# Patient Record
Sex: Female | Born: 1970 | Race: White | Hispanic: No | Marital: Single | Smoking: Former smoker
Health system: Southern US, Community
[De-identification: ages and names within clinical notes are randomized; demographics above are authoritative.]

## PROBLEM LIST (undated history)

## (undated) DIAGNOSIS — Z8049 Family history of malignant neoplasm of other genital organs: Secondary | ICD-10-CM

## (undated) DIAGNOSIS — Z8052 Family history of malignant neoplasm of bladder: Secondary | ICD-10-CM

## (undated) DIAGNOSIS — Z9889 Other specified postprocedural states: Secondary | ICD-10-CM

## (undated) DIAGNOSIS — E7800 Pure hypercholesterolemia, unspecified: Secondary | ICD-10-CM

## (undated) DIAGNOSIS — D0510 Intraductal carcinoma in situ of unspecified breast: Secondary | ICD-10-CM

## (undated) DIAGNOSIS — R112 Nausea with vomiting, unspecified: Secondary | ICD-10-CM

## (undated) DIAGNOSIS — R87629 Unspecified abnormal cytological findings in specimens from vagina: Secondary | ICD-10-CM

## (undated) DIAGNOSIS — E119 Type 2 diabetes mellitus without complications: Secondary | ICD-10-CM

## (undated) DIAGNOSIS — G709 Myoneural disorder, unspecified: Secondary | ICD-10-CM

## (undated) HISTORY — DX: Unspecified abnormal cytological findings in specimens from vagina: R87.629

## (undated) HISTORY — PX: MASTECTOMY: SHX3

## (undated) HISTORY — PX: WRIST SURGERY: SHX841

## (undated) HISTORY — PX: CRYOTHERAPY: SHX1416

## (undated) HISTORY — DX: Family history of malignant neoplasm of other genital organs: Z80.49

## (undated) HISTORY — DX: Intraductal carcinoma in situ of unspecified breast: D05.10

## (undated) HISTORY — DX: Family history of malignant neoplasm of bladder: Z80.52

---

## 2004-04-13 ENCOUNTER — Emergency Department: Payer: Self-pay | Admitting: Emergency Medicine

## 2005-02-22 ENCOUNTER — Emergency Department: Payer: Self-pay | Admitting: Unknown Physician Specialty

## 2005-03-24 ENCOUNTER — Emergency Department: Payer: Self-pay | Admitting: Emergency Medicine

## 2008-10-23 ENCOUNTER — Emergency Department: Payer: Self-pay | Admitting: Unknown Physician Specialty

## 2015-05-12 ENCOUNTER — Emergency Department: Payer: Self-pay

## 2015-05-12 ENCOUNTER — Encounter: Payer: Self-pay | Admitting: Emergency Medicine

## 2015-05-12 ENCOUNTER — Emergency Department
Admission: EM | Admit: 2015-05-12 | Discharge: 2015-05-12 | Disposition: A | Discharge status: Home / Self Care | Payer: Self-pay | Attending: Emergency Medicine | Admitting: Emergency Medicine

## 2015-05-12 DIAGNOSIS — F17200 Nicotine dependence, unspecified, uncomplicated: Secondary | ICD-10-CM | POA: Insufficient documentation

## 2015-05-12 DIAGNOSIS — R600 Localized edema: Secondary | ICD-10-CM | POA: Insufficient documentation

## 2015-05-12 DIAGNOSIS — R0609 Other forms of dyspnea: Secondary | ICD-10-CM | POA: Insufficient documentation

## 2015-05-12 DIAGNOSIS — R609 Edema, unspecified: Secondary | ICD-10-CM

## 2015-05-12 LAB — CBC
HCT: 36.9 % (ref 35.0–47.0)
Hemoglobin: 12.8 g/dL (ref 12.0–16.0)
MCH: 31.5 pg (ref 26.0–34.0)
MCHC: 34.7 g/dL (ref 32.0–36.0)
MCV: 90.6 fL (ref 80.0–100.0)
PLATELETS: 205 10*3/uL (ref 150–440)
RBC: 4.08 MIL/uL (ref 3.80–5.20)
RDW: 14 % (ref 11.5–14.5)
WBC: 8.3 10*3/uL (ref 3.6–11.0)

## 2015-05-12 LAB — BASIC METABOLIC PANEL
ANION GAP: 7 (ref 5–15)
BUN: 11 mg/dL (ref 6–20)
CALCIUM: 8.5 mg/dL — AB (ref 8.9–10.3)
CO2: 26 mmol/L (ref 22–32)
Chloride: 107 mmol/L (ref 101–111)
Creatinine, Ser: 0.86 mg/dL (ref 0.44–1.00)
GFR calc Af Amer: >60 mL/min (ref >60)
GLUCOSE: 104 mg/dL — AB (ref 65–99)
POTASSIUM: 3.5 mmol/L (ref 3.5–5.1)
SODIUM: 140 mmol/L (ref 135–145)

## 2015-05-12 LAB — BRAIN NATRIURETIC PEPTIDE: B NATRIURETIC PEPTIDE 5: 51 pg/mL (ref 0.0–100.0)

## 2015-05-12 MED ORDER — FUROSEMIDE 20 MG PO TABS: 20.0000 mg | ORAL_TABLET | Freq: Every day | ORAL | Status: DC

## 2015-05-12 NOTE — ED Notes (Signed)
Pt in with co bilat ankle swelling for few days worse to left leg also co of some shob.

## 2015-05-12 NOTE — Discharge Instructions (Signed)
Peripheral Edema You have swelling in your legs (peripheral edema). This swelling is due to excess accumulation of salt and water in your body. Edema may be a sign of heart, kidney or liver disease, or a side effect of a medication. It may also be due to problems in the leg veins. Elevating your legs and using special support stockings may be very helpful, if the cause of the swelling is due to poor venous circulation. Avoid long periods of standing, whatever the cause. Treatment of edema depends on identifying the cause. Chips, pretzels, pickles and other salty foods should be avoided. Restricting salt in your diet is almost always needed. Water pills (diuretics) are often used to remove the excess salt and water from your body via urine. These medicines prevent the kidney from reabsorbing sodium. This increases urine flow. Diuretic treatment may also result in lowering of potassium levels in your body. Potassium supplements may be needed if you have to use diuretics daily. Daily weights can help you keep track of your progress in clearing your edema. You should call your caregiver for follow up care as recommended. SEEK IMMEDIATE MEDICAL CARE IF:   You have increased swelling, pain, redness, or heat in your legs.  You develop shortness of breath, especially when lying down.  You develop chest or abdominal pain, weakness, or fainting.  You have a fever.   This information is not intended to replace advice given to you by your health care provider. Make sure you discuss any questions you have with your health care provider.   Document Released: 02/03/2004 Document Revised: 03/20/2011 Document Reviewed: 07/08/2014 Elsevier Interactive Patient Education 2016 [place] Shortness of breath means you have trouble breathing. It could also mean that you have a medical problem. You should get immediate medical care for shortness of breath. CAUSES   Not enough oxygen in the  air such as with high altitudes or a smoke-filled room.  Certain lung diseases, infections, or problems.  Heart disease or conditions, such as angina or heart failure.  Low red blood cells (anemia).  Poor physical fitness, which can cause shortness of breath when you exercise.  Chest or back injuries or stiffness.  Being overweight.  Smoking.  Anxiety, which can make you feel like you are not getting enough air. DIAGNOSIS  Serious medical problems can often be found during your physical exam. Tests may also be done to determine why you are having shortness of breath. Tests may include:  Chest X-rays.  Lung function tests.  Blood tests.  An electrocardiogram (ECG).  An ambulatory electrocardiogram. An ambulatory ECG records your heartbeat patterns over a 24-hour period.  Exercise testing.  A transthoracic echocardiogram (TTE). During echocardiography, sound waves are used to evaluate how blood flows through your heart.  A transesophageal echocardiogram (TEE).  Imaging scans. Your health care provider may not be able to find a cause for your shortness of breath after your exam. In this case, it is important to have a follow-up exam with your health care provider as directed.  TREATMENT  Treatment for shortness of breath depends on the cause of your symptoms and can vary greatly. HOME CARE INSTRUCTIONS   Do not smoke. Smoking is a common cause of shortness of breath. If you smoke, ask for help to quit.  Avoid being around chemicals or things that may bother your breathing, such as paint fumes and dust.  Rest as needed. Slowly resume your usual activities.  If medicines were  prescribed, take them as directed for the full length of time directed. This includes oxygen and any inhaled medicines.  Keep all follow-up appointments as directed by your health care provider. SEEK MEDICAL CARE IF:   Your condition does not improve in the time expected.  You have a hard time  doing your normal activities even with rest.  You have any new symptoms. SEEK IMMEDIATE MEDICAL CARE IF:   Your shortness of breath gets worse.  You feel light-headed, faint, or develop a cough not controlled with medicines.  You start coughing up blood.  You have pain with breathing.  You have chest pain or pain in your arms, shoulders, or abdomen.  You have a fever.  You are unable to walk up stairs or exercise the way you normally do. MAKE SURE YOU:  Understand these instructions.  Will watch your condition.  Will get help right away if you are not doing well or get worse.   This information is not intended to replace advice given to you by your health care provider. Make sure you discuss any questions you have with your health care provider.   Document Released: 09/20/2000 Document Revised: 12/31/2012 Document Reviewed: 03/13/2011 Elsevier Interactive Patient Education [employer].

## 2015-05-12 NOTE — ED Provider Notes (Signed)
[facility] Emergency Department Provider Note   ____________________________________________  Time seen: Approximately [id number] AM  I have reviewed the triage vital signs and the nursing notes.   HISTORY  Chief Complaint Leg Swelling    HPI Heather Cain is a 45 y.o. female who comes into the hospital today with swelling in her ankles the patient reports that the swelling is worse left than right. She also has some shooting pain in her legs. The patient did have some dizziness and shortness of breath today which made her concerned. The patient reports that she went on a camping trip this weekend. She reports that she cannot walk a lot and she denies injuring herself. She was okay on Friday but felt very tired on Saturday. She reports that she's had some swelling before but this is worse than it ever been. She reports that her pain is a 2 out of 10 and is not that bad. She does have some dyspnea on exertion but no shortness of breath when she lays down. She did drink this weekend which she reports she does not drink very often. The patient wants to find out what's going on today.   History reviewed. No pertinent past medical history.  There are no active problems to display for this patient.   Past Surgical History  Procedure Laterality Date  . Wrist surgery      Current Outpatient Rx  Name  Route  Sig  Dispense  Refill  . furosemide (LASIX) 20 MG tablet   Oral   Take 1 tablet (20 mg total) by mouth daily.   20 tablet   0     Allergies Codeine  No family history on file.  Social History Social History  Substance Use Topics  . Smoking status: Current Every Day Smoker  . Smokeless tobacco: None  . Alcohol Use: Yes    Review of Systems Constitutional: No fever/chills Eyes: No visual changes. ENT: No sore throat. Cardiovascular: Denies chest pain. Respiratory: shortness of breath. Gastrointestinal: No abdominal pain.  No nausea, no  vomiting.  No diarrhea.  No constipation. Genitourinary: Negative for dysuria. Musculoskeletal: Negative for back pain. Skin: Negative for rash. Neurological: dizziness Hematological/Lymphatic:Leg swelling  10-point ROS otherwise negative.  ____________________________________________   PHYSICAL EXAM:  VITAL SIGNS: ED Triage Vitals  Enc Vitals Group     BP 05/12/15 0419 140/88 mmHg     Pulse Rate 05/12/15 0419 102     Resp 05/12/15 0419 20     Temp 05/12/15 0419 97.7 F (36.5 C)     Temp Source 05/12/15 0419 Oral     SpO2 05/12/15 0419 98 %     Weight 05/12/15 0419 260 lb (117.935 kg)     Height 05/12/15 0419 5\' 3"  (1.6 m)     Head Cir --      Peak Flow --      Pain Score --      Pain Loc --      Pain Edu? --      Excl. in [place]? --     Constitutional: Alert and oriented. Well appearing and in no acute distress. Eyes: Conjunctivae are normal. PERRL. EOMI. Head: Atraumatic. Nose: No congestion/rhinnorhea. Mouth/Throat: Mucous membranes are moist.  Oropharynx non-erythematous. Cardiovascular: Normal rate, regular rhythm. Grossly normal heart sounds.  Good peripheral circulation. Respiratory: Normal respiratory effort.  No retractions. Lungs CTAB. Gastrointestinal: Soft and nontender. No distention. Positive bowel sounds Musculoskeletal: Bilateral lower extremity edema left greater than right  Neurologic:  Normal speech and language.  Skin:  Skin is warm, dry and intact.  Psychiatric: Mood and affect are normal.   ____________________________________________   LABS (all labs ordered are listed, but only abnormal results are displayed)  Labs Reviewed  BASIC METABOLIC PANEL - Abnormal; Notable for the following:    Glucose, Bld 104 (*)    Calcium 8.5 (*)    All other components within normal limits  CBC  BRAIN NATRIURETIC PEPTIDE   ____________________________________________  EKG  None ____________________________________________  RADIOLOGY  Left lower  extremity ultrasound: No evidence of deep venous thrombosis  Chest x-ray: No active cardiopulmonary disease. ____________________________________________   PROCEDURES  Procedure(s) performed: None  Critical Care performed: No  ____________________________________________   INITIAL IMPRESSION / ASSESSMENT AND PLAN / ED COURSE  Pertinent labs & imaging results that were available during my care of the patient were reviewed by me and considered in my medical decision making (see chart for details).  This is a 45 year old female who comes into the hospital today with some shortness of breath. She reports that she's had some leg swelling as well. I did check some heart failure labs given the patient's complaints. The patient's BNP is unremarkable, her chest x-ray does not show any pulmonary edema and the remainder of her blood work is unremarkable. I feel the patient has some peripheral edema and I will give her some Lasix to take at home. I will encourage the patient to follow-up at the acute care clinic for further evaluation of her symptoms especially if they're persistent. The patient understands and agrees with the plan as stated. She'll be discharged home. ____________________________________________   FINAL CLINICAL IMPRESSION(S) / ED DIAGNOSES  Final diagnoses:  Peripheral edema  Dyspnea on exertion      NEW MEDICATIONS STARTED DURING THIS VISIT:  Discharge Medication List as of 05/12/2015  7:25 AM    START taking these medications   Details  furosemide (LASIX) 20 MG tablet Take 1 tablet (20 mg total) by mouth daily., Starting 05/12/2015, Until Thu 05/11/16, Print         Note:  This document was prepared using Dragon voice recognition software and may include unintentional dictation errors.    Loney Hering, MD 05/12/15 [phone]

## 2015-05-12 NOTE — ED Notes (Signed)
Patient given info on medication management clinic, [facility] and [facility].  Patient is in no obvious distress at this time.

## 2020-05-05 ENCOUNTER — Other Ambulatory Visit (HOSPITAL_COMMUNITY)
Admission: RE | Admit: 2020-05-05 | Discharge: 2020-05-05 | Disposition: A | Discharge status: Home / Self Care | Payer: 59 | Source: Ambulatory Visit | Attending: Student | Admitting: Student

## 2020-05-05 ENCOUNTER — Other Ambulatory Visit: Payer: Self-pay

## 2020-05-05 ENCOUNTER — Encounter (INDEPENDENT_AMBULATORY_CARE_PROVIDER_SITE_OTHER): Payer: Self-pay

## 2020-05-05 ENCOUNTER — Encounter: Payer: Self-pay | Admitting: Student

## 2020-05-05 ENCOUNTER — Ambulatory Visit (INDEPENDENT_AMBULATORY_CARE_PROVIDER_SITE_OTHER): Payer: 59 | Admitting: Student

## 2020-05-05 VITALS — BP 114/79 | HR 96 | Ht 63.0 in | Wt 325.0 lb

## 2020-05-05 DIAGNOSIS — Z01419 Encounter for gynecological examination (general) (routine) without abnormal findings: Secondary | ICD-10-CM | POA: Insufficient documentation

## 2020-05-05 MED ORDER — HYDROCORTISONE (PERIANAL) 2.5 % EX CREA: 1.0000 "application " | TOPICAL_CREAM | Freq: Two times a day (BID) | CUTANEOUS | 0 refills | Status: DC

## 2020-05-05 NOTE — Patient Instructions (Addendum)
[facility] (part of [facility]); talk about colonoscopy and hemorroid  [phone]    Managing the Challenge of Quitting Smoking Quitting smoking is a physical and mental challenge. You will face cravings, withdrawal symptoms, and temptation. Before quitting, work with your health care provider to make a plan that can help you manage quitting. Preparation can help you quit and keep you from giving in. How to manage lifestyle changes Managing stress Stress can make you want to smoke, and wanting to smoke may cause stress. It is important to find ways to manage your stress. You might try some of the following:  Practice relaxation techniques. ? Breathe slowly and deeply, in through your nose and out through your mouth. ? Listen to music. ? Soak in a bath or take a shower. ? Imagine a peaceful place or vacation.  Get some support. ? Talk with family or friends about your stress. ? Join a support group. ? Talk with a counselor or therapist.  Get some physical activity. ? Go for a walk, run, or bike ride. ? Play a favorite sport. ? Practice yoga.   Medicines Talk with your health care provider about medicines that might help you deal with cravings and make quitting easier for you. Relationships Social situations can be difficult when you are quitting smoking. To manage this, you can:  Avoid parties and other social situations where people might be smoking.  Avoid alcohol.  Leave right away if you have the urge to smoke.  Explain to your family and friends that you are quitting smoking. Ask for support and let them know you might be a bit grumpy.  Plan activities where smoking is not an option. General instructions Be aware that many people gain weight after they quit smoking. However, not everyone does. To keep from gaining weight, have a plan in place before you quit and stick to the plan after you quit. Your plan should include:  Having healthy snacks. When you have a  craving, it may help to: ? Eat popcorn, carrots, celery, or other cut vegetables. ? Chew sugar-free gum.  Changing how you eat. ? Eat small portion sizes at meals. ? Eat 4-6 small meals throughout the day instead of 1-2 large meals a day. ? Be mindful when you eat. Do not watch television or do other things that might distract you as you eat.  Exercising regularly. ? Make time to exercise each day. If you do not have time for a long workout, do short bouts of exercise for 5-10 minutes several times a day. ? Do some form of strengthening exercise, such as weight lifting. ? Do some exercise that gets your heart beating and causes you to breathe deeply, such as walking fast, running, swimming, or biking. This is very important.  Drinking plenty of water or other low-calorie or no-calorie drinks. Drink 6-8 glasses of water daily.   How to recognize withdrawal symptoms Your body and mind may experience discomfort as you try to get used to not having nicotine in your system. These effects are called withdrawal symptoms. They may include:  Feeling hungrier than normal.  Having trouble concentrating.  Feeling irritable or restless.  Having trouble sleeping.  Feeling depressed.  Craving a cigarette. To manage withdrawal symptoms:  Avoid places, people, and activities that trigger your cravings.  Remember why you want to quit.  Get plenty of sleep.  Avoid coffee and other caffeinated drinks. These may worsen some of your symptoms. These symptoms may surprise you. But  be assured that they are normal to have when quitting smoking. How to manage cravings Come up with a plan for how to deal with your cravings. The plan should include the following:  A definition of the specific situation you want to deal with.  An alternative action you will take.  A clear idea for how this action will help.  The name of someone who might help you with this. Cravings usually last for 5-10 minutes.  Consider taking the following actions to help you with your plan to deal with cravings:  Keep your mouth busy. ? Chew sugar-free gum. ? Suck on hard candies or a straw. ? Brush your teeth.  Keep your hands and body busy. ? Change to a different activity right away. ? Squeeze or play with a ball. ? Do an activity or a hobby, such as making bead jewelry, practicing needlepoint, or working with wood. ? Mix up your normal routine. ? Take a short exercise break. Go for a quick walk or run up and down stairs.  Focus on doing something kind or helpful for someone else.  Call a friend or family member to talk during a craving.  Join a support group.  Contact a quitline. Where to find support To get help or find a support group:  Call the [employer]'s Smoking Quitline: 1-800-QUIT NOW (520)332-9829)  Visit the website of the Substance Abuse and [place]: ktimeonline.com  Text QUIT to SmokefreeTXT: 250539 Where to find more information Visit these websites to find more information on quitting smoking:  [place]: www.smokefree.gov  American Lung Association: www.lung.org  American Cancer Society: www.cancer.org  Centers for Disease Control and Prevention: http://www.wolf.info/  American Heart Association: www.heart.org Contact a health care provider if:  You want to change your plan for quitting.  The medicines you are taking are not helping.  Your eating feels out of control or you cannot sleep. Get help right away if:  You feel depressed or become very anxious. Summary  Quitting smoking is a physical and mental challenge. You will face cravings, withdrawal symptoms, and temptation to smoke again. Preparation can help you as you go through these challenges.  Try different techniques to manage stress, handle social situations, and prevent weight gain.  You can deal with cravings by keeping your mouth busy (such as by chewing  gum), keeping your hands and body busy, calling family or friends, or contacting a quitline for people who want to quit smoking.  You can deal with withdrawal symptoms by avoiding places where people smoke, getting plenty of rest, and avoiding drinks with caffeine. This information is not intended to replace advice given to you by your health care provider. Make sure you discuss any questions you have with your health care provider. Document Revised: 10/15/2018 Document Reviewed: 10/15/2018 Elsevier Patient Education  [place].

## 2020-05-05 NOTE — Progress Notes (Signed)
  History:  [name] is a 50 y.o. G1P0010 who presents to clinic today for gyn. She hasn't seen a gynecologist since 2010. She had cryotherapy when she was  Teenager. She denies any diagnosis of HTN, diabetes.   The following portions of the patient's history were reviewed and updated as appropriate: allergies, current medications, family history, past medical history, social history, past surgical history and problem list.  Review of Systems:  Review of Systems  Constitutional: Negative.   HENT: Negative.   Respiratory: Negative.   Cardiovascular: Negative.   Genitourinary: Negative.   Skin: Negative.   Neurological: Negative.   Psychiatric/Behavioral: Negative.       Objective:  Physical Exam BP 114/79   Pulse 96   Ht 5\' 3"  (1.6 m)   Wt (!) 325 lb (147.4 kg)   LMP 03/27/2020 (Exact Date)   BMI 57.57 kg/m  Physical Exam HENT:     Head: Normocephalic.  Cardiovascular:     Rate and Rhythm: Normal rate.  Abdominal:     General: Abdomen is flat.  Genitourinary:    General: Normal vulva.     Vagina: No vaginal discharge.     Rectum: Normal.  Musculoskeletal:        General: Normal range of motion.     Cervical back: Normal range of motion.  Skin:    General: Skin is warm and dry.  Neurological:     Mental Status: She is alert.    Breasts are soft, non-tender, no masses, no lumps palpated One small hemorroid noted; pink, non-tender, does not appear thrombosed   Labs and Imaging No results found for this or any previous visit (from the past 24 hour(s)).  No results found.   Assessment & Plan:   1. Well woman exam with routine gynecological exam    -information on smoking cessation given -mammo ordered -she declines STI screening -pap today -patient given number for [facility] PCP for follow up for hemorroid, colonoscopy and further care -Patient given anusol for hemorroids but advised that she may need to be seen by general surgery Approximately 30  minutes of total time was spent with this patient on exam and counseling/coordination of care.   [place], [place] 05/05/2020 3:01 PM

## 2020-05-06 LAB — COMPREHENSIVE METABOLIC PANEL
ALT: 14 IU/L (ref 0–32)
AST: 10 IU/L (ref 0–40)
Albumin/Globulin Ratio: 1.4 (ref 1.2–2.2)
Albumin: 3.9 g/dL (ref 3.8–4.8)
Alkaline Phosphatase: 109 IU/L (ref 44–121)
BUN/Creatinine Ratio: 12 (ref 9–23)
BUN: 11 mg/dL (ref 6–24)
Bilirubin Total: 0.2 mg/dL (ref 0.0–1.2)
CO2: 21 mmol/L (ref 20–29)
Calcium: 8.8 mg/dL (ref 8.7–10.2)
Chloride: 102 mmol/L (ref 96–106)
Creatinine, Ser: 0.95 mg/dL (ref 0.57–1.00)
Globulin, Total: 2.8 g/dL (ref 1.5–4.5)
Glucose: 138 mg/dL — ABNORMAL HIGH (ref 65–99)
Potassium: 4.3 mmol/L (ref 3.5–5.2)
Sodium: 141 mmol/L (ref 134–144)
Total Protein: 6.7 g/dL (ref 6.0–8.5)
eGFR: 73 mL/min/{1.73_m2} (ref >59)

## 2020-05-06 LAB — LIPID PANEL
Chol/HDL Ratio: 6.1 ratio — ABNORMAL HIGH (ref 0.0–4.4)
Cholesterol, Total: 183 mg/dL (ref 100–199)
HDL: 30 mg/dL — ABNORMAL LOW (ref >39)
LDL Chol Calc (NIH): 90 mg/dL (ref 0–99)
Triglycerides: 378 mg/dL — ABNORMAL HIGH (ref 0–149)
VLDL Cholesterol Cal: 63 mg/dL — ABNORMAL HIGH (ref 5–40)

## 2020-05-06 LAB — CBC
Hematocrit: 42.4 % (ref 34.0–46.6)
Hemoglobin: 14.6 g/dL (ref 11.1–15.9)
MCH: 31.1 pg (ref 26.6–33.0)
MCHC: 34.4 g/dL (ref 31.5–35.7)
MCV: 90 fL (ref 79–97)
Platelets: 239 10*3/uL (ref 150–450)
RBC: 4.69 x10E6/uL (ref 3.77–5.28)
RDW: 13.8 % (ref 11.7–15.4)
WBC: 7.8 10*3/uL (ref 3.4–10.8)

## 2020-05-06 LAB — TSH: TSH: 1.85 u[IU]/mL (ref 0.450–4.500)

## 2020-05-06 LAB — HEMOGLOBIN A1C
Est. average glucose Bld gHb Est-mCnc: 154 mg/dL
Hgb A1c MFr Bld: 7 % — ABNORMAL HIGH (ref 4.8–5.6)

## 2020-05-10 LAB — CYTOLOGY - PAP
Comment: NEGATIVE
Diagnosis: UNDETERMINED — AB
High risk HPV: NEGATIVE

## 2020-05-11 ENCOUNTER — Ambulatory Visit
Admission: RE | Admit: 2020-05-11 | Discharge: 2020-05-11 | Disposition: A | Discharge status: Home / Self Care | Payer: 59 | Source: Ambulatory Visit | Attending: Student | Admitting: Student

## 2020-05-11 ENCOUNTER — Other Ambulatory Visit: Payer: Self-pay

## 2020-05-11 DIAGNOSIS — R921 Mammographic calcification found on diagnostic imaging of breast: Secondary | ICD-10-CM | POA: Diagnosis not present

## 2020-05-11 DIAGNOSIS — Z1231 Encounter for screening mammogram for malignant neoplasm of breast: Secondary | ICD-10-CM | POA: Diagnosis present

## 2020-05-11 DIAGNOSIS — Z01419 Encounter for gynecological examination (general) (routine) without abnormal findings: Secondary | ICD-10-CM

## 2020-05-12 ENCOUNTER — Other Ambulatory Visit: Payer: Self-pay | Admitting: Student

## 2020-05-12 ENCOUNTER — Encounter: Payer: Self-pay | Admitting: Student

## 2020-05-12 DIAGNOSIS — N6489 Other specified disorders of breast: Secondary | ICD-10-CM

## 2020-05-12 DIAGNOSIS — R921 Mammographic calcification found on diagnostic imaging of breast: Secondary | ICD-10-CM

## 2020-05-12 DIAGNOSIS — E785 Hyperlipidemia, unspecified: Secondary | ICD-10-CM | POA: Insufficient documentation

## 2020-05-12 DIAGNOSIS — R928 Other abnormal and inconclusive findings on diagnostic imaging of breast: Secondary | ICD-10-CM

## 2020-05-12 DIAGNOSIS — E119 Type 2 diabetes mellitus without complications: Secondary | ICD-10-CM | POA: Insufficient documentation

## 2020-05-12 DIAGNOSIS — E782 Mixed hyperlipidemia: Secondary | ICD-10-CM | POA: Insufficient documentation

## 2020-05-18 ENCOUNTER — Encounter: Payer: Self-pay | Admitting: Internal Medicine

## 2020-05-18 ENCOUNTER — Ambulatory Visit (INDEPENDENT_AMBULATORY_CARE_PROVIDER_SITE_OTHER): Payer: 59 | Admitting: Internal Medicine

## 2020-05-18 ENCOUNTER — Other Ambulatory Visit: Payer: Self-pay

## 2020-05-18 VITALS — BP 132/90 | HR 87 | Ht 60.0 in | Wt 324.9 lb

## 2020-05-18 DIAGNOSIS — R928 Other abnormal and inconclusive findings on diagnostic imaging of breast: Secondary | ICD-10-CM

## 2020-05-18 DIAGNOSIS — E66813 Obesity, class 3: Secondary | ICD-10-CM | POA: Insufficient documentation

## 2020-05-18 DIAGNOSIS — Z6841 Body Mass Index (BMI) 40.0 and over, adult: Secondary | ICD-10-CM | POA: Diagnosis not present

## 2020-05-18 DIAGNOSIS — E782 Mixed hyperlipidemia: Secondary | ICD-10-CM

## 2020-05-18 DIAGNOSIS — Z1211 Encounter for screening for malignant neoplasm of colon: Secondary | ICD-10-CM

## 2020-05-18 DIAGNOSIS — J329 Chronic sinusitis, unspecified: Secondary | ICD-10-CM | POA: Diagnosis not present

## 2020-05-18 DIAGNOSIS — E119 Type 2 diabetes mellitus without complications: Secondary | ICD-10-CM

## 2020-05-18 LAB — GLUCOSE, POCT (MANUAL RESULT ENTRY): POC Glucose: 183 mg/dl — AB (ref 70–99)

## 2020-05-18 LAB — POC COVID19 BINAXNOW: SARS Coronavirus 2 Ag: NEGATIVE

## 2020-05-18 NOTE — Assessment & Plan Note (Signed)
-   The patient's blood sugar is elevated on no medication - The patient will continue the current treatment regimen.  Started on metformin and antidiabetic diet patient was advised to lose weight. - I encouraged the patient to regularly check blood sugar.  Given prescription for glucometer - I encouraged the patient to monitor diet. I encouraged the patient to eat low-carb and low-sugar to help prevent blood sugar spikes.  - I encouraged the patient to continue following their prescribed treatment plan for diabetes - I informed the patient to get help if blood sugar drops below 54mg /dL, or if suddenly have trouble thinking clearly or breathing.

## 2020-05-18 NOTE — Progress Notes (Signed)
New Patient Office Visit  Subjective:  Patient ID: Heather Cain, female    DOB: Jan 07, 1971  Age: 50 y.o. MRN: [id number]  CC:  Chief Complaint  Patient presents with  . New Patient (Initial Visit)    HPI Patient presents for  As new pt, patient has a problem with exogenous obesity swelling of the left leg chronic dermatitis of the left leg.  She denies any chest pain or shortness of breath.  She is due for colonoscopy mammography has a history of hemorrhagic.  She  does not drink.  Denies any history of chest pain with exertion.  Patient has been advised to quit smoking.  She was also found to have new onset diabetes started on metformin.  Ordered a glucometer so she can check her blood sugar at home.  Past Medical History:  Diagnosis Date  . Vaginal Pap smear, abnormal      Current Outpatient Medications:  .  fluticasone (FLONASE) 50 MCG/ACT nasal spray, Place into both nostrils daily., Disp: , Rfl:  .  loratadine (CLARITIN) 10 MG tablet, Take 10 mg by mouth daily., Disp: , Rfl:    Past Surgical History:  Procedure Laterality Date  . CRYOTHERAPY    . WRIST SURGERY      Family History  Problem Relation Age of Onset  . Ovarian cancer Mother   . Breast cancer Maternal Grandmother   . Breast cancer Paternal Grandmother     Social History   Socioeconomic History  . Marital status: Single    Spouse name: Not on file  . Number of children: Not on file  . Years of education: Not on file  . Highest education level: Not on file  Occupational History  . Not on file  Tobacco Use  . Smoking status: Current Every Day Smoker  . Smokeless tobacco: Never Used  Vaping Use  . Vaping Use: Some days  Substance and Sexual Activity  . Alcohol use: Yes  . Drug use: Not Currently  . Sexual activity: Not Currently    Birth control/protection: None  Other Topics Concern  . Not on file  Social History Narrative  . Not on file   Social Determinants of Health   Financial  Resource Strain: Not on file  Food Insecurity: Not on file  Transportation Needs: Not on file  Physical Activity: Not on file  Stress: Not on file  Social Connections: Not on file  Intimate Partner Violence: Not on file    ROS Review of Systems  Constitutional: Negative.  Negative for chills and fatigue.  HENT: Negative.  Negative for ear pain.   Eyes: Negative.  Negative for visual disturbance.  Respiratory: Negative.  Negative for choking and chest tightness.   Cardiovascular: Negative.  Negative for chest pain.  Gastrointestinal: Negative.  Negative for blood in stool.  Endocrine: Negative.   Genitourinary: Negative.  Negative for pelvic pain and urgency.  Musculoskeletal: Negative.   Skin: Positive for color change.  Allergic/Immunologic: Negative.   Neurological: Negative.   Hematological: Negative.  Negative for adenopathy.  Psychiatric/Behavioral: Negative.   All other systems reviewed and are negative.   Objective:   Today's Vitals: BP 132/90   Pulse 87   Ht 5' (1.524 m)   Wt (!) 324 lb 14.4 oz (147.4 kg)   BMI 63.45 kg/m   Physical Exam HENT:     Head: Normocephalic.     Right Ear: There is no impacted cerumen.     Left Ear: Tympanic membrane  normal. There is no impacted cerumen.     Ears:     Comments: There is fluid behind the right ear tympanic membrane Cardiovascular:     Rate and Rhythm: Normal rate and regular rhythm.     Heart sounds: No murmur heard.   Pulmonary:     Effort: Pulmonary effort is normal.  Abdominal:     General: There is distension.     Tenderness: There is no abdominal tenderness. There is no guarding.  Neurological:     General: No focal deficit present.     Mental Status: She is oriented to person, place, and time.  Psychiatric:        Mood and Affect: Mood normal.     Assessment & Plan:   Problem List Items Addressed This Visit      Respiratory   Sinusitis    Patient was given Z-Pak and Claritin 10 mg p.o. daily       Relevant Orders   POC COVID-19 (Completed)     Endocrine   Diabetes ([employer]) - Primary    - The patient's blood sugar is elevated on no medication - The patient will continue the current treatment regimen.  Started on metformin and antidiabetic diet patient was advised to lose weight. - I encouraged the patient to regularly check blood sugar.  Given prescription for glucometer - I encouraged the patient to monitor diet. I encouraged the patient to eat low-carb and low-sugar to help prevent blood sugar spikes.  - I encouraged the patient to continue following their prescribed treatment plan for diabetes - I informed the patient to get help if blood sugar drops below 54mg /dL, or if suddenly have trouble thinking clearly or breathing.       Relevant Orders   POCT glucose (manual entry) (Completed)     Other   Elevated triglycerides with high cholesterol    Patient was advised to read the book on diabetes on low-carb antidiabetic diet on the Internet she definitely need to lose weight.  She was also started on Crestor 20 mg p.o. daily      Abnormality of breast on screening mammography    Advise annual mammogram      Class 3 severe obesity due to excess calories with serious comorbidity and body mass index (BMI) of 60.0 to 69.9 in adult [facility])    - I encouraged the patient to lose weight.  - I educated them on making healthy dietary choices including eating more fruits and vegetables and less fried foods. - I encouraged the patient to exercise more, and educated on the benefits of exercise including weight loss, diabetes prevention, and hypertension prevention.   Dietary counseling with a registered dietician  Referral to a weight management support group (e.g. Weight Watchers, Overeaters Anonymous)  If your BMI is greater than 29 or you have gained more than 15 pounds you should work on weight loss.  Attend a healthy cooking class          Outpatient Encounter Medications as of  05/18/2020  Medication Sig  . fluticasone (FLONASE) 50 MCG/ACT nasal spray Place into both nostrils daily.  Marland Kitchen loratadine (CLARITIN) 10 MG tablet Take 10 mg by mouth daily.  . [DISCONTINUED] furosemide (LASIX) 20 MG tablet Take 1 tablet (20 mg total) by mouth daily.  . [DISCONTINUED] hydrocortisone (ANUSOL-HC) 2.5 % rectal cream Place 1 application rectally 2 (two) times daily.   No facility-administered encounter medications on file as of 05/18/2020.    Follow-up: No  follow-ups on file.   Cletis Athens, MD

## 2020-05-18 NOTE — Assessment & Plan Note (Signed)
Patient was advised to read the book on diabetes on low-carb antidiabetic diet on the Internet she definitely need to lose weight.  She was also started on Crestor 20 mg p.o. daily

## 2020-05-18 NOTE — Assessment & Plan Note (Signed)
Patient was given Z-Pak and Claritin 10 mg p.o. daily

## 2020-05-18 NOTE — Assessment & Plan Note (Signed)
Advise annual mammogram

## 2020-05-18 NOTE — Assessment & Plan Note (Signed)

## 2020-05-20 NOTE — Addendum Note (Signed)
Addended by: Lacretia Nicks L on: 05/20/2020 09:30 AM   Modules accepted: Orders

## 2020-05-21 ENCOUNTER — Ambulatory Visit
Admission: RE | Admit: 2020-05-21 | Discharge: 2020-05-21 | Disposition: A | Discharge status: Home / Self Care | Payer: 59 | Source: Ambulatory Visit | Attending: Student | Admitting: Student

## 2020-05-21 ENCOUNTER — Other Ambulatory Visit: Payer: Self-pay

## 2020-05-21 DIAGNOSIS — N6489 Other specified disorders of breast: Secondary | ICD-10-CM | POA: Insufficient documentation

## 2020-05-21 DIAGNOSIS — R928 Other abnormal and inconclusive findings on diagnostic imaging of breast: Secondary | ICD-10-CM

## 2020-05-21 DIAGNOSIS — R921 Mammographic calcification found on diagnostic imaging of breast: Secondary | ICD-10-CM

## 2020-05-21 MED ORDER — GLIMEPIRIDE 2 MG PO TABS: 2.0000 mg | ORAL_TABLET | Freq: Every day | ORAL | 1 refills | Status: DC

## 2020-05-25 ENCOUNTER — Other Ambulatory Visit: Payer: Self-pay | Admitting: Internal Medicine

## 2020-05-28 ENCOUNTER — Ambulatory Visit: Payer: 59 | Admitting: Family Medicine

## 2020-06-03 ENCOUNTER — Encounter: Payer: Self-pay | Admitting: Family Medicine

## 2020-06-03 ENCOUNTER — Ambulatory Visit (INDEPENDENT_AMBULATORY_CARE_PROVIDER_SITE_OTHER): Payer: 59 | Admitting: Family Medicine

## 2020-06-03 ENCOUNTER — Other Ambulatory Visit: Payer: Self-pay

## 2020-06-03 VITALS — BP 118/83 | HR 84 | Ht 60.0 in | Wt 325.2 lb

## 2020-06-03 DIAGNOSIS — Z6841 Body Mass Index (BMI) 40.0 and over, adult: Secondary | ICD-10-CM

## 2020-06-03 DIAGNOSIS — E1169 Type 2 diabetes mellitus with other specified complication: Secondary | ICD-10-CM | POA: Diagnosis not present

## 2020-06-03 DIAGNOSIS — E119 Type 2 diabetes mellitus without complications: Secondary | ICD-10-CM

## 2020-06-03 NOTE — Progress Notes (Signed)
Established Patient Office Visit  SUBJECTIVE:  Subjective  Patient ID: Heather Cain, female    DOB: 08/17/1970  Age: 50 y.o. MRN: [id number]  CC:  Chief Complaint  Patient presents with  . Diabetes    10 day follow up    HPI  Heather Cain is a 50 y.o. female presenting today for DM fu after starting Glimepiride.   Past Medical History:  Diagnosis Date  . Vaginal Pap smear, abnormal     Past Surgical History:  Procedure Laterality Date  . CRYOTHERAPY    . WRIST SURGERY      Family History  Problem Relation Age of Onset  . Ovarian cancer Mother   . Breast cancer Maternal Grandmother   . Breast cancer Paternal Grandmother     Social History   Socioeconomic History  . Marital status: Single    Spouse name: Not on file  . Number of children: Not on file  . Years of education: Not on file  . Highest education level: Not on file  Occupational History  . Not on file  Tobacco Use  . Smoking status: Current Every Day Smoker  . Smokeless tobacco: Never Used  Vaping Use  . Vaping Use: Some days  Substance and Sexual Activity  . Alcohol use: Yes  . Drug use: Not Currently  . Sexual activity: Not Currently    Birth control/protection: None  Other Topics Concern  . Not on file  Social History Narrative  . Not on file   Social Determinants of Health   Financial Resource Strain: Not on file  Food Insecurity: Not on file  Transportation Needs: Not on file  Physical Activity: Not on file  Stress: Not on file  Social Connections: Not on file  Intimate Partner Violence: Not on file     Current Outpatient Medications:  .  fluticasone (FLONASE) 50 MCG/ACT nasal spray, Place into both nostrils daily., Disp: , Rfl:  .  glimepiride (AMARYL) 2 MG tablet, Take 1 tablet (2 mg total) by mouth daily before breakfast., Disp: 30 tablet, Rfl: 1 .  loratadine (CLARITIN) 10 MG tablet, Take 10 mg by mouth daily., Disp: , Rfl:    Allergies  Allergen Reactions  .  Codeine Itching    ROS Review of Systems  Constitutional: Negative.   HENT: Negative.   Respiratory: Negative.   Cardiovascular: Negative.   Gastrointestinal: Negative.   Musculoskeletal: Negative.   Skin: Negative.   Psychiatric/Behavioral: Negative.      OBJECTIVE:    Physical Exam Vitals and nursing note reviewed.  Constitutional:      Appearance: She is obese.  HENT:     Head: Normocephalic.     Nose: Nose normal.     Mouth/Throat:     Mouth: Mucous membranes are moist.  Cardiovascular:     Rate and Rhythm: Normal rate and regular rhythm.  Musculoskeletal:     Cervical back: Normal range of motion.  Skin:    General: Skin is warm.     BP 118/83   Pulse 84   Ht 5' (1.524 m)   Wt (!) 325 lb 3.2 oz (147.5 kg)   BMI 63.51 kg/m  Wt Readings from Last 3 Encounters:  06/03/20 (!) 325 lb 3.2 oz (147.5 kg)  05/18/20 (!) 324 lb 14.4 oz (147.4 kg)  05/05/20 (!) 325 lb (147.4 kg)    Health Maintenance Due  Topic Date Due  . PNEUMOCOCCAL POLYSACCHARIDE VACCINE AGE 68-64 HIGH RISK  Never done  .  FOOT EXAM  Never done  . OPHTHALMOLOGY EXAM  Never done  . URINE MICROALBUMIN  Never done  . HIV Screening  Never done  . Hepatitis C Screening  Never done  . TETANUS/TDAP  Never done  . COLONOSCOPY (Pts 45-71yr Insurance coverage will need to be confirmed)  Never done  . COVID-19 Vaccine (3 - Booster for Moderna series) 09/15/2019    There are no preventive care reminders to display for this patient.  CBC Latest Ref Rng & Units 05/05/2020 05/12/2015  WBC 3.4 - 10.8 x10E3/uL 7.8 8.3  Hemoglobin 11.1 - 15.9 g/dL 14.6 12.8  Hematocrit 34.0 - 46.6 % 42.4 36.9  Platelets 150 - 450 x10E3/uL 239 205   CMP Latest Ref Rng & Units 05/05/2020 05/12/2015  Glucose 65 - 99 mg/dL 138(H) 104(H)  BUN 6 - 24 mg/dL 11 11  Creatinine 0.57 - 1.00 mg/dL 0.95 0.86  Sodium 134 - 144 mmol/L 141 140  Potassium 3.5 - 5.2 mmol/L 4.3 3.5  Chloride 96 - 106 mmol/L 102 107  CO2 20 - 29 mmol/L 21  26  Calcium 8.7 - 10.2 mg/dL 8.8 8.5(L)  Total Protein 6.0 - 8.5 g/dL 6.7 -  Total Bilirubin 0.0 - 1.2 mg/dL 0.2 -  Alkaline Phos 44 - 121 IU/L 109 -  AST 0 - 40 IU/L 10 -  ALT 0 - 32 IU/L 14 -    Lab Results  Component Value Date   TSH 1.850 05/05/2020   Lab Results  Component Value Date   ALBUMIN 3.9 05/05/2020   ANIONGAP 7 05/12/2015   EGFR 73 05/05/2020   Lab Results  Component Value Date   CHOL 183 05/05/2020   HDL 30 (L) 05/05/2020   LDLCALC 90 05/05/2020   CHOLHDL 6.1 (H) 05/05/2020   Lab Results  Component Value Date   TRIG 378 (H) 05/05/2020   Lab Results  Component Value Date   HGBA1C 7.0 (H) 05/05/2020      ASSESSMENT & PLAN:   Problem List Items Addressed This Visit      Endocrine   Diabetes ([facility] - Primary    10 days after starting Glimepiride her glucose reading have been between 100-130. No side effects. No low glucose readings. Will have her f/u in 2.5 months for repeat A1C.         Other   Class 3 severe obesity due to excess calories with serious comorbidity and body mass index (BMI) of 60.0 to 69.9 in adult ([facility]    Patient is currently counting carbs and watching all glucose intake. We discussed Mediterranean Diet and Gastric Sleeve.           No orders of the defined types were placed in this encounter.    Follow-up: No follow-ups on file.    [place]ie Salts [id number] [place] [id number]

## 2020-06-03 NOTE — Assessment & Plan Note (Signed)
Patient is currently counting carbs and watching all glucose intake. We discussed Mediterranean Diet and Gastric Sleeve.

## 2020-06-03 NOTE — Assessment & Plan Note (Signed)
10 days after starting Glimepiride her glucose reading have been between 100-130. No side effects. No low glucose readings. Will have her f/u in 2.5 months for repeat A1C.

## 2020-06-15 ENCOUNTER — Encounter: Payer: Self-pay | Admitting: *Deleted

## 2020-07-07 ENCOUNTER — Telehealth: Payer: 59 | Admitting: Physician Assistant

## 2020-07-07 DIAGNOSIS — U071 COVID-19: Secondary | ICD-10-CM

## 2020-07-07 MED ORDER — BENZONATATE 100 MG PO CAPS: 100.0000 mg | ORAL_CAPSULE | Freq: Three times a day (TID) | ORAL | 0 refills | Status: DC | PRN

## 2020-07-07 MED ORDER — ALBUTEROL SULFATE HFA 108 (90 BASE) MCG/ACT IN AERS: 2.0000 | INHALATION_SPRAY | Freq: Four times a day (QID) | RESPIRATORY_TRACT | 0 refills | Status: DC | PRN

## 2020-07-07 NOTE — Progress Notes (Signed)
E-Visit  for Positive Covid Test Result  We are sorry you are not feeling well. We are here to help!  You have tested positive for COVID-19, meaning that you were infected with the novel coronavirus and could give the virus to others.  It is vitally important that you stay home so you do not spread it to others.      Please continue isolation at home, for at least 10 days since the start of your symptoms and until you have had 24 hours with no fever (without taking a fever reducer) and with improving of symptoms.  If you have no symptoms but tested positive (or all symptoms resolve after 5 days and you have no fever) you can leave your house but continue to wear a mask around others for an additional 5 days. If you have a fever,continue to stay home until you have had 24 hours of no fever. Most cases improve 5-10 days from onset but we have seen a small number of patients who have gotten worse after the 10 days.  Please be sure to watch for worsening symptoms and remain taking the proper precautions.   Go to the nearest hospital ED for assessment if fever/cough/breathlessness are severe or illness seems like a threat to life.    The following symptoms may appear 2-14 days after exposure: Fever Cough Shortness of breath or difficulty breathing Chills Repeated shaking with chills Muscle pain Headache Sore throat New loss of taste or smell Fatigue Congestion or runny nose Nausea or vomiting Diarrhea  You have been enrolled in [facility] for COVID-19. Daily you will receive a questionnaire within the [facility] website. Our COVID-19 response team will be monitoring your responses daily.  You can use medication such as prescription cough medication called Tessalon Perles 100 mg. You may take 1-2 capsules every 8 hours as needed for cough and  prescription inhaler called Albuterol MDI 90 mcg /actuation 2 puffs every 4 hours as needed for shortness of breath, wheezing,  cough  You may also take acetaminophen (Tylenol) as needed for fever.  Giving your status as a diabetic you would be considered a potential candidate for antiviral medication. In order to be considered for this, you will need to submit a video visit so things can be reviewed in detail with you by a provider including antiviral options, pros/cons and potential side effects so you can make an educated decision about whether or not to proceed with these.   HOME CARE: Only take medications as instructed by your medical team. Drink plenty of fluids and get plenty of rest. A steam or ultrasonic humidifier can help if you have congestion.   GET HELP RIGHT AWAY IF YOU HAVE EMERGENCY WARNING SIGNS.  Call 911 or proceed to your closest emergency facility if: You develop worsening high fever. Trouble breathing Bluish lips or face Persistent pain or pressure in the chest New confusion Inability to wake or stay awake You cough up blood. Your symptoms become more severe Inability to hold down food or fluids  This list is not all possible symptoms. Contact your medical provider for any symptoms that are severe or concerning to you.    Your e-visit answers were reviewed by a board certified advanced clinical practitioner to complete your personal care plan.  Depending on the condition, your plan could have included both over the counter or prescription medications.  If there is a problem please reply once you have received a response from your  provider.  Your safety is important to [place].  If you have drug allergies check your prescription carefully.    You can use MyChart to ask questions about today's visit, request a non-urgent call back, or ask for a work or school excuse for 24 hours related to this e-Visit. If it has been greater than 24 hours you will need to follow up with your provider, or enter a new e-Visit to address those concerns. You will get an e-mail in the next two days asking about  your experience.  I hope that your e-visit has been valuable and will speed your recovery. Thank you for using e-visits.

## 2020-07-07 NOTE — Progress Notes (Signed)
I have spent 5 minutes in review of e-visit questionnaire, review and updating patient chart, medical decision making and response to patient.   Thadd Apuzzo Cody Ozan Maclay, PA-C    

## 2020-07-08 ENCOUNTER — Encounter: Payer: Self-pay | Admitting: Physician Assistant

## 2020-07-08 ENCOUNTER — Telehealth: Payer: 59 | Admitting: Physician Assistant

## 2020-07-08 ENCOUNTER — Telehealth: Payer: Self-pay | Admitting: Internal Medicine

## 2020-07-08 DIAGNOSIS — U071 COVID-19: Secondary | ICD-10-CM

## 2020-07-08 MED ORDER — IPRATROPIUM BROMIDE 0.03 % NA SOLN: 2.0000 | Freq: Two times a day (BID) | NASAL | 12 refills | Status: DC

## 2020-07-08 MED ORDER — MOLNUPIRAVIR EUA 200MG CAPSULE: 4.0000 | ORAL_CAPSULE | Freq: Two times a day (BID) | ORAL | 0 refills | Status: AC

## 2020-07-08 NOTE — Patient Instructions (Signed)
Heather Cain, thank you for joining Mar Daring, PA-C for today's virtual visit.  While this provider is not your primary care provider (PCP), if your PCP is located in our provider database this encounter information will be shared with them immediately following your visit.  Consent: (Patient) Heather Cain provided verbal consent for this virtual visit at the beginning of the encounter.  Current Medications:  Current Outpatient Medications:    ipratropium (ATROVENT) 0.03 % nasal spray, Place 2 sprays into both nostrils every 12 (twelve) hours., Disp: 30 mL, Rfl: 12   molnupiravir EUA 200 mg CAPS, Take 4 capsules (800 mg total) by mouth 2 (two) times daily for 5 days., Disp: 40 capsule, Rfl: 0   albuterol (VENTOLIN HFA) 108 (90 Base) MCG/ACT inhaler, Inhale 2 puffs into the lungs every 6 (six) hours as needed for wheezing or shortness of breath., Disp: 8 g, Rfl: 0   benzonatate (TESSALON) 100 MG capsule, Take 1 capsule (100 mg total) by mouth 3 (three) times daily as needed for cough., Disp: 30 capsule, Rfl: 0   fluticasone (FLONASE) 50 MCG/ACT nasal spray, Place into both nostrils daily., Disp: , Rfl:    glimepiride (AMARYL) 2 MG tablet, Take 1 tablet (2 mg total) by mouth daily before breakfast., Disp: 30 tablet, Rfl: 1   loratadine (CLARITIN) 10 MG tablet, Take 10 mg by mouth daily., Disp: , Rfl:    Medications ordered in this encounter:  Meds ordered this encounter  Medications   ipratropium (ATROVENT) 0.03 % nasal spray    Sig: Place 2 sprays into both nostrils every 12 (twelve) hours.    Dispense:  30 mL    Refill:  12    Order Specific Question:   Supervising Provider    Answer:   Sabra Heck, BRIAN [[id number]]   molnupiravir EUA 200 mg CAPS    Sig: Take 4 capsules (800 mg total) by mouth 2 (two) times daily for 5 days.    Dispense:  40 capsule    Refill:  0    Order Specific Question:   Supervising Provider    Answer:   Sabra Heck, [place]     *If you need refills on  other medications prior to your next appointment, please contact your pharmacy*  Follow-Up: Call back or seek an in-person evaluation if the symptoms worsen or if the condition fails to improve as anticipated.  If you have been instructed to have an in-person evaluation today at a local Urgent Care facility, please use the link below. It will take you to a list of all of our available [facility], including address, phone number and hours of operation. Please do not delay care.  [facility]  If you or a family member do not have a primary care provider, use the link below to schedule a visit and establish care. When you choose a [facility] primary care physician or advanced practice provider, you gain a long-term partner in health. Find a Primary Care Provider  Learn more about [facility] in-office and virtual care options: [place] are being prescribed MOLNUPIRAVIR for COVID-19 infection.   Please pick up your prescription at: [place] called into [address]   Please call the pharmacy or go through the drive through vs going inside if you are picking up the mediation yourself to prevent further spread. If prescribed to a [facility] affiliated pharmacy, a pharmacist will bring  the medication out to your car.   ADMINISTRATION INSTRUCTIONS: Take with or without food. Swallow the tablets whole. Don't chew, crush, or break the medications because it might not work as well  For each dose of the medication, you should be taking FOUR tablets at one time, TWICE a day   Finish your full five-day course of Molnupiravir even if you feel better before you're done. Stopping this medication too early can make it less effective to prevent severe illness related to [place].    Molnupiravir is prescribed for YOU ONLY. Don't share it with others, even if they have similar symptoms as you. This medication might not be right  for everyone.   Make sure to take steps to protect yourself and others while you're taking this medication in order to get well soon and to prevent others from getting sick with COVID-19.   **If you are of childbearing potential (any gender) - it is advised to not get pregnant while taking this medication and recommended that condoms are used for female partners the next 3 months after taking the medication out of extreme caution    COMMON SIDE EFFECTS: Diarrhea Nausea  Dizziness    If your COVID-19 symptoms get worse, get medical help right away. Call 911 if you experience symptoms such as worsening cough, trouble breathing, chest pain that doesn't go away, confusion, a hard time staying awake, and pale or blue-colored skin. This medication won't prevent all COVID-19 cases from getting worse.   Can take to lessen severity: Vit C 500mg  twice daily Quercertin 250-500mg  twice daily Zinc 75-100mg  daily Melatonin 3-6 mg at bedtime Vit D3 1000-2000 IU daily Aspirin 81 mg daily with food Optional: Famotidine 20mg  daily Also can add tylenol/ibuprofen as needed for fevers and body aches May add Mucinex or Mucinex DM as needed for cough/congestion  10 Things You Can Do to Manage Your COVID-19 Symptoms at Home If you have possible or confirmed COVID-19 Stay home except to get medical care. Monitor your symptoms carefully. If your symptoms get worse, call your healthcare provider immediately. Get rest and stay hydrated. If you have a medical appointment, call the healthcare provider ahead of time and tell them that you have or may have COVID-19. For medical emergencies, call 911 and notify the dispatch personnel that you have or may have COVID-19. Cover your cough and sneezes with a tissue or use the inside of your elbow. Wash your hands often with soap and water for at least 20 seconds or clean your hands with an alcohol-based hand sanitizer that contains at least 60% alcohol. As much as  possible, stay in a specific room and away from other people in your home. Also, you should use a separate bathroom, if available. If you need to be around other people in or outside of the home, wear a mask. Avoid sharing personal items with other people in your household, like dishes, towels, and bedding. Clean all surfaces that are touched often, like counters, tabletops, and doorknobs. Use household cleaning sprays or wipes according to the label instructions. June 07/25/2019 This information is not intended to replace advice given to you by your health care provider. Make sure you discuss any questions you have with your healthcare provider. Document Revised: 02/13/2020 Document Reviewed: 02/13/2020 Elsevier Patient Education  2022 [place]: What to Do if You Are Sick CDC has updated isolation and quarantine recommendations for the public, and is revising the CDC website to reflect these changes. These  recommendations do not apply to healthcare personnel and do not supersede state, local, tribal, or territorial laws, rules, andregulations. If you have a fever, cough or other symptoms, you might have COVID-19. Most people have mild illness and are able to recover at home. If you are sick: Keep track of your symptoms. If you have an emergency warning sign (including trouble breathing), call 911. Steps to help prevent the spread of COVID-19 if you are sick If you are sick with COVID-19 or think you might have COVID-19, follow the steps below to care for yourself and to help protect other peoplein your home and community. Stay home except to get medical care Stay home. Most people with COVID-19 have mild illness and can recover at home without medical care. Do not leave your home, except to get medical care. Do not visit public areas. Take care of yourself. Get rest and stay hydrated. Take over-the-counter medicines, such as acetaminophen, to help you feel  better. Stay in touch with your doctor. Call before you get medical care. Be sure to get care if you have trouble breathing, or have any other emergency warning signs, or if you think it is an emergency. Avoid public transportation, ride-sharing, or taxis. Separate yourself from other people As much as possible, stay in a specific room and away from other people and pets in your home. If possible, you should use a separate bathroom. If you need to be around other people or animals in oroutside of the home, wear a mask. Tell your close contactsthat they may have been exposed to COVID-19. An infected person can spread COVID-19 starting 48 hours (or 2 days) before the person has any symptoms or tests positive. By letting your close contacts know they may have been exposed to COVID-19, you are helping to protect everyone. Additional guidance is available for those living in close quarters and shared housing. See COVID-19 and Animals if you have questions about pets. If you are diagnosed with COVID-19, someone from the health department may call you. Answer the call to slow the spread. Monitor your symptoms Symptoms of COVID-19 include fever, cough, or other symptoms. Follow care instructions from your healthcare provider and local health department. Your local health authorities may give instructions on checking your symptoms and reporting information. When to seek emergency medical attention Look for emergency warning signs* for COVID-19. If someone is showing any of these signs, seek emergency medical care immediately: Trouble breathing Persistent pain or pressure in the chest New confusion Inability to wake or stay awake Pale, gray, or blue-colored skin, lips, or nail beds, depending on skin tone *This list is not all possible symptoms. Please call your medical provider forany other symptoms that are severe or concerning to you. Call 911 or call ahead to your local emergency facility: Notify the  operator that you are seeking care for someone who has or may haveCOVID-19. Call ahead before visiting your doctor Call ahead. Many medical visits for routine care are being postponed or done by phone or telemedicine. If you have a medical appointment that cannot be postponed, call your doctor's office, and tell them you have or may have COVID-19. This will help the office protect themselves and other patients. Get tested If you have symptoms of COVID-19, get tested. While waiting for test results, you stay away from others, including staying apart from those living in your household. Self-tests are one of several options for testing for the virus that causes COVID-19 and may be more convenient  than laboratory-based tests and point-of-care tests. Ask your healthcare provider or your local health department if you need help interpreting your test results. You can visit your state, tribal, local, and territorial health department's website to look for the latest local information on testing sites. If you are sick, wear a mask over your nose and mouth You should wear a mask over your nose and mouth if you must be around other people or animals, including pets (even at home). You don't need to wear the mask if you are alone. If you can't put on a mask (because of trouble breathing, for example), cover your coughs and sneezes in some other way. Try to stay at least 6 feet away from other people. This will help protect the people around you. Masks should not be placed on young children under age 88 years, anyone who has trouble breathing, or anyone who is not able to remove the mask without help. Note: During the COVID-19 pandemic, medical grade facemasks are reserved forhealthcare workers and some first responders. Cover your coughs and sneezes Cover your mouth and nose with a tissue when you cough or sneeze. Throw away used tissues in a lined trash can. Immediately wash your hands with soap and water for  at least 20 seconds. If soap and water are not available, clean your hands with an alcohol-based hand sanitizer that contains at least 60% alcohol. Clean your hands often Wash your hands often with soap and water for at least 20 seconds. This is especially important after blowing your nose, coughing, or sneezing; going to the bathroom; and before eating or preparing food. Use hand sanitizer if soap and water are not available. Use an alcohol-based hand sanitizer with at least 60% alcohol, covering all surfaces of your hands and rubbing them together until they feel dry. Soap and water are the best option, especially if hands are visibly dirty. Avoid touching your eyes, nose, and mouth with unwashed hands. Handwashing Tips Avoid sharing personal household items Do not share dishes, drinking glasses, cups, eating utensils, towels, or bedding with other people in your home. Wash these items thoroughly after using them with soap and water or put in the dishwasher. Clean all "high-touch" surfaces every day Clean and disinfect high-touch surfaces in your "sick room" and bathroom; wear disposable gloves. Let someone else clean and disinfect surfaces in common areas, but you should clean your bedroom and bathroom, if possible. If a caregiver or other person needs to clean and disinfect a sick person's bedroom or bathroom, they should do so on an as-needed basis. The caregiver/other person should wear a mask and disposable gloves prior to cleaning. They should wait as long as possible after the person who is sick has used the bathroom before coming in to clean and use the bathroom. High-touch surfaces include phones, remote controls, counters, tabletops, doorknobs, bathroom fixtures, toilets, keyboards, tablets, and bedside tables. Clean and disinfect areas that may have blood, stool, or body fluids on them. Use household cleaners and disinfectants. Clean the area or item with soap and water or another  detergent if it is dirty. Then, use a household disinfectant. Be sure to follow the instructions on the label to ensure safe and effective use of the product. Many products recommend keeping the surface wet for several minutes to ensure germs are killed. Many also recommend precautions such as wearing gloves and making sure you have good ventilation during use of the product. Use a product from [employer] List N: Disinfectants for  Coronavirus (HTDSK-87). Complete Disinfection Guidance When you can be around others after being sick with COVID-19 Deciding when you can be around others is different for different situations. Find out when you can safely end home isolation. For any additional questions about your care,contact your healthcare provider or state or local health department. 12/17/2019 Content source: [facility] for Immunization and Respiratory Diseases ([facility]), Division of Viral Diseases This information is not intended to replace advice given to you by your health care provider. Make sure you discuss any questions you have with your healthcare provider. Document Revised: 02/13/2020 Document Reviewed: 02/13/2020 Elsevier Patient Education  [place].

## 2020-07-08 NOTE — Telephone Encounter (Signed)
Decreased appetite set off BPA. Left message re staying hydrated. Telephone number given to call bsck if has questions

## 2020-07-08 NOTE — Progress Notes (Signed)
[name], Heather Cain are scheduled for a virtual visit with your provider today.    Just as we do with appointments in the office, we must obtain your consent to participate.  Your consent will be active for this visit and any virtual visit you may have with one of our providers in the next 365 days.    If you have a MyChart account, I can also send a copy of this consent to you electronically.  All virtual visits are billed to your insurance company just like a traditional visit in the office.  As this is a virtual visit, video technology does not allow for your provider to perform a traditional examination.  This may limit your provider's ability to fully assess your condition.  If your provider identifies any concerns that need to be evaluated in person or the need to arrange testing such as labs, EKG, etc, we will make arrangements to do so.    Although advances in technology are sophisticated, we cannot ensure that it will always work on either your end or our end.  If the connection with a video visit is poor, we may have to switch to a telephone visit.  With either a video or telephone visit, we are not always able to ensure that we have a secure connection.   I need to obtain your verbal consent now.   Are you willing to proceed with your visit today?   Heather Cain has provided verbal consent on 07/08/2020 for a virtual visit (video or telephone).   Mar Daring, PA-C 07/08/2020  2:15 PM  Virtual Visit Consent   Heather Cain, you are scheduled for a virtual visit with a [facility] provider today.     Just as with appointments in the office, your consent must be obtained to participate.  Your consent will be active for this visit and any virtual visit you may have with one of our providers in the next 365 days.     If you have a MyChart account, a copy of this consent can be sent to you electronically.  All virtual visits are billed to your insurance company just like a traditional  visit in the office.    As this is a virtual visit, video technology does not allow for your provider to perform a traditional examination.  This may limit your provider's ability to fully assess your condition.  If your provider identifies any concerns that need to be evaluated in person or the need to arrange testing (such as labs, EKG, etc.), we will make arrangements to do so.     Although advances in technology are sophisticated, we cannot ensure that it will always work on either your end or our end.  If the connection with a video visit is poor, the visit may have to be switched to a telephone visit.  With either a video or telephone visit, we are not always able to ensure that we have a secure connection.     I need to obtain your verbal consent now.   Are you willing to proceed with your visit today?    Heather Cain has provided verbal consent on 07/08/2020 for a virtual visit (video or telephone).   Mar Daring, PA-C   Date: 07/08/2020 2:15 PM   Virtual Visit via Video Note   I, Mar Daring, connected with  Heather Cain  ([id number][id number] on 07/08/20 at  2:00 PM EDT by a video-enabled telemedicine application  and verified that I am speaking with the correct person using two identifiers.  Location: Patient: Virtual Visit Location Patient: Home Provider: Virtual Visit Location Provider: Home Office   I discussed the limitations of evaluation and management by telemedicine and the availability of in person appointments. The patient expressed understanding and agreed to proceed.    History of Present Illness: Heather Cain is a 50 y.o. who identifies as a female who was assigned female at birth, and is being seen today for Covid 19, possible antiviral medications. Completed an e-visit on 07/07/20 and was prescribed albuterol and tessalon perles. PMH: T2DM, obese, smoker  HPI: URI  This is a new problem. The current episode started in the past 7 days  (07/06/20; tested positive for covid 19 on 07/07/20). The problem has been unchanged. There has been no fever. Associated symptoms include congestion, coughing (productive), headaches, nausea, rhinorrhea, sinus pain and a sore throat. Pertinent negatives include no abdominal pain, ear pain or vomiting. Associated symptoms comments: Chills, body aches, eyes burning, dizziness, post nasal drainage . She has tried acetaminophen, increased fluids and sleep for the symptoms.           Problems:  Patient Active Problem List   Diagnosis Date Noted   Sinusitis 05/18/2020   Class 3 severe obesity due to excess calories with serious comorbidity and body mass index (BMI) of 60.0 to 69.9 in adult (Heather Cain) 05/18/2020   Diabetes ([place]) 05/12/2020   Elevated triglycerides with high cholesterol 05/12/2020   Abnormality of breast on screening mammography 05/12/2020    Allergies:  Allergies  Allergen Reactions   Codeine Itching   Medications:  Current Outpatient Medications:    ipratropium (ATROVENT) 0.03 % nasal spray, Place 2 sprays into both nostrils every 12 (twelve) hours., Disp: 30 mL, Rfl: 12   molnupiravir EUA 200 mg CAPS, Take 4 capsules (800 mg total) by mouth 2 (two) times daily for 5 days., Disp: 40 capsule, Rfl: 0   albuterol (VENTOLIN HFA) 108 (90 Base) MCG/ACT inhaler, Inhale 2 puffs into the lungs every 6 (six) hours as needed for wheezing or shortness of breath., Disp: 8 g, Rfl: 0   benzonatate (TESSALON) 100 MG capsule, Take 1 capsule (100 mg total) by mouth 3 (three) times daily as needed for cough., Disp: 30 capsule, Rfl: 0   fluticasone (FLONASE) 50 MCG/ACT nasal spray, Place into both nostrils daily., Disp: , Rfl:    glimepiride (AMARYL) 2 MG tablet, Take 1 tablet (2 mg total) by mouth daily before breakfast., Disp: 30 tablet, Rfl: 1   loratadine (CLARITIN) 10 MG tablet, Take 10 mg by mouth daily., Disp: , Rfl:   Observations/Objective: Patient is well-developed, well-nourished, obese  female in no acute distress.  Resting comfortably at home.  Head is normocephalic, atraumatic.  No labored breathing. No adventitious lung sounds audibly heard during video call Speech is clear and coherent with logical content.  Patient is alert and oriented at baseline.   Assessment and Plan: 1. COVID-19 - ipratropium (ATROVENT) 0.03 % nasal spray; Place 2 sprays into both nostrils every 12 (twelve) hours.  Dispense: 30 mL; Refill: 12 - molnupiravir EUA 200 mg CAPS; Take 4 capsules (800 mg total) by mouth 2 (two) times daily for 5 days.  Dispense: 40 capsule; Refill: 0 - Continue OTC symptomatic management of choice - Will send OTC vitamins and supplement information through AVS - Molnupiravir prescribed - Patient enrolled in MyChart symptom monitoring - Push fluids - Rest as needed - Discussed  return precautions and when to seek in-person evaluation, sent via AVS as well  Follow Up Instructions: I discussed the assessment and treatment plan with the patient. The patient was provided an opportunity to ask questions and all were answered. The patient agreed with the plan and demonstrated an understanding of the instructions.  A copy of instructions were sent to the patient via MyChart.  The patient was advised to call back or seek an in-person evaluation if the symptoms worsen or if the condition fails to improve as anticipated.  Time:  I spent 15 minutes with the patient via telehealth technology discussing the above problems/concerns.    Mar Daring, PA-C

## 2020-07-20 ENCOUNTER — Other Ambulatory Visit: Payer: Self-pay | Admitting: Internal Medicine

## 2020-07-22 ENCOUNTER — Ambulatory Visit: Payer: 59 | Admitting: Family Medicine

## 2020-07-30 ENCOUNTER — Other Ambulatory Visit: Payer: Self-pay

## 2020-07-30 ENCOUNTER — Emergency Department
Admission: EM | Admit: 2020-07-30 | Discharge: 2020-07-31 | Disposition: A | Discharge status: Home / Self Care | Payer: 59 | Attending: Emergency Medicine | Admitting: Emergency Medicine

## 2020-07-30 ENCOUNTER — Encounter: Payer: Self-pay | Admitting: Family Medicine

## 2020-07-30 ENCOUNTER — Ambulatory Visit (INDEPENDENT_AMBULATORY_CARE_PROVIDER_SITE_OTHER): Payer: 59 | Admitting: Family Medicine

## 2020-07-30 VITALS — BP 124/69 | HR 90 | Ht 60.0 in | Wt 322.6 lb

## 2020-07-30 DIAGNOSIS — E1142 Type 2 diabetes mellitus with diabetic polyneuropathy: Secondary | ICD-10-CM | POA: Insufficient documentation

## 2020-07-30 DIAGNOSIS — R202 Paresthesia of skin: Secondary | ICD-10-CM | POA: Diagnosis not present

## 2020-07-30 DIAGNOSIS — R531 Weakness: Secondary | ICD-10-CM | POA: Diagnosis not present

## 2020-07-30 DIAGNOSIS — F17200 Nicotine dependence, unspecified, uncomplicated: Secondary | ICD-10-CM | POA: Diagnosis not present

## 2020-07-30 DIAGNOSIS — R0789 Other chest pain: Secondary | ICD-10-CM | POA: Insufficient documentation

## 2020-07-30 DIAGNOSIS — G6289 Other specified polyneuropathies: Secondary | ICD-10-CM | POA: Diagnosis not present

## 2020-07-30 DIAGNOSIS — R200 Anesthesia of skin: Secondary | ICD-10-CM | POA: Diagnosis not present

## 2020-07-30 DIAGNOSIS — Z7984 Long term (current) use of oral hypoglycemic drugs: Secondary | ICD-10-CM | POA: Diagnosis not present

## 2020-07-30 DIAGNOSIS — R0600 Dyspnea, unspecified: Secondary | ICD-10-CM

## 2020-07-30 DIAGNOSIS — R0609 Other forms of dyspnea: Secondary | ICD-10-CM | POA: Diagnosis not present

## 2020-07-30 DIAGNOSIS — E119 Type 2 diabetes mellitus without complications: Secondary | ICD-10-CM | POA: Diagnosis not present

## 2020-07-30 HISTORY — DX: Type 2 diabetes mellitus without complications: E11.9

## 2020-07-30 HISTORY — DX: Pure hypercholesterolemia, unspecified: E78.00

## 2020-07-30 LAB — CBC WITH DIFFERENTIAL/PLATELET
Abs Immature Granulocytes: 0.03 10*3/uL (ref 0.00–0.07)
Basophils Absolute: 0 10*3/uL (ref 0.0–0.1)
Basophils Relative: 0 %
Eosinophils Absolute: 0.2 10*3/uL (ref 0.0–0.5)
Eosinophils Relative: 2 %
HCT: 43.2 % (ref 36.0–46.0)
Hemoglobin: 14.3 g/dL (ref 12.0–15.0)
Immature Granulocytes: 0 %
Lymphocytes Relative: 34 %
Lymphs Abs: 3.2 10*3/uL (ref 0.7–4.0)
MCH: 31.5 pg (ref 26.0–34.0)
MCHC: 33.1 g/dL (ref 30.0–36.0)
MCV: 95.2 fL (ref 80.0–100.0)
Monocytes Absolute: 0.5 10*3/uL (ref 0.1–1.0)
Monocytes Relative: 6 %
Neutro Abs: 5.2 10*3/uL (ref 1.7–7.7)
Neutrophils Relative %: 58 %
Platelets: 232 10*3/uL (ref 150–400)
RBC: 4.54 MIL/uL (ref 3.87–5.11)
RDW: 14.3 % (ref 11.5–15.5)
WBC: 9.2 10*3/uL (ref 4.0–10.5)
nRBC: 0 % (ref 0.0–0.2)

## 2020-07-30 LAB — POCT GLYCOSYLATED HEMOGLOBIN (HGB A1C): Hemoglobin A1C: 6.3 % — AB (ref 4.0–5.6)

## 2020-07-30 LAB — BASIC METABOLIC PANEL
Anion gap: 7 (ref 5–15)
BUN: 17 mg/dL (ref 6–20)
CO2: 26 mmol/L (ref 22–32)
Calcium: 9.1 mg/dL (ref 8.9–10.3)
Chloride: 106 mmol/L (ref 98–111)
Creatinine, Ser: 0.85 mg/dL (ref 0.44–1.00)
GFR, Estimated: >60 mL/min (ref >60)
Glucose, Bld: 119 mg/dL — ABNORMAL HIGH (ref 70–99)
Potassium: 3.7 mmol/L (ref 3.5–5.1)
Sodium: 139 mmol/L (ref 135–145)

## 2020-07-30 LAB — CBG MONITORING, ED: Glucose-Capillary: 111 mg/dL — ABNORMAL HIGH (ref 70–99)

## 2020-07-30 LAB — BRAIN NATRIURETIC PEPTIDE: B Natriuretic Peptide: 42.5 pg/mL (ref 0.0–100.0)

## 2020-07-30 LAB — GLUCOSE, POCT (MANUAL RESULT ENTRY): POC Glucose: 126 mg/dl — AB (ref 70–99)

## 2020-07-30 NOTE — Assessment & Plan Note (Signed)
Patient with left arm and leg tingling that is worse with exertion, ECG is borderline. Denies active CP or pressure.  Plan- Patient wants to drive herself to the ER, says that she will go this evening. Advised her to go directly to ED.

## 2020-07-30 NOTE — Progress Notes (Signed)
Established Patient Office Visit  SUBJECTIVE:  Subjective  Patient ID: Heather Cain, female    DOB: 12-23-1970  Age: 50 y.o. MRN: [id number]  CC:  Chief Complaint  Patient presents with   Diabetes    HPI Heather Cain is a 50 y.o. female presenting today for     Past Medical History:  Diagnosis Date   Diabetes mellitus without complication ([place])    High cholesterol    Vaginal Pap smear, abnormal     Past Surgical History:  Procedure Laterality Date   CRYOTHERAPY     WRIST SURGERY      Family History  Problem Relation Age of Onset   Ovarian cancer Mother    Breast cancer Maternal Grandmother    Breast cancer Paternal Grandmother     Social History   Socioeconomic History   Marital status: Single    Spouse name: Not on file   Number of children: Not on file   Years of education: Not on file   Highest education level: Not on file  Occupational History   Not on file  Tobacco Use   Smoking status: Every Day   Smokeless tobacco: Never  Vaping Use   Vaping Use: Some days  Substance and Sexual Activity   Alcohol use: Yes   Drug use: Not Currently   Sexual activity: Not Currently    Birth control/protection: None  Other Topics Concern   Not on file  Social History Narrative   Not on file   Social Determinants of Health   Financial Resource Strain: Not on file  Food Insecurity: Not on file  Transportation Needs: Not on file  Physical Activity: Not on file  Stress: Not on file  Social Connections: Not on file  Intimate Partner Violence: Not on file     Current Outpatient Medications:    albuterol (VENTOLIN HFA) 108 (90 Base) MCG/ACT inhaler, Inhale 2 puffs into the lungs every 6 (six) hours as needed for wheezing or shortness of breath., Disp: 8 g, Rfl: 0   benzonatate (TESSALON) 100 MG capsule, Take 1 capsule (100 mg total) by mouth 3 (three) times daily as needed for cough., Disp: 30 capsule, Rfl: 0   fluticasone (FLONASE) 50 MCG/ACT nasal  spray, Place into both nostrils daily., Disp: , Rfl:    glimepiride (AMARYL) 2 MG tablet, TAKE 1 TABLET BY MOUTH ONCE DAILY BEFORE BREAKFAST, Disp: 30 tablet, Rfl: 0   ipratropium (ATROVENT) 0.03 % nasal spray, Place 2 sprays into both nostrils every 12 (twelve) hours., Disp: 30 mL, Rfl: 12   loratadine (CLARITIN) 10 MG tablet, Take 10 mg by mouth daily., Disp: , Rfl:    Allergies  Allergen Reactions   Codeine Itching    ROS Review of Systems  Constitutional: Negative.   HENT: Negative.    Respiratory: Negative.    Cardiovascular:  Positive for leg swelling. Negative for chest pain and palpitations.  Genitourinary: Negative.   Neurological:  Positive for numbness.  Hematological: Negative.   Psychiatric/Behavioral: Negative.      OBJECTIVE:    Physical Exam HENT:     Head: Normocephalic.  Cardiovascular:     Rate and Rhythm: Normal rate and regular rhythm.  Pulmonary:     Effort: Pulmonary effort is normal.  Musculoskeletal:        General: Normal range of motion.    BP 124/69   Pulse 90   Ht 5' (1.524 m)   Wt (!) 322 lb 9.6 oz (146.3 kg)  BMI 63.00 kg/m  Wt Readings from Last 3 Encounters:  07/30/20 (!) 322 lb 9.6 oz (146.3 kg)  06/03/20 (!) 325 lb 3.2 oz (147.5 kg)  05/18/20 (!) 324 lb 14.4 oz (147.4 kg)    Health Maintenance Due  Topic Date Due   PNEUMOCOCCAL POLYSACCHARIDE VACCINE AGE 60-64 HIGH RISK  Never done   Pneumococcal Vaccine 71-25 Years old (1 - PCV) Never done   FOOT EXAM  Never done   OPHTHALMOLOGY EXAM  Never done   URINE MICROALBUMIN  Never done   HIV Screening  Never done   Hepatitis C Screening  Never done   COLONOSCOPY (Pts 45-80yr Insurance coverage will need to be confirmed)  Never done   TETANUS/TDAP  04/22/2016   COVID-19 Vaccine (3 - Booster for Moderna series) 09/15/2019   Zoster Vaccines- Shingrix (1 of 2) Never done   INFLUENZA VACCINE  08/09/2020    There are no preventive care reminders to display for this patient.  CBC  Latest Ref Rng & Units 07/30/2020 05/05/2020 05/12/2015  WBC 4.0 - 10.5 K/uL 9.2 7.8 8.3  Hemoglobin 12.0 - 15.0 g/dL 14.3 14.6 12.8  Hematocrit 36.0 - 46.0 % 43.2 42.4 36.9  Platelets 150 - 400 K/uL 232 239 205   CMP Latest Ref Rng & Units 07/30/2020 05/05/2020 05/12/2015  Glucose 70 - 99 mg/dL 119(H) 138(H) 104(H)  BUN 6 - 20 mg/dL 17 11 11   Creatinine 0.44 - 1.00 mg/dL 0.85 0.95 0.86  Sodium 135 - 145 mmol/L 139 141 140  Potassium 3.5 - 5.1 mmol/L 3.7 4.3 3.5  Chloride 98 - 111 mmol/L 106 102 107  CO2 22 - 32 mmol/L 26 21 26   Calcium 8.9 - 10.3 mg/dL 9.1 8.8 8.5(L)  Total Protein 6.0 - 8.5 g/dL - 6.7 -  Total Bilirubin 0.0 - 1.2 mg/dL - 0.2 -  Alkaline Phos 44 - 121 IU/L - 109 -  AST 0 - 40 IU/L - 10 -  ALT 0 - 32 IU/L - 14 -    Lab Results  Component Value Date   TSH 1.850 05/05/2020   Lab Results  Component Value Date   ALBUMIN 3.9 05/05/2020   ANIONGAP 7 07/30/2020   EGFR 73 05/05/2020   Lab Results  Component Value Date   CHOL 183 05/05/2020   HDL 30 (L) 05/05/2020   LDLCALC 90 05/05/2020   CHOLHDL 6.1 (H) 05/05/2020   Lab Results  Component Value Date   TRIG 378 (H) 05/05/2020   Lab Results  Component Value Date   HGBA1C 6.3 (A) 07/30/2020   HGBA1C 7.0 (H) 05/05/2020      ASSESSMENT & PLAN:   Problem List Items Addressed This Visit       Endocrine   Diabetes ([place] - Primary   Relevant Orders   POCT Glucose (CBG) (Completed)   POCT HgB A1C (Completed)     Other   Atypical chest pain    Patient with left arm and leg tingling that is worse with exertion, ECG is borderline. Denies active CP or pressure.  Plan- Patient wants to drive herself to the ER, says that she will go this evening. Advised her to go directly to ED.        Other Visit Diagnoses     Numbness and tingling       Relevant Orders   EKG 12-Lead (Completed)       No orders of the defined types were placed in this encounter.  Follow-up: No follow-ups on file.     Beckie Salts, [place] [address] [place], [place] [address]

## 2020-07-30 NOTE — ED Triage Notes (Addendum)
Pt states she was at her PCP today for a follow up for new onset of diabetes. Pt states for the past month she has had numbness in her left arm and leg, pt states the numbness was worse last night. Pts PCP told her to come to the ED. Pt also states that she has sob with exertion. Pt states that she began taking lasix 3 months ago for swelling in her left ankle.

## 2020-07-31 ENCOUNTER — Emergency Department: Payer: 59

## 2020-07-31 LAB — TROPONIN I (HIGH SENSITIVITY): Troponin I (High Sensitivity): <2 ng/L (ref <18)

## 2020-07-31 NOTE — ED Provider Notes (Signed)
[facility] Emergency Department Provider Note   ____________________________________________   Event Date/Time   First MD Initiated Contact with Patient 07/31/20 [phone]     (approximate)  I have reviewed the triage vital signs and the nursing notes.   HISTORY  Chief Complaint Numbness    HPI Heather Cain is a 50 y.o. female with past medical history of hyperlipidemia and diabetes who presents to the ED complaining of numbness and shortness of breath.  Patient reports that she has been dealing with intermittent numbness, tingling, and weakness and her left side for about the past month.  She states that we will affect either her left arm or left leg at separate times, does not involve both her arm and leg simultaneously.  It can come on at any time, either with exertion or at rest.  She states that sometimes will feel like a numbness or tingling and sometimes will feel like a weakness.  She denies any vision changes, speech changes, or facial droop.  This is been associated with dyspnea on exertion, but she denies any fevers, cough, or chest pain.  She was seen by her PCP earlier today and referred to the ED for further evaluation.  She was recently diagnosed with diabetes and started on medication for this as well as for her cholesterol.        Past Medical History:  Diagnosis Date   Diabetes mellitus without complication (HCC)    High cholesterol    Vaginal Pap smear, abnormal     Patient Active Problem List   Diagnosis Date Noted   Atypical chest pain 07/30/2020   Sinusitis 05/18/2020   Class 3 severe obesity due to excess calories with serious comorbidity and body mass index (BMI) of 60.0 to 69.9 in adult ([place]) 05/18/2020   Diabetes ([place]) 05/12/2020   Elevated triglycerides with high cholesterol 05/12/2020   Abnormality of breast on screening mammography 05/12/2020    Past Surgical History:  Procedure Laterality Date   CRYOTHERAPY     WRIST  SURGERY      Prior to Admission medications   Medication Sig Start Date End Date Taking? Authorizing Provider  albuterol (VENTOLIN HFA) 108 (90 Base) MCG/ACT inhaler Inhale 2 puffs into the lungs every 6 (six) hours as needed for wheezing or shortness of breath. 07/07/20   Brunetta Jeans, PA-C  benzonatate (TESSALON) 100 MG capsule Take 1 capsule (100 mg total) by mouth 3 (three) times daily as needed for cough. 07/07/20   Brunetta Jeans, PA-C  fluticasone (FLONASE) 50 MCG/ACT nasal spray Place into both nostrils daily.    [provider]  glimepiride (AMARYL) 2 MG tablet TAKE 1 TABLET BY MOUTH ONCE DAILY BEFORE BREAKFAST 07/20/20   Cletis Athens, MD  ipratropium (ATROVENT) 0.03 % nasal spray Place 2 sprays into both nostrils every 12 (twelve) hours. 07/08/20   Mar Daring, PA-C  loratadine (CLARITIN) 10 MG tablet Take 10 mg by mouth daily.    [provider]    Allergies Codeine  Family History  Problem Relation Age of Onset   Ovarian cancer Mother    Breast cancer Maternal Grandmother    Breast cancer Paternal Grandmother     Social History Social History   Tobacco Use   Smoking status: Every Day   Smokeless tobacco: Never  Vaping Use   Vaping Use: Some days  Substance Use Topics   Alcohol use: Yes   Drug use: Not Currently    Review  of Systems  Constitutional: No fever/chills Eyes: No visual changes. ENT: No sore throat. Cardiovascular: Denies chest pain. Respiratory: Positive for shortness of breath. Gastrointestinal: No abdominal pain.  No nausea, no vomiting.  No diarrhea.  No constipation. Genitourinary: Negative for dysuria. Musculoskeletal: Negative for back pain. Skin: Negative for rash. Neurological: Negative for headaches, f positive for left-sided numbness and weakness.  ____________________________________________   PHYSICAL EXAM:  VITAL SIGNS: ED Triage Vitals  Enc Vitals Group     BP 07/30/20 2012 122/89     Pulse  Rate 07/30/20 2012 94     Resp 07/30/20 2012 20     Temp 07/30/20 2012 98.7 F (37.1 C)     Temp Source 07/30/20 2012 Oral     SpO2 07/30/20 2012 97 %     Weight 07/30/20 2009 (!) 322 lb 9.6 oz (146.3 kg)     Height 07/30/20 2009 5' (1.524 m)     Head Circumference --      Peak Flow --      Pain Score 07/30/20 2009 0     Pain Loc --      Pain Edu? --      Excl. in [place]? --     Constitutional: Alert and oriented. Eyes: Conjunctivae are normal. Head: Atraumatic. Nose: No congestion/rhinnorhea. Mouth/Throat: Mucous membranes are moist. Neck: Normal ROM Cardiovascular: Normal rate, regular rhythm. Grossly normal heart sounds.  2+ radial pulses bilaterally. Respiratory: Normal respiratory effort.  No retractions. Lungs CTAB. Gastrointestinal: Soft and nontender. No distention. Genitourinary: deferred Musculoskeletal: No lower extremity tenderness nor edema. Neurologic:  Normal speech and language. No gross focal neurologic deficits are appreciated. Skin:  Skin is warm, dry and intact. No rash noted. Psychiatric: Mood and affect are normal. Speech and behavior are normal.  ____________________________________________   LABS (all labs ordered are listed, but only abnormal results are displayed)  Labs Reviewed  BASIC METABOLIC PANEL - Abnormal; Notable for the following components:      Result Value   Glucose, Bld 119 (*)    All other components within normal limits  CBG MONITORING, ED - Abnormal; Notable for the following components:   Glucose-Capillary 111 (*)    All other components within normal limits  CBC WITH DIFFERENTIAL/PLATELET  BRAIN NATRIURETIC PEPTIDE  URINALYSIS, COMPLETE (UACMP) WITH MICROSCOPIC  TROPONIN I (HIGH SENSITIVITY)   ____________________________________________  EKG  ED ECG REPORT I, Blake Divine, the attending physician, personally viewed and interpreted this ECG.   Date: 07/31/2020  EKG Time: 20:13  Rate: 101  Rhythm: normal sinus  rhythm  Axis: LAD  Intervals:none  ST&T Change: None   PROCEDURES  Procedure(s) performed (including Critical Care):  Procedures   ____________________________________________   INITIAL IMPRESSION / ASSESSMENT AND PLAN / ED COURSE      50 year old female with past medical history of hyperlipidemia and diabetes who presents to the ED complaining of intermittent numbness and weakness affecting her left arm and left leg separately along with some dyspnea on exertion.  She is not in any respiratory distress and is maintaining O2 sats on room air, states she was able to walk from the parking lot to triage with minimal difficulty.  EKG shows no evidence of arrhythmia or ischemia, chest x-ray reviewed by me and shows no infiltrate, edema, or effusion.  Patient with no focal neurologic deficits on exam and symptoms do not seem consistent with stroke given variable distribution.  Labs are unremarkable, CT head is negative for acute process.  Troponin is negative  and I doubt cardiac etiology for her symptoms.  Patient is appropriate for discharge home with neurology follow-up, suspect her symptoms are due to a peripheral neuropathy.  She was counseled to return to the ED for new worsening symptoms, patient agrees with plan.      ____________________________________________   FINAL CLINICAL IMPRESSION(S) / ED DIAGNOSES  Final diagnoses:  Paresthesias  Other polyneuropathy  Dyspnea on exertion     ED Discharge Orders     None        Note:  This document was prepared using Dragon voice recognition software and may include unintentional dictation errors.    Blake Divine, MD 07/31/20 [phone]

## 2020-07-31 NOTE — ED Notes (Signed)
Dr. Charna Archer discharging pt in triage 2 at this time.

## 2020-08-05 ENCOUNTER — Other Ambulatory Visit: Payer: Self-pay | Admitting: Physician Assistant

## 2020-08-05 DIAGNOSIS — R42 Dizziness and giddiness: Secondary | ICD-10-CM

## 2020-08-05 DIAGNOSIS — R200 Anesthesia of skin: Secondary | ICD-10-CM

## 2020-08-05 DIAGNOSIS — M545 Low back pain, unspecified: Secondary | ICD-10-CM

## 2020-08-05 DIAGNOSIS — R202 Paresthesia of skin: Secondary | ICD-10-CM

## 2020-08-15 ENCOUNTER — Ambulatory Visit
Admission: RE | Admit: 2020-08-15 | Discharge: 2020-08-15 | Disposition: A | Discharge status: Home / Self Care | Payer: 59 | Source: Ambulatory Visit | Attending: Physician Assistant | Admitting: Physician Assistant

## 2020-08-15 ENCOUNTER — Other Ambulatory Visit: Payer: Self-pay

## 2020-08-15 DIAGNOSIS — R42 Dizziness and giddiness: Secondary | ICD-10-CM | POA: Insufficient documentation

## 2020-08-15 DIAGNOSIS — M545 Low back pain, unspecified: Secondary | ICD-10-CM | POA: Diagnosis present

## 2020-08-15 DIAGNOSIS — R202 Paresthesia of skin: Secondary | ICD-10-CM | POA: Insufficient documentation

## 2020-08-15 DIAGNOSIS — R200 Anesthesia of skin: Secondary | ICD-10-CM | POA: Insufficient documentation

## 2020-08-15 MED ORDER — GADOBUTROL 1 MMOL/ML IV SOLN
10.0000 mL | Freq: Once | INTRAVENOUS | Status: AC | PRN
  Administered 2020-08-15: 10 mL via INTRAVENOUS

## 2020-08-18 ENCOUNTER — Other Ambulatory Visit: Payer: Self-pay | Admitting: Internal Medicine

## 2020-08-20 ENCOUNTER — Other Ambulatory Visit: Payer: Self-pay

## 2020-08-20 MED ORDER — ACCU-CHEK SOFTCLIX LANCETS MISC: 3 refills | Status: DC

## 2020-08-20 MED ORDER — ACCU-CHEK GUIDE VI STRP
ORAL_STRIP | 3 refills | Status: DC
Start: 2020-08-20 — End: 2021-07-05

## 2020-09-20 ENCOUNTER — Other Ambulatory Visit: Payer: Self-pay | Admitting: Internal Medicine

## 2020-10-10 ENCOUNTER — Other Ambulatory Visit: Payer: Self-pay | Admitting: Internal Medicine

## 2020-10-15 ENCOUNTER — Other Ambulatory Visit: Payer: Self-pay | Admitting: Internal Medicine

## 2020-10-25 DIAGNOSIS — G629 Polyneuropathy, unspecified: Secondary | ICD-10-CM | POA: Insufficient documentation

## 2020-10-25 DIAGNOSIS — R200 Anesthesia of skin: Secondary | ICD-10-CM | POA: Insufficient documentation

## 2020-10-25 DIAGNOSIS — M65321 Trigger finger, right index finger: Secondary | ICD-10-CM | POA: Insufficient documentation

## 2020-11-20 ENCOUNTER — Other Ambulatory Visit: Payer: Self-pay | Admitting: Internal Medicine

## 2020-12-21 ENCOUNTER — Other Ambulatory Visit: Payer: Self-pay | Admitting: Internal Medicine

## 2021-01-19 ENCOUNTER — Other Ambulatory Visit: Payer: Self-pay | Admitting: Internal Medicine

## 2021-01-31 ENCOUNTER — Other Ambulatory Visit: Payer: Self-pay | Admitting: Student

## 2021-01-31 DIAGNOSIS — R921 Mammographic calcification found on diagnostic imaging of breast: Secondary | ICD-10-CM

## 2021-02-02 ENCOUNTER — Other Ambulatory Visit: Payer: Self-pay | Admitting: Student

## 2021-02-17 ENCOUNTER — Ambulatory Visit
Admission: RE | Admit: 2021-02-17 | Discharge: 2021-02-17 | Disposition: A | Discharge status: Home / Self Care | Payer: 59 | Source: Ambulatory Visit | Attending: Student | Admitting: Student

## 2021-02-17 ENCOUNTER — Other Ambulatory Visit: Payer: Self-pay

## 2021-02-17 DIAGNOSIS — R922 Inconclusive mammogram: Secondary | ICD-10-CM | POA: Diagnosis not present

## 2021-02-17 DIAGNOSIS — R921 Mammographic calcification found on diagnostic imaging of breast: Secondary | ICD-10-CM | POA: Diagnosis not present

## 2021-02-18 ENCOUNTER — Other Ambulatory Visit: Payer: Self-pay | Admitting: Student

## 2021-02-18 DIAGNOSIS — R928 Other abnormal and inconclusive findings on diagnostic imaging of breast: Secondary | ICD-10-CM

## 2021-02-18 DIAGNOSIS — R921 Mammographic calcification found on diagnostic imaging of breast: Secondary | ICD-10-CM

## 2021-02-28 ENCOUNTER — Other Ambulatory Visit: Payer: Self-pay | Admitting: Internal Medicine

## 2021-03-05 ENCOUNTER — Encounter: Payer: Self-pay | Admitting: Radiology

## 2021-03-07 DIAGNOSIS — M65321 Trigger finger, right index finger: Secondary | ICD-10-CM | POA: Diagnosis not present

## 2021-03-07 DIAGNOSIS — G5602 Carpal tunnel syndrome, left upper limb: Secondary | ICD-10-CM | POA: Diagnosis not present

## 2021-03-08 ENCOUNTER — Ambulatory Visit
Admission: RE | Admit: 2021-03-08 | Discharge: 2021-03-08 | Disposition: A | Discharge status: Home / Self Care | Payer: 59 | Source: Ambulatory Visit | Attending: Student | Admitting: Student

## 2021-03-08 ENCOUNTER — Other Ambulatory Visit: Payer: Self-pay

## 2021-03-08 DIAGNOSIS — R921 Mammographic calcification found on diagnostic imaging of breast: Secondary | ICD-10-CM

## 2021-03-08 DIAGNOSIS — D0511 Intraductal carcinoma in situ of right breast: Secondary | ICD-10-CM | POA: Diagnosis not present

## 2021-03-08 DIAGNOSIS — R928 Other abnormal and inconclusive findings on diagnostic imaging of breast: Secondary | ICD-10-CM | POA: Insufficient documentation

## 2021-03-08 HISTORY — PX: BREAST BIOPSY: SHX20

## 2021-03-09 LAB — SURGICAL PATHOLOGY

## 2021-03-10 ENCOUNTER — Other Ambulatory Visit: Payer: Self-pay | Admitting: Student

## 2021-03-10 DIAGNOSIS — R921 Mammographic calcification found on diagnostic imaging of breast: Secondary | ICD-10-CM

## 2021-03-10 DIAGNOSIS — R928 Other abnormal and inconclusive findings on diagnostic imaging of breast: Secondary | ICD-10-CM

## 2021-03-10 DIAGNOSIS — D0511 Intraductal carcinoma in situ of right breast: Secondary | ICD-10-CM

## 2021-03-10 NOTE — Progress Notes (Signed)
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 Radiologist recommended additional biopsy right breast calcifications, and MRI.  Worked with Barnie Del at [facility] to get these scheduled, and she will contact patient..  Med/Onc and Surgical consults scheduled 03/21/21. ?

## 2021-03-11 ENCOUNTER — Encounter: Payer: Self-pay | Admitting: Student

## 2021-03-14 ENCOUNTER — Telehealth: Payer: Self-pay | Admitting: *Deleted

## 2021-03-14 ENCOUNTER — Telehealth: Payer: Self-pay

## 2021-03-14 ENCOUNTER — Ambulatory Visit: Payer: Self-pay | Admitting: Surgery

## 2021-03-14 NOTE — Telephone Encounter (Signed)
Lmovm for the pt to call the office back  ?

## 2021-03-14 NOTE — Telephone Encounter (Signed)
Received a voicemail message from [facility] from today 3:47pm stating she was initially seen by [place] at [place] office. States she had a mammogram ordered and they found something and she had to go back. States they did biopsy and she has cancer. States they sent orders that need to be signed right away. States she has called many times and can't reach anyone. States she is getting the runaround and would appreciate a call.  ?Per chart review was seen at [place] office . Will forward to them and [facility]. ?Staci Acosta ?

## 2021-03-15 ENCOUNTER — Other Ambulatory Visit: Payer: Self-pay | Admitting: Student

## 2021-03-15 ENCOUNTER — Other Ambulatory Visit: Payer: Self-pay | Admitting: Internal Medicine

## 2021-03-15 DIAGNOSIS — D0511 Intraductal carcinoma in situ of right breast: Secondary | ICD-10-CM

## 2021-03-15 DIAGNOSIS — R928 Other abnormal and inconclusive findings on diagnostic imaging of breast: Secondary | ICD-10-CM

## 2021-03-15 DIAGNOSIS — Z1231 Encounter for screening mammogram for malignant neoplasm of breast: Secondary | ICD-10-CM

## 2021-03-15 NOTE — Progress Notes (Signed)
Due to appointment timing concerns, patient requests changing her Surgical consult to Dr. Christian Mate, 03/31/21 at 2:00. ?

## 2021-03-16 ENCOUNTER — Encounter: Payer: Self-pay | Admitting: *Deleted

## 2021-03-17 ENCOUNTER — Telehealth: Payer: Self-pay | Admitting: Lactation Services

## 2021-03-17 ENCOUNTER — Telehealth: Payer: Self-pay

## 2021-03-17 NOTE — Telephone Encounter (Signed)
I spoke with patient in regards to her new patient appointment for 3/13 '@2pm'$ . Patient stated she knew where was because she coming because she came her on accident one time before. Patient didn't have any questions or concerns at the moment.  ?

## 2021-03-17 NOTE — Telephone Encounter (Signed)
Called to speak with patient in regards to [facility] orders. She did not answer. LM for patient to call the office at [phone] or check her My Chart message. ?

## 2021-03-21 ENCOUNTER — Other Ambulatory Visit: Payer: Self-pay

## 2021-03-21 ENCOUNTER — Ambulatory Visit: Payer: 59 | Admitting: Surgery

## 2021-03-21 ENCOUNTER — Inpatient Hospital Stay: Payer: 59 | Attending: Internal Medicine | Admitting: Internal Medicine

## 2021-03-21 ENCOUNTER — Encounter: Payer: Self-pay | Admitting: Internal Medicine

## 2021-03-21 ENCOUNTER — Inpatient Hospital Stay: Payer: 59

## 2021-03-21 DIAGNOSIS — Z7984 Long term (current) use of oral hypoglycemic drugs: Secondary | ICD-10-CM | POA: Diagnosis not present

## 2021-03-21 DIAGNOSIS — E119 Type 2 diabetes mellitus without complications: Secondary | ICD-10-CM | POA: Diagnosis not present

## 2021-03-21 DIAGNOSIS — Z8616 Personal history of COVID-19: Secondary | ICD-10-CM | POA: Insufficient documentation

## 2021-03-21 DIAGNOSIS — Z79899 Other long term (current) drug therapy: Secondary | ICD-10-CM | POA: Diagnosis not present

## 2021-03-21 DIAGNOSIS — D0511 Intraductal carcinoma in situ of right breast: Secondary | ICD-10-CM | POA: Diagnosis not present

## 2021-03-21 NOTE — Assessment & Plan Note (Addendum)
#   Right breast-lower outer- DCIS high-grade; with comedonecrosis; [id number]. ? ?# Currently awaiting biopsy right upper quadrant-x2 on 3/16; awaiting breast MRI on 3/20.  Evaluation with Dr. Christian Mate on 3/23.   ?  ?# I had a long discussion with patient regarding pathology/natural history of DCIS.  Also had a long discussion regarding the treatment options which include lumpectomy.  Also discussed regarding radiation therapy post lumpectomy.  However if patient cannot have lumpectomy [awaiting above biopsies; also evaluation with Dr. Verita Lamb would be an option.  Patient would not receive radiation postmastectomy. ?  ? # Also discussed option of possible antihormone therapy-like anastrozole/Aromasin to cut down the risk of development development of ipsilateral/contralateral invasive/noninvasive breast cancer.  However ER status still pending.   ?  ?# Genetics: Discussed with patient and family regarding potential etiologies of breast cancer-sporadic versus genetics.  I suspect patient's malignancy is sporadic. Discussed regarding evaluation with genetic counseling.    Patient agreement. ? ?# DM [hemoglobin A1c] around 6 as per patient.  Monitor closely ? ?# PN upper lower extremities/chronic swelling in the legs on Lasix [? DM]- post covid-stable. ? ?# Thank you, Ms.Koositra CNM for allowing me to participate in the care of your pleasant patient. Please do not hesitate to contact me with questions or concerns in the interim. ? ?DISPOSITION: ?#  No labs today.  ?# referral to Com[facility]- re: DCIS ?# follow up in 5 weeks-; MD; no labs-Dr.B ?

## 2021-03-21 NOTE — Progress Notes (Signed)
[place] OFFICE PROGRESS NOTE  Patient Care Team: Cletis Athens, MD as PCP - General (Internal Medicine) Theodore Demark, RN as Oncology Nurse Navigator Ronny Bacon, MD as Consulting Physician (General Surgery) Cammie Sickle, MD as Consulting Physician (Oncology)   Cancer Staging  Ductal carcinoma in situ (DCIS) of right breast Staging form: Breast, AJCC 8th Edition - Clinical: cTis (DCIS) - Signed by Cammie Sickle, MD on 03/15/2021    Oncology History Overview Note  MAY 2022- [screening]- RECOMMENDATION:  # FEB 2023-    2. Stable probably benign calcifications in the upper central and upper outer right breast.   RECOMMENDATION: Stereotactic core needle biopsy of the right breast x 1.DIAGNOSIS:  A. BREAST CALCIFICATIONS, RIGHT LOWER OUTER; STEREOTACTIC BIOPSY:  - DUCTAL CARCINOMA IN SITU (DCIS), HIGH-GRADE, COMEDO-TYPE AND WITH  ASSOCIATED CALCIFICATIONS.  - DCIS IS PRESENT IN 5 OF 6 BLOCKS.    Ductal carcinoma in situ (DCIS) of right breast  03/15/2021 Initial Diagnosis   Ductal carcinoma in situ (DCIS) of right breast   03/15/2021 Cancer Staging   Staging form: Breast, AJCC 8th Edition - Clinical: cTis (DCIS) - Signed by Cammie Sickle, MD on 03/15/2021      INTERVAL HISTORY: Obese.  Alone.  Ambulating independently.  [name] 51 y.o.  female pleasant female patient above history with no prior history of breast cancer/or malignancies has been referred to [place] for further evaluation recommendations for new diagnosis of DCIS.   Patient had a routine screening mammogram which was abnormal -right breast lower in may 2022-this was followed by mammogram in 6 months which continues to show calcifications.  Which led to diagnostic mammogram ultrasound.  Subsequent biopsy/pathology as above   Patient notes a discoloration of the right breast post biopsy.  Otherwise denies any unusual new lumps or bumps.  Review of Systems   Constitutional:  Negative for chills, diaphoresis, fever, malaise/fatigue and weight loss.  HENT:  Negative for nosebleeds and sore throat.   Eyes:  Negative for double vision.  Respiratory:  Negative for cough, hemoptysis, sputum production, shortness of breath and wheezing.   Cardiovascular:  Negative for chest pain, palpitations, orthopnea and leg swelling.  Gastrointestinal:  Negative for abdominal pain, blood in stool, constipation, diarrhea, heartburn, melena, nausea and vomiting.  Genitourinary:  Negative for dysuria, frequency and urgency.  Musculoskeletal:  Negative for back pain and joint pain.  Skin: Negative.  Negative for itching and rash.  Neurological:  Negative for dizziness, tingling, focal weakness, weakness and headaches.  Endo/Heme/Allergies:  Does not bruise/bleed easily.  Psychiatric/Behavioral:  Negative for depression. The patient is not nervous/anxious and does not have insomnia.      PAST MEDICAL HISTORY :  Past Medical History:  Diagnosis Date   DCIS (ductal carcinoma in situ) of breast    Diabetes mellitus without complication (HCC)    High cholesterol    Vaginal Pap smear, abnormal     PAST SURGICAL HISTORY :   Past Surgical History:  Procedure Laterality Date   BREAST BIOPSY Right 03/08/2021   right breast bx coil clip -path pending   CRYOTHERAPY     WRIST SURGERY      FAMILY HISTORY :   Family History  Problem Relation Age of Onset   Breast cancer Maternal Grandmother     SOCIAL HISTORY:   Social History   Tobacco Use   Smoking status: Every Day    Packs/day: 1.00    Types: Cigarettes   Smokeless  tobacco: Never  Vaping Use   Vaping Use: Some days  Substance Use Topics   Alcohol use: Yes   Drug use: Not Currently    ALLERGIES:  is allergic to codeine.  MEDICATIONS:  Current Outpatient Medications  Medication Sig Dispense Refill   Accu-Chek Softclix Lancets lancets USE 1  TO CHECK GLUCOSE ONCE DAILY 200 each 3   albuterol  (VENTOLIN HFA) 108 (90 Base) MCG/ACT inhaler Inhale 2 puffs into the lungs every 6 (six) hours as needed for wheezing or shortness of breath. 8 g 0   cyanocobalamin 1000 MCG tablet Take by mouth.     fluticasone (FLONASE) 50 MCG/ACT nasal spray Place into both nostrils daily.     furosemide (LASIX) 20 MG tablet Take 20 mg by mouth daily.     gabapentin (NEURONTIN) 100 MG capsule TAKE 1 CAPSULE BY MOUTH TWICE DAILY FOR 7 DAYS THEN INCREASE TO 2 CAPS TWICE DAILY AND CONTINUE     glimepiride (AMARYL) 2 MG tablet TAKE 1 TABLET BY MOUTH ONCE DAILY BEFORE BREAKFAST 30 tablet 0   glucose blood (ACCU-CHEK GUIDE) test strip USE 1 STRIP TO CHECK GLUCOSE ONCE DAILY 200 each 3   loratadine (CLARITIN) 10 MG tablet Take 10 mg by mouth daily.     rosuvastatin (CRESTOR) 20 MG tablet Take 1 tablet by mouth once daily 30 tablet 0   benzonatate (TESSALON) 100 MG capsule Take 1 capsule (100 mg total) by mouth 3 (three) times daily as needed for cough. 30 capsule 0   ipratropium (ATROVENT) 0.03 % nasal spray Place 2 sprays into both nostrils every 12 (twelve) hours. 30 mL 12   No current facility-administered medications for this visit.    PHYSICAL EXAMINATION: ECOG PERFORMANCE STATUS: 0 - Asymptomatic  BP 110/70    Pulse 97    Temp (!) 96.3 F (35.7 C)    Resp 18    Ht '5\' 3"'$  (1.6 m)    Wt (!) 332 lb 3.2 oz (150.7 kg)    SpO2 95%    BMI 58.85 kg/m   Filed Weights   03/21/21 1355  Weight: (!) 332 lb 3.2 oz (150.7 kg)    Physical Exam Vitals and nursing note reviewed.  HENT:     Head: Normocephalic and atraumatic.     Mouth/Throat:     Pharynx: Oropharynx is clear.  Eyes:     Extraocular Movements: Extraocular movements intact.     Pupils: Pupils are equal, round, and reactive to light.  Cardiovascular:     Rate and Rhythm: Normal rate and regular rhythm.  Pulmonary:     Comments: Decreased breath sounds bilaterally.  Abdominal:     Palpations: Abdomen is soft.  Musculoskeletal:        General:  Normal range of motion.     Cervical back: Normal range of motion.  Skin:    General: Skin is warm.  Neurological:     General: No focal deficit present.     Mental Status: She is alert and oriented to person, place, and time.  Psychiatric:        Behavior: Behavior normal.        Judgment: Judgment normal.       LABORATORY DATA:  I have reviewed the data as listed    Component Value Date/Time   NA 139 07/30/2020 2014   NA 141 05/05/2020 1527   K 3.7 07/30/2020 2014   CL 106 07/30/2020 2014   CO2 26 07/30/2020 2014   GLUCOSE  119 (H) 07/30/2020 2014   BUN 17 07/30/2020 2014   BUN 11 05/05/2020 1527   CREATININE 0.85 07/30/2020 2014   CALCIUM 9.1 07/30/2020 2014   PROT 6.7 05/05/2020 1527   ALBUMIN 3.9 05/05/2020 1527   AST 10 05/05/2020 1527   ALT 14 05/05/2020 1527   ALKPHOS 109 05/05/2020 1527   BILITOT 0.2 05/05/2020 1527   GFRNONAA >60 07/30/2020 2014   GFRAA >60 05/12/2015 0626    No results found for: SPEP, UPEP  Lab Results  Component Value Date   WBC 9.2 07/30/2020   NEUTROABS 5.2 07/30/2020   HGB 14.3 07/30/2020   HCT 43.2 07/30/2020   MCV 95.2 07/30/2020   PLT 232 07/30/2020      Chemistry      Component Value Date/Time   NA 139 07/30/2020 2014   NA 141 05/05/2020 1527   K 3.7 07/30/2020 2014   CL 106 07/30/2020 2014   CO2 26 07/30/2020 2014   BUN 17 07/30/2020 2014   BUN 11 05/05/2020 1527   CREATININE 0.85 07/30/2020 2014      Component Value Date/Time   CALCIUM 9.1 07/30/2020 2014   ALKPHOS 109 05/05/2020 1527   AST 10 05/05/2020 1527   ALT 14 05/05/2020 1527   BILITOT 0.2 05/05/2020 1527       RADIOGRAPHIC STUDIES: I have personally reviewed the radiological images as listed and agreed with the findings in the report. No results found.   ASSESSMENT & PLAN:  Ductal carcinoma in situ (DCIS) of right breast # Right breast-lower outer- DCIS high-grade; with comedonecrosis; HW:[id number].  # Currently awaiting biopsy right upper  quadrant-x2 on 3/16; awaiting breast MRI on 3/20.  Evaluation with Dr. Christian Mate on 3/23.     # I had a long discussion with patient regarding pathology/natural history of DCIS.  Also had a long discussion regarding the treatment options which include lumpectomy.  Also discussed regarding radiation therapy post lumpectomy.  However if patient cannot have lumpectomy [awaiting above biopsies; also evaluation with Dr. Verita Lamb would be an option.  Patient would not receive radiation postmastectomy.    # Also discussed option of possible antihormone therapy-like anastrozole/Aromasin to cut down the risk of development development of ipsilateral/contralateral invasive/noninvasive breast cancer.  However ER status still pending.     # Genetics: Discussed with patient and family regarding potential etiologies of breast cancer-sporadic versus genetics.  I suspect patient's malignancy is sporadic. Discussed regarding evaluation with genetic counseling.    Patient agreement.  # DM [hemoglobin A1c] around 6 as per patient.  Monitor closely  # PN upper lower extremities/chronic swelling in the legs on Lasix [? DM]- post covid-stable.  # Thank you, Ms.Koositra CNM for allowing me to participate in the care of your pleasant patient. Please do not hesitate to contact me with questions or concerns in the interim.  DISPOSITION: #  No labs today.  # referral to [facility]- re: DCIS # follow up in 5 weeks-; MD; no labs-Dr.B   Orders Placed This Encounter  Procedures   Ambulatory referral to Genetics    Referral Priority:   Routine    Referral Type:   Consultation    Referral Reason:   Specialty Services Required    Number of Visits Requested:   1   All questions were answered. The patient knows to call the clinic with any problems, questions or concerns.      Cammie Sickle, MD 03/21/2021 4:14 PM

## 2021-03-22 ENCOUNTER — Telehealth: Payer: Self-pay | Admitting: Internal Medicine

## 2021-03-22 DIAGNOSIS — G5602 Carpal tunnel syndrome, left upper limb: Secondary | ICD-10-CM | POA: Diagnosis not present

## 2021-03-22 DIAGNOSIS — G5601 Carpal tunnel syndrome, right upper limb: Secondary | ICD-10-CM | POA: Diagnosis not present

## 2021-03-22 DIAGNOSIS — G5621 Lesion of ulnar nerve, right upper limb: Secondary | ICD-10-CM | POA: Diagnosis not present

## 2021-03-22 DIAGNOSIS — G5622 Lesion of ulnar nerve, left upper limb: Secondary | ICD-10-CM | POA: Diagnosis not present

## 2021-03-22 DIAGNOSIS — M65321 Trigger finger, right index finger: Secondary | ICD-10-CM | POA: Diagnosis not present

## 2021-03-22 NOTE — Telephone Encounter (Signed)
A- [phone]- ex 9/10-23- breast MRI approved ? ?

## 2021-03-24 ENCOUNTER — Other Ambulatory Visit: Payer: Self-pay

## 2021-03-24 ENCOUNTER — Ambulatory Visit
Admission: RE | Admit: 2021-03-24 | Discharge: 2021-03-24 | Disposition: A | Discharge status: Home / Self Care | Payer: 59 | Source: Ambulatory Visit | Attending: Student | Admitting: Student

## 2021-03-24 DIAGNOSIS — Z1231 Encounter for screening mammogram for malignant neoplasm of breast: Secondary | ICD-10-CM | POA: Diagnosis not present

## 2021-03-24 DIAGNOSIS — R921 Mammographic calcification found on diagnostic imaging of breast: Secondary | ICD-10-CM | POA: Diagnosis not present

## 2021-03-24 DIAGNOSIS — R928 Other abnormal and inconclusive findings on diagnostic imaging of breast: Secondary | ICD-10-CM | POA: Insufficient documentation

## 2021-03-24 DIAGNOSIS — D0511 Intraductal carcinoma in situ of right breast: Secondary | ICD-10-CM | POA: Diagnosis not present

## 2021-03-25 ENCOUNTER — Other Ambulatory Visit: Payer: Self-pay | Admitting: Student

## 2021-03-25 DIAGNOSIS — R928 Other abnormal and inconclusive findings on diagnostic imaging of breast: Secondary | ICD-10-CM

## 2021-03-25 DIAGNOSIS — N6489 Other specified disorders of breast: Secondary | ICD-10-CM

## 2021-03-25 LAB — SURGICAL PATHOLOGY

## 2021-03-28 ENCOUNTER — Ambulatory Visit
Admission: RE | Admit: 2021-03-28 | Discharge: 2021-03-28 | Disposition: A | Discharge status: Home / Self Care | Payer: 59 | Source: Ambulatory Visit | Attending: Internal Medicine | Admitting: Internal Medicine

## 2021-03-28 ENCOUNTER — Other Ambulatory Visit: Payer: Self-pay

## 2021-03-28 DIAGNOSIS — D0511 Intraductal carcinoma in situ of right breast: Secondary | ICD-10-CM | POA: Diagnosis not present

## 2021-03-28 MED ORDER — GADOBUTROL 1 MMOL/ML IV SOLN
10.0000 mL | Freq: Once | INTRAVENOUS | Status: AC | PRN
  Administered 2021-03-28: 10 mL via INTRAVENOUS

## 2021-03-30 ENCOUNTER — Other Ambulatory Visit: Payer: Self-pay | Admitting: Internal Medicine

## 2021-03-31 ENCOUNTER — Other Ambulatory Visit: Payer: Self-pay

## 2021-03-31 ENCOUNTER — Encounter: Payer: Self-pay | Admitting: Surgery

## 2021-03-31 ENCOUNTER — Ambulatory Visit (INDEPENDENT_AMBULATORY_CARE_PROVIDER_SITE_OTHER): Payer: 59 | Admitting: Surgery

## 2021-03-31 VITALS — BP 141/85 | HR 90 | Temp 98.7°F | Ht 63.0 in | Wt 326.0 lb

## 2021-03-31 DIAGNOSIS — D0511 Intraductal carcinoma in situ of right breast: Secondary | ICD-10-CM

## 2021-03-31 NOTE — Progress Notes (Signed)
Patient ID: Heather Cain, female   DOB: 1971/01/02, 51 y.o.   MRN: [id number] ? ?Chief Complaint: Multicentric DCIS right breast ? ?History of Present Illness ?Heather Cain is a 51 y.o. female with the above diagnosis after 2 stereotactic biopsies were performed of right breast lesions in the upper and lower outer quadrants.  She presents today following an MRI which is still pending a completed report.  We have discovered this is due to a pending ultrasound of her left breast. ?These were calcifications that had been monitored to this point. ?She denies any palpable masses, nipple discharge, skin changes or breast pain.  She does not do monthly breast examinations.  But she does get her screening mammography on schedule. ?Her maternal great-grandmother is her only source of family history of breast cancer.  She began menstruating at the age of 30.  She was pregnant once with 1 miscarriage.  She was 51 years old at her first pregnancy. ? ?Past Medical History ?Past Medical History:  ?Diagnosis Date  ? DCIS (ductal carcinoma in situ) of breast   ? Diabetes mellitus without complication ([place])   ? High cholesterol   ? Vaginal Pap smear, abnormal   ?  ? ? ?Past Surgical History:  ?Procedure Laterality Date  ? BREAST BIOPSY Right 03/08/2021  ? right breast bx coil clip -positive  ? BREAST BIOPSY Right 03/24/2021  ? stereo biopsy x clip/ path pending  ? CRYOTHERAPY    ? WRIST SURGERY    ? ? ?Allergies  ?Allergen Reactions  ? Codeine Itching and Anaphylaxis  ? ? ?Current Outpatient Medications  ?Medication Sig Dispense Refill  ? Accu-Chek Softclix Lancets lancets USE 1  TO CHECK GLUCOSE ONCE DAILY 200 each 3  ? albuterol (VENTOLIN HFA) 108 (90 Base) MCG/ACT inhaler Inhale 2 puffs into the lungs every 6 (six) hours as needed for wheezing or shortness of breath. 8 g 0  ? Cobalamin Combinations (B-12) [phone] MCG SUBL B12    ? fluticasone (FLONASE) 50 MCG/ACT nasal spray Place into both nostrils daily.    ? furosemide  (LASIX) 20 MG tablet Take 1 tablet by mouth once daily 30 tablet 0  ? gabapentin (NEURONTIN) 100 MG capsule TAKE 1 CAPSULE BY MOUTH TWICE DAILY FOR 7 DAYS THEN INCREASE TO 2 CAPS TWICE DAILY AND CONTINUE    ? glucose blood (ACCU-CHEK GUIDE) test strip USE 1 STRIP TO CHECK GLUCOSE ONCE DAILY 200 each 3  ? loratadine (CLARITIN) 10 MG tablet Take 10 mg by mouth daily.    ? rosuvastatin (CRESTOR) 20 MG tablet Take 1 tablet by mouth once daily 30 tablet 0  ? ?No current facility-administered medications for this visit.  ? ? ?Family History ?Family History  ?Problem Relation Age of Onset  ? Breast cancer Maternal Grandmother   ?  ? ? ?Social History ?Social History  ? ?Tobacco Use  ? Smoking status: Every Day  ?  Packs/day: 1.00  ?  Types: Cigarettes  ? Smokeless tobacco: Never  ?Vaping Use  ? Vaping Use: Some days  ?Substance Use Topics  ? Alcohol use: Yes  ? Drug use: Not Currently  ?  ? ?Review of Systems  ?Constitutional: Negative.   ?HENT: Negative.    ?Eyes: Negative.   ?Respiratory:  Positive for shortness of breath.   ?Cardiovascular: Negative.   ?Gastrointestinal: Negative.   ?Genitourinary: Negative.   ?Skin: Negative.   ?Neurological:  Positive for tingling (Neuropathy).  ?Psychiatric/Behavioral: Negative.    ?  ? ?  Physical Exam ?Blood pressure (!) 141/85, pulse 90, temperature 98.7 ?F (37.1 ?C), temperature source Oral, height '5\' 3"'$  (1.6 m), weight (!) 326 lb (147.9 kg), SpO2 96 %. ?Last Weight  Most recent update: 03/31/2021  2:10 PM  ? ? Weight  ?147.9 kg (326 lb)    ?      ? ?  ? ? ?CONSTITUTIONAL: Morbidly obese.  Well developed, and nourished, appropriately responsive and aware without distress.   ?EYES: Sclera non-icteric.   ?EARS, NOSE, MOUTH AND THROAT:  The oropharynx is clear. Oral mucosa is pink and moist.    Hearing is intact to voice.  ?NECK: Trachea is midline, and there is no jugular venous distension.  ?LYMPH NODES:  Lymph nodes in the neck are not appreciable. ?RESPIRATORY:  Lungs are clear,  and breath sounds are decreased but equal bilaterally. Normal respiratory effort without pathologic use of accessory muscles. ?CARDIOVASCULAR: Heart is regular in rate and rhythm. ?GI: The abdomen is  soft, nontender, and nondistended.  ?MUSCULOSKELETAL:  Symmetrical muscle tone appreciated in all four extremities.    ?SKIN: Skin turgor is normal. No pathologic skin lesions appreciated.  ?NEUROLOGIC:  Motor and sensation appear grossly normal.  Cranial nerves are grossly without defect. ?PSYCH:  Alert and oriented to person, place and time. Affect is appropriate for situation. ? ?Data Reviewed ?I have personally reviewed what is currently available of the patient's imaging, recent labs and medical records.   ?Labs:  ? ?  Latest Ref Rng & Units 07/30/2020  ?  8:14 PM 05/05/2020  ?  3:27 PM 05/12/2015  ?  6:26 AM  ?CBC  ?WBC 4.0 - 10.5 K/uL 9.2   7.8   8.3    ?Hemoglobin 12.0 - 15.0 g/dL 14.3   14.6   12.8    ?Hematocrit 36.0 - 46.0 % 43.2   42.4   36.9    ?Platelets 150 - 400 K/uL 232   239   205    ? ? ?  Latest Ref Rng & Units 07/30/2020  ?  8:14 PM 05/05/2020  ?  3:27 PM 05/12/2015  ?  6:26 AM  ?CMP  ?Glucose 70 - 99 mg/dL 119   138   104    ?BUN 6 - 20 mg/dL '17   11   11    '$ ?Creatinine 0.44 - 1.00 mg/dL 0.85   0.95   0.86    ?Sodium 135 - 145 mmol/L 139   141   140    ?Potassium 3.5 - 5.1 mmol/L 3.7   4.3   3.5    ?Chloride 98 - 111 mmol/L 106   102   107    ?CO2 22 - 32 mmol/L '26   21   26    '$ ?Calcium 8.9 - 10.3 mg/dL 9.1   8.8   8.5    ?Total Protein 6.0 - 8.5 g/dL  6.7     ?Total Bilirubin 0.0 - 1.2 mg/dL  0.2     ?Alkaline Phos 44 - 121 IU/L  109     ?AST 0 - 40 IU/L  10     ?ALT 0 - 32 IU/L  14     ? ?SURGICAL PATHOLOGY  ?CASE: [id number]  ?PATIENT: Heather Cain  ?Surgical Pathology Report  ? ?Specimen Submitted:  ?A. Breast, right upper outer quadrant; biopsy  ? ?Clinical History: Recent diagnosis of DCIS of right breast lower outer  ?quadrant (coil clip).  ?Similar-appearing calcifications right breast upper  outer quadrant  ?  biopsied today.  ?Post biopsy mammograms show appropriate positioning of the X shaped  ?biopsy marking clip at the  ?site of biopsy in the upper outer right breast.  ?No convincing residual calcifications.  ? ?DIAGNOSIS:  ?A. BREAST, RIGHT, UPPER OUTER QUADRANT; STEREOTACTIC-GUIDED CORE BIOPSY:  ?- DUCTAL CARCINOMA IN SITU (DCIS), INTERMEDIATE NUCLEAR GRADE WITHOUT  ?NECROSIS, SEE COMMENT.  ? ?Comment:  ?Additional deeper sections were reviewed.  ?No invasive carcinoma is identified.  ?There is focal DCIS arising in a background of flat epithelial atypia  ?and atypical ductal hyperplasia.  ?Calcifications are associated mostly with flat epithelial atypia.  ?Slides from the previous biopsy ([id number], 03/08/21) were reviewed.  ?In comparison with the previous, the current sample shows less extensive  ?involvement, lower nuclear grade, and absent necrosis.  ? ? ?Imaging: ?Radiology review:  ?CLINICAL DATA:  51 year old female presenting for six-month ?follow-up of probably benign right breast calcifications. ?  ?EXAM: ?DIGITAL DIAGNOSTIC UNILATERAL RIGHT MAMMOGRAM WITH TOMOSYNTHESIS AND ?CAD ?  ?TECHNIQUE: ?Right digital diagnostic mammography and breast tomosynthesis was ?performed. The images were evaluated with computer-aided detection. ?  ?COMPARISON:  Previous exam(s). ?  ?ACR Breast Density Category b: There are scattered areas of ?fibroglandular density. ?  ?FINDINGS: ?Spot 2D magnification views of the right breast were performed in ?addition to standard views. There is a small group of round and ?amorphous calcifications in the lower outer right breast that have ?increased in size compared to the prior study. These span overall ?0.6 cm. The remaining groups of calcifications in the upper central ?and upper outer right breast right breast are unchanged. ?  ?IMPRESSION: ?1. Indeterminate small group of calcifications in the lower outer ?right breast. ?  ?2. Stable probably benign  calcifications in the upper central and ?upper outer right breast. ?  ?RECOMMENDATION: ?Stereotactic core needle biopsy of the right breast x 1. ?  ?I have discussed the findings and recommendations with the patient.

## 2021-03-31 NOTE — Patient Instructions (Addendum)
Please call our office to schedule an appointment after you have the mammogram/Ultrasound and or biopsy if needed.  ? ? ?Please call Mammography if you have not heard from them. I have left all your information on their message line to see if we can move the Left Breast mammogram and Ultrasound done sooner. [phone]. ? ? ? ?Referral to Plastic Surgery has been sent. Someone from their office will call you to schedule an appointment.  ? ? ? ? ? ?Lumpectomy ?A lumpectomy, sometimes called a partial mastectomy, is surgery to remove a cancerous tumor or mass (the lump) from a breast. It is a form of breast-conserving or breast-preservation surgery. This means that the cancerous tissue is removed but the breast remains intact. ?During a lumpectomy, the portion of the breast that contains the tumor is removed. Some normal tissue around the lump may be taken out to make sure that all of the tumor has been removed. Lymph nodes under your arm may also be removed and tested to find out if the cancer has spread. Lymph nodes are part of the body's disease-fighting system (immune system) and are usually the first place where breast cancer spreads. ?Tell a health care provider about: ?Any allergies you have. ?All medicines you are taking, including vitamins, herbs, eye drops, creams, and over-the-counter medicines. ?Any problems you or family members have had with anesthetic medicines. ?Any blood disorders you have. ?Any surgeries you have had. ?Any medical conditions you have. ?Whether you are pregnant or may be pregnant. ?What are the risks? ?Generally, this is a safe procedure. However, problems may occur, including: ?Bleeding. ?Infection. ?Allergic reaction to medicines. ?Pain, swelling, weakness, or numbness in the arm on the side of your surgery. ?Temporary swelling. ?Change in the shape of the breast, particularly if a large portion is removed. ?Scar tissue that forms at the surgical site and feels hard to the  touch. ?Blood clots. ?What happens before the procedure? ?Staying hydrated ?Follow instructions from your health care provider about hydration, which may include: ?Up to 2 hours before the procedure - you may continue to drink clear liquids, such as water, clear fruit juice, black coffee, and plain tea. ? ?Eating and drinking restrictions ?Follow instructions from your health care provider about eating and drinking, which may include: ?8 hours before the procedure - stop eating heavy meals or foods, such as meat, fried foods, or fatty foods. ?6 hours before the procedure - stop eating light meals or foods, such as toast or cereal. ?6 hours before the procedure - stop drinking milk or drinks that contain milk. ?2 hours before the procedure - stop drinking clear liquids. ?Medicines ?Ask your health care provider about: ?Changing or stopping your regular medicines. This is especially important if you are taking diabetes medicines or blood thinners. ?Taking medicines such as aspirin and ibuprofen. These medicines can thin your blood. Do not take these medicines unless your health care provider tells you to take them. ?Taking over-the-counter medicines, vitamins, herbs, and supplements. ?General instructions ?Prior to surgery, your health care provider may do a procedure to locate and mark the tumor area in your breast (localization). This will help guide your surgeon to where the incision will be made. This may be done with: ?Imaging, such as a mammogram, ultrasound, or MRI. ?Insertion of a small wire, clip, or seed, or an implant that will reflect a radar signal. ?You may have screening tests or exams to get baseline measurements of your arm. These can be compared to  measurements done after surgery to monitor for swelling (lymphedema) that can develop after having lymph nodes removed. ?Ask your health care provider: ?How your surgery site will be marked. ?What steps will be taken to help prevent infection. These may  include: ?Washing skin with a germ-killing soap. ?Taking antibiotic medicine. ?Plan to have someone take you home from the hospital or clinic. ?Plan to have a responsible adult care for you for at least 24 hours after you leave the hospital or clinic. This is important. ?What happens during the procedure? ? ?An IV will be inserted into one of your veins. ?You will be given one or more of the following: ?A medicine to help you relax (sedative). ?A medicine to numb the area (local anesthetic). ?A medicine to make you fall asleep (general anesthetic). ?Your health care provider will use a kind of electric scalpel that uses heat to reduce bleeding (electrocautery knife). A curved incision that follows the natural curve of your breast will be made. This type of incision will allow for minimal scarring and better healing. ?The tumor will be removed along with some of the tissue around it. This will be sent to the lab for testing. Your health care provider may also remove lymph nodes at this time if needed. ?If the tumor is close to the muscles over your chest, some muscle tissue may also be removed. ?A small drain tube may be inserted into your breast area or armpit to collect fluid that may build up after surgery. This tube will be connected to a suction bulb on the outside of your body to remove the fluid. ?The incision will be closed with stitches (sutures). ?A bandage (dressing) may be placed over the incision. ?The procedure may vary among health care providers and hospitals. ?What happens after the procedure? ?Your blood pressure, heart rate, breathing rate, and blood oxygen level will be monitored until you leave the hospital or clinic. ?You will be given medicine for pain as needed. ?Your IV will be removed when you are able to eat and drink by mouth. ?You will be encouraged to get up and walk as soon as you can. This is important to improve blood flow and breathing. Ask for help if you feel weak or unsteady. ?You  may have: ?A drain tube in place for 2-3 days to prevent a collection of blood (hematoma) from developing in the breast. You will be given instructions about caring for the drain before you go home. ?A pressure bandage applied for 1-2 days to prevent bleeding or swelling. Your pressure bandage may look like a thick piece of fabric or an elastic wrap. Ask your health care provider how to care for your bandage at home. ?You may be given a tight sleeve to wear over your arm on the side of your surgery. You should wear this sleeve as told by your health care provider. ?Do not drive for 24 hours if you were given a sedative during your procedure. ?Summary ?A lumpectomy, sometimes called a partial mastectomy, is surgery to remove a cancerous tumor or mass (the lump) from a breast. ?During a lumpectomy, the portion of the breast that contains the tumor is removed. Lymph nodes under your arm may also be removed and tested to find out if the cancer has spread. ?Plan to have someone take you home from the hospital or clinic. ?You may have a drain tube in place for 2-3 days to prevent a collection of blood (hematoma) from developing in the breast. You  will be given instructions about caring for the drain before you go home. ?This information is not intended to replace advice given to you by your health care provider. Make sure you discuss any questions you have with your health care provider. ?Document Revised: 07/01/2018 Document Reviewed: 07/01/2018 ?Elsevier Patient Education ? 2022 [place]. ? ? ? ? ? ? ?Skin Sparing Mastectomy ?A mastectomy is a surgery to remove a breast. Skin sparing mastectomy (SSM) is a technique that can be used during a mastectomy. When this technique is used, all of the breast skin, except the areola and the nipple, is left intact. The technique allows the breast to be reconstructed during the surgery. Most women with breast cancer can have this type of surgery if needed. ?Tell a health care  provider about: ?Any allergies you have. ?All medicines you are taking, including vitamins, herbs, eye drops, creams, and over-the-counter medicines. ?Any problems you or family members have had with anesthetic

## 2021-04-01 ENCOUNTER — Ambulatory Visit: Payer: 59 | Admitting: Surgery

## 2021-04-05 ENCOUNTER — Ambulatory Visit
Admission: RE | Admit: 2021-04-05 | Discharge: 2021-04-05 | Disposition: A | Discharge status: Home / Self Care | Payer: 59 | Source: Ambulatory Visit | Attending: Student | Admitting: Student

## 2021-04-05 ENCOUNTER — Other Ambulatory Visit: Payer: Self-pay

## 2021-04-05 DIAGNOSIS — N6489 Other specified disorders of breast: Secondary | ICD-10-CM | POA: Insufficient documentation

## 2021-04-05 DIAGNOSIS — R922 Inconclusive mammogram: Secondary | ICD-10-CM | POA: Diagnosis not present

## 2021-04-05 DIAGNOSIS — R928 Other abnormal and inconclusive findings on diagnostic imaging of breast: Secondary | ICD-10-CM | POA: Insufficient documentation

## 2021-04-06 ENCOUNTER — Other Ambulatory Visit: Payer: Self-pay | Admitting: Student

## 2021-04-06 ENCOUNTER — Ambulatory Visit (INDEPENDENT_AMBULATORY_CARE_PROVIDER_SITE_OTHER): Payer: 59 | Admitting: Plastic Surgery

## 2021-04-06 ENCOUNTER — Other Ambulatory Visit: Payer: Self-pay | Admitting: *Deleted

## 2021-04-06 ENCOUNTER — Other Ambulatory Visit: Payer: Self-pay

## 2021-04-06 DIAGNOSIS — N6489 Other specified disorders of breast: Secondary | ICD-10-CM

## 2021-04-06 DIAGNOSIS — D0511 Intraductal carcinoma in situ of right breast: Secondary | ICD-10-CM

## 2021-04-06 DIAGNOSIS — R928 Other abnormal and inconclusive findings on diagnostic imaging of breast: Secondary | ICD-10-CM

## 2021-04-06 DIAGNOSIS — R921 Mammographic calcification found on diagnostic imaging of breast: Secondary | ICD-10-CM

## 2021-04-06 NOTE — Progress Notes (Signed)
? ?Referring Provider ?Cletis Athens, MD ?[address] ?[place],  [place] [address]  ? ?CC:  ?Chief Complaint  ?Patient presents with  ? Advice Only  ?   ? ?Heather Cain is an 51 y.o. female.  ?HPI: Patient presents to discuss breast reconstruction.  She had a recent diagnosis of DCIS in the right breast.  It is multifocal disease and looks like it is going to require mastectomy.  She also has pending analysis on the left breast and may have further disease on that side that is yet to be determined.  She has seen Dr. Christian Mate who sent her here to me.  She currently smokes about a pack a day and has diabetes with a hemoglobin A1c of around 6.3.  Her BMI is 57. ? ?Allergies  ?Allergen Reactions  ? Codeine Itching and Anaphylaxis  ? ? ?Outpatient Encounter Medications as of 04/06/2021  ?Medication Sig Note  ? Accu-Chek Softclix Lancets lancets USE 1  TO CHECK GLUCOSE ONCE DAILY   ? albuterol (VENTOLIN HFA) 108 (90 Base) MCG/ACT inhaler Inhale 2 puffs into the lungs every 6 (six) hours as needed for wheezing or shortness of breath. 04/06/2021: Pt reports she only uses during an allergy flare up  ? Cobalamin Combinations (B-12) [phone] MCG SUBL B12   ? fluticasone (FLONASE) 50 MCG/ACT nasal spray Place into both nostrils daily.   ? furosemide (LASIX) 20 MG tablet Take 1 tablet by mouth once daily   ? gabapentin (NEURONTIN) 100 MG capsule TAKE 1 CAPSULE BY MOUTH TWICE DAILY FOR 7 DAYS THEN INCREASE TO 2 CAPS TWICE DAILY AND CONTINUE   ? glimepiride (AMARYL) 2 MG tablet Take 2 mg by mouth every morning.   ? glucose blood (ACCU-CHEK GUIDE) test strip USE 1 STRIP TO CHECK GLUCOSE ONCE DAILY   ? loratadine (CLARITIN) 10 MG tablet Take 10 mg by mouth daily.   ? rosuvastatin (CRESTOR) 20 MG tablet Take 1 tablet by mouth once daily   ? ?No facility-administered encounter medications on file as of 04/06/2021.  ?  ? ?Past Medical History:  ?Diagnosis Date  ? DCIS (ductal carcinoma in situ) of breast   ? Diabetes mellitus without  complication ([place])   ? High cholesterol   ? Vaginal Pap smear, abnormal   ? ? ?Past Surgical History:  ?Procedure Laterality Date  ? BREAST BIOPSY Right 03/08/2021  ? right breast bx coil clip -positive  ? BREAST BIOPSY Right 03/24/2021  ? stereo biopsy x clip/DUCTAL CARCINOMA IN SITU (DCIS),  ? CRYOTHERAPY    ? WRIST SURGERY    ? ? ?Family History  ?Problem Relation Age of Onset  ? Breast cancer Maternal Grandmother   ? ? ?Social History  ? ?Social History Narrative  ? 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   ?  ? ?Review of Systems ?General: Denies fevers, chills, weight loss ?CV: Denies chest pain, shortness of breath, palpitations ? ?Physical Exam ? ?  03/31/2021  ?  2:07 PM 03/21/2021  ?  1:55 PM 07/31/2020  ?  7:11 AM  ?Vitals with BMI  ?Height '5\' 3"'$  '5\' 3"'$    ?Weight 326 lbs 332 lbs 3 oz   ?BMI 57.76 58.86   ?Systolic 353 299 242  ?Diastolic 85 70 65  ?Pulse 90 97 79  ?  ?General:  No acute distress,  Alert and oriented, Non-Toxic, Normal speech and affect ?Breast: She has grade 3 ptosis.  She is larger on the right  than the left. ? ?Assessment/Plan ?I had a long discussion with the patient about her options.  Ultimately I do not think she is a very good candidate for immediate reconstruction.  The combination of a high BMI with daily smoking puts her at a high risk for wound healing complications that might potentially delay her cancer treatment if anything additional is needed.  I think the risk profile for delayed reconstruction would be more favorable.  We discussed the details of implant-based and autologous reconstruction.  I do not think she would be a very good candidate for autologous reconstruction due to her body habitus and the risk of complications at the donor site.  I believe her best solution would be delayed implant-based reconstruction which we discussed today.  We discussed it would be staged procedure and the expander would be placed and inflated and  subsequently switched out for gel implant.  She seemed understanding of that and we will plan to follow-up with her after her mastectomy is carried out and all adjuvant cancer treatment is completed.  She is understanding and all of her questions were answered. ? ?Cindra Presume ?04/06/2021, 5:47 PM  ? ? ?  ?

## 2021-04-11 DIAGNOSIS — R062 Wheezing: Secondary | ICD-10-CM | POA: Diagnosis not present

## 2021-04-11 DIAGNOSIS — B9689 Other specified bacterial agents as the cause of diseases classified elsewhere: Secondary | ICD-10-CM | POA: Diagnosis not present

## 2021-04-11 DIAGNOSIS — J069 Acute upper respiratory infection, unspecified: Secondary | ICD-10-CM | POA: Diagnosis not present

## 2021-04-12 DIAGNOSIS — D0511 Intraductal carcinoma in situ of right breast: Secondary | ICD-10-CM

## 2021-04-12 DIAGNOSIS — N6320 Unspecified lump in the left breast, unspecified quadrant: Secondary | ICD-10-CM

## 2021-04-13 ENCOUNTER — Other Ambulatory Visit: Payer: Self-pay | Admitting: Student

## 2021-04-13 ENCOUNTER — Inpatient Hospital Stay: Payer: 59

## 2021-04-13 ENCOUNTER — Other Ambulatory Visit: Payer: Self-pay | Admitting: Oncology

## 2021-04-13 DIAGNOSIS — N6489 Other specified disorders of breast: Secondary | ICD-10-CM

## 2021-04-13 DIAGNOSIS — R921 Mammographic calcification found on diagnostic imaging of breast: Secondary | ICD-10-CM

## 2021-04-13 DIAGNOSIS — D0511 Intraductal carcinoma in situ of right breast: Secondary | ICD-10-CM

## 2021-04-13 DIAGNOSIS — R928 Other abnormal and inconclusive findings on diagnostic imaging of breast: Secondary | ICD-10-CM

## 2021-04-13 DIAGNOSIS — N6320 Unspecified lump in the left breast, unspecified quadrant: Secondary | ICD-10-CM

## 2021-04-14 ENCOUNTER — Other Ambulatory Visit: Payer: 59

## 2021-04-14 NOTE — Progress Notes (Signed)
Additional biopsies scheduled.  MRI biopsy in [place] on 04/27/21.  Notified Jamie Buntin at [facility] of appointment in order to schedule stereotactic biopsy afterwards.  Patient given MRI biopsy appointment information. ?

## 2021-04-15 DIAGNOSIS — R0602 Shortness of breath: Secondary | ICD-10-CM | POA: Diagnosis not present

## 2021-04-15 DIAGNOSIS — J22 Unspecified acute lower respiratory infection: Secondary | ICD-10-CM | POA: Diagnosis not present

## 2021-04-15 DIAGNOSIS — J029 Acute pharyngitis, unspecified: Secondary | ICD-10-CM | POA: Diagnosis not present

## 2021-04-15 DIAGNOSIS — R062 Wheezing: Secondary | ICD-10-CM | POA: Diagnosis not present

## 2021-04-25 ENCOUNTER — Inpatient Hospital Stay: Payer: 59 | Admitting: Internal Medicine

## 2021-04-25 ENCOUNTER — Other Ambulatory Visit: Payer: Self-pay | Admitting: Internal Medicine

## 2021-04-27 ENCOUNTER — Ambulatory Visit: Payer: 59

## 2021-04-27 ENCOUNTER — Other Ambulatory Visit: Payer: Self-pay | Admitting: Oncology

## 2021-04-27 ENCOUNTER — Ambulatory Visit
Admission: RE | Admit: 2021-04-27 | Discharge: 2021-04-27 | Disposition: A | Discharge status: Home / Self Care | Payer: 59 | Source: Ambulatory Visit | Attending: Oncology | Admitting: Oncology

## 2021-04-27 DIAGNOSIS — C50911 Malignant neoplasm of unspecified site of right female breast: Secondary | ICD-10-CM | POA: Diagnosis not present

## 2021-04-27 DIAGNOSIS — D0511 Intraductal carcinoma in situ of right breast: Secondary | ICD-10-CM

## 2021-04-27 DIAGNOSIS — N6320 Unspecified lump in the left breast, unspecified quadrant: Secondary | ICD-10-CM

## 2021-04-27 MED ORDER — GADOBUTROL 1 MMOL/ML IV SOLN
10.0000 mL | Freq: Once | INTRAVENOUS | Status: AC | PRN
  Administered 2021-04-27: 10 mL via INTRAVENOUS

## 2021-04-28 ENCOUNTER — Other Ambulatory Visit: Payer: Self-pay | Admitting: Internal Medicine

## 2021-04-28 DIAGNOSIS — R928 Other abnormal and inconclusive findings on diagnostic imaging of breast: Secondary | ICD-10-CM

## 2021-04-28 DIAGNOSIS — R921 Mammographic calcification found on diagnostic imaging of breast: Secondary | ICD-10-CM

## 2021-04-28 DIAGNOSIS — N6489 Other specified disorders of breast: Secondary | ICD-10-CM

## 2021-05-03 ENCOUNTER — Encounter: Payer: Self-pay | Admitting: Licensed Clinical Social Worker

## 2021-05-03 ENCOUNTER — Inpatient Hospital Stay: Payer: 59

## 2021-05-03 ENCOUNTER — Inpatient Hospital Stay: Payer: 59 | Attending: Internal Medicine | Admitting: Licensed Clinical Social Worker

## 2021-05-03 DIAGNOSIS — D0511 Intraductal carcinoma in situ of right breast: Secondary | ICD-10-CM | POA: Diagnosis not present

## 2021-05-03 DIAGNOSIS — Z803 Family history of malignant neoplasm of breast: Secondary | ICD-10-CM | POA: Diagnosis not present

## 2021-05-03 DIAGNOSIS — Z8049 Family history of malignant neoplasm of other genital organs: Secondary | ICD-10-CM | POA: Insufficient documentation

## 2021-05-03 DIAGNOSIS — Z8052 Family history of malignant neoplasm of bladder: Secondary | ICD-10-CM | POA: Insufficient documentation

## 2021-05-03 NOTE — Progress Notes (Signed)
REFERRING PROVIDER: ?Heather Sickle, MD ?[place],  [place] [address] ? ?PRIMARY PROVIDER:  ?Heather Athens, MD ? ?PRIMARY REASON FOR VISIT:  ?1. Ductal carcinoma in situ (DCIS) of right breast   ?2. Family history of breast cancer   ? ? ? ?HISTORY OF PRESENT ILLNESS:   ?Heather Cain, a 51 y.o. female, was seen for a [facility] genetics consultation at the request of Dr. Rogue Cain due to a personal and family history of breast cancer.  Heather Cain presents to clinic today to discuss the possibility of a hereditary predisposition to cancer, genetic testing, and to further clarify her future cancer risks, as well as potential cancer risks for family members.  ? ?In 2023, at the age of 51, Heather Cain was diagnosed with DCIS of the right breast. The treatment plan is still being determined although she does plan at least a right mastectomy and is thinking about bilateral mastectomy.  ? ? ?CANCER HISTORY:  ?Oncology History Overview Note  ?MAY 2022- [screening]- RECOMMENDATION: ? ?# FEB 2023-  ?  ?2. Stable probably benign calcifications in the upper central and ?upper outer right breast. ?  ?RECOMMENDATION: ?Stereotactic core needle biopsy of the right breast x 1.DIAGNOSIS:  ?A. 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  ?- DCIS IS PRESENT IN 5 OF 6 BLOCKS.  ?  ?Ductal carcinoma in situ (DCIS) of right breast  ?03/15/2021 Initial Diagnosis  ? Ductal carcinoma in situ (DCIS) of right breast ?  ?03/15/2021 Cancer Staging  ? Staging form: Breast, AJCC 8th Edition ?- Clinical: cTis (DCIS) - Signed by Heather Sickle, MD on 03/15/2021 ? ?  ? ? ? ?RISK FACTORS:  ?Menarche was at age 58.  ?First live birth at age 33 - miscarriage ?OCP use for approximately  14  years.  ?Ovaries intact: yes.  ?Hysterectomy: no.  ?Menopausal status: perimenopausal.  ?HRT use: 0 years. ?Colonoscopy:  no; not examined. ?Mammogram within the last year: yes. ?Number of breast biopsies: 1. ?Up to date with pelvic exams: yes. ? ?Past Medical History:  ?Diagnosis Date  ? DCIS (ductal carcinoma in situ) of breast   ? Diabetes mellitus without complication ([place])   ? High cholesterol   ? Vaginal Pap smear, abnormal   ? ? ?Past Surgical History:  ?Procedure Laterality Date  ? BREAST BIOPSY Right 03/08/2021  ? right breast bx coil clip -positive  ? BREAST BIOPSY Right 03/24/2021  ? stereo biopsy x clip/DUCTAL CARCINOMA IN SITU (DCIS),  ? CRYOTHERAPY    ? WRIST SURGERY    ? ? ?Social History  ? ?Socioeconomic History  ? Marital status: Single  ?  Spouse name: Not on file  ? Number of children: Not on file  ? Years of education: Not on file  ? Highest education level: Not on file  ?Occupational History  ? Not on file  ?Tobacco Use  ? Smoking status: Every Day  ?  Packs/day: 1.00  ?  Types: Cigarettes  ? Smokeless tobacco: Never  ?Vaping Use  ? Vaping Use: Some days  ?Substance and Sexual Activity  ? Alcohol use: Yes  ? Drug use: Not Currently  ? Sexual activity: Not Currently  ?  Birth control/protection: None  ?Other Topics Concern  ? Not on file  ?Social History Narrative  ? 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   ? ?Social  Determinants of Health  ? ?Financial Resource Strain: Not on file  ?Food Insecurity: Not on file  ?Transportation Needs: Not on file  ?Physical Activity: Not on file  ?Stress: Not on file  ?Social Connections: Not on file  ?  ? ?FAMILY HISTORY:  ?We obtained a detailed, 4-generation family history.  Significant diagnoses are listed below: ?Family History  ?Problem Relation Age of Onset  ? Breast cancer Maternal Grandmother   ? ?Heather Cain has 1 sister, 47, no history of cancer and 1 niece.  ? ?Heather Cain's mother had uterine cancer at 53 and is living at 68. Patient has 2 maternal aunts and 1 uncle. Her aunt has had a lot of skin cancers removed, also had a lot  of sun exposure. Maternal grandmother died in her late 80s. Grandfather possibly had colon cancer, died in his 80s-90s. His mother, patient's maternal great grandmother, had breast cancer.  ? ?[name]'s father had bladder cancer at 66 and history of smoking, although he quit smoking in his 40s. Patient has 2 paternal aunts. Grandmother died at 100, grandfather died at 60.  ? ?[name] is unaware of previous family history of genetic testing for hereditary cancer risks. Patient's maternal ancestors are of German and English descent, and paternal ancestors are of Scottish, Irish and English descent. There is no reported Ashkenazi Jewish ancestry. There is no known consanguinity. ? ? ? ?GENETIC COUNSELING ASSESSMENT: Heather Cain is a 50 y.o. female with a personal and family history of breast cancer which is somewhat suggestive of a hereditary cancer syndrome and predisposition to cancer. We, therefore, discussed and recommended the following at today's visit.  ? ?DISCUSSION: We discussed that approximately 10% of breast cancer is hereditary. Most cases of hereditary breast cancer are associated with BRCA1/BRCA2 genes, although there are other genes associated with hereditary cancer as well. Cancers and risks are gene specific. We discussed that testing is beneficial for several reasons including knowing about cancer risks, identifying potential screening and risk-reduction options that may be appropriate, and to understand if other family members could be at risk for cancer and allow them to undergo genetic testing.  ? ?We reviewed the characteristics, features and inheritance patterns of hereditary cancer syndromes. We also discussed genetic testing, including the appropriate family members to test, the process of testing, insurance coverage and turn-around-time for results. We discussed the implications of a negative, positive and/or variant of uncertain significant result. We recommended Heather Cain pursue  genetic testing for the [facility] BRCAPlus+CancerNext-Expanded+RNA gene panel.  ? ?Based on Heather Cain's personal history of cancer, she meets medical criteria for genetic testing. Despite that she meets criteria, she may still have an out of pocket cost. We discussed that if her out of pocket cost for testing is over $100, the laboratory will call and confirm whether she wants to proceed with testing.  If the out of pocket cost of testing is less than $100 she will be billed by the genetic testing laboratory.  ? ?PLAN: After considering the risks, benefits, and limitations, Heather Cain provided informed consent to pursue genetic testing and the blood sample was sent to [facility] for analysis of the BRCAPlus+CancerNext-Expanded+RNA panel. Initial results should be available within approximately 1 weeks' time, at which point they will be disclosed by telephone to Heather Cain, as will any additional recommendations warranted by these results. Heather Cain will receive a summary of her genetic counseling visit and a copy of her results once available. This information will also   be available in Epic.  ? ?Heather Cain's questions were answered to her satisfaction today. Our contact information was provided should additional questions or concerns arise. Thank you for the referral and allowing us to share in the care of your patient.  ? ?Brianna Cowan, MS, LCGC ?Genetic Counselor ?[email or web address] ?Phone: [phone] ? ?The patient was seen for a total of 30 minutes in face-to-face genetic counseling.  Dr. Finnegan was available for discussion regarding this case.  ? ?_______________________________________________________________________ ?For Office Staff:  ?Number of people involved in session: 1 ?Was an Intern/ student involved with case: no ? ?

## 2021-05-10 ENCOUNTER — Ambulatory Visit: Payer: 59 | Admitting: Internal Medicine

## 2021-05-11 ENCOUNTER — Inpatient Hospital Stay: Payer: 59 | Attending: Internal Medicine

## 2021-05-11 ENCOUNTER — Telehealth: Payer: Self-pay | Admitting: Licensed Clinical Social Worker

## 2021-05-11 DIAGNOSIS — E119 Type 2 diabetes mellitus without complications: Secondary | ICD-10-CM | POA: Insufficient documentation

## 2021-05-11 DIAGNOSIS — Z7984 Long term (current) use of oral hypoglycemic drugs: Secondary | ICD-10-CM | POA: Insufficient documentation

## 2021-05-11 DIAGNOSIS — Z79899 Other long term (current) drug therapy: Secondary | ICD-10-CM | POA: Insufficient documentation

## 2021-05-11 DIAGNOSIS — F17210 Nicotine dependence, cigarettes, uncomplicated: Secondary | ICD-10-CM | POA: Insufficient documentation

## 2021-05-11 DIAGNOSIS — F419 Anxiety disorder, unspecified: Secondary | ICD-10-CM | POA: Insufficient documentation

## 2021-05-11 DIAGNOSIS — D0511 Intraductal carcinoma in situ of right breast: Secondary | ICD-10-CM | POA: Insufficient documentation

## 2021-05-11 NOTE — Telephone Encounter (Signed)
Disclosed negative genetic test results on the [facility] BRCAPlus 8 gene panel. The remainder of testing is pending and I will call Heather Cain with those results once they are back.  ? ? ?

## 2021-05-17 ENCOUNTER — Telehealth: Payer: Self-pay | Admitting: Licensed Clinical Social Worker

## 2021-05-17 ENCOUNTER — Encounter: Payer: Self-pay | Admitting: Licensed Clinical Social Worker

## 2021-05-17 ENCOUNTER — Ambulatory Visit: Payer: Self-pay | Admitting: Licensed Clinical Social Worker

## 2021-05-17 DIAGNOSIS — Z1379 Encounter for other screening for genetic and chromosomal anomalies: Secondary | ICD-10-CM

## 2021-05-17 DIAGNOSIS — Z1211 Encounter for screening for malignant neoplasm of colon: Secondary | ICD-10-CM | POA: Insufficient documentation

## 2021-05-17 NOTE — Progress Notes (Signed)
HPI:  Heather Cain was previously seen in the [place] clinic due to a family history of cancer and concerns regarding a hereditary predisposition to cancer. Please refer to our prior cancer genetics clinic note for more information regarding our discussion, assessment and recommendations, at the time. Heather Cain recent genetic test results were disclosed to her, as were recommendations warranted by these results. These results and recommendations are discussed in more detail below. ? ?CANCER HISTORY:  ?Oncology History Overview Note  ?MAY 2022- [screening]- RECOMMENDATION: ? ?# FEB 2023-  ?  ?2. Stable probably benign calcifications in the upper central and ?upper outer right breast. ?  ?RECOMMENDATION: ?Stereotactic core needle biopsy of the right breast x 1.DIAGNOSIS:  ?A. 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  ?- DCIS IS PRESENT IN 5 OF 6 BLOCKS.  ?  ?Ductal carcinoma in situ (DCIS) of right breast  ?03/15/2021 Initial Diagnosis  ? Ductal carcinoma in situ (DCIS) of right breast ?  ?03/15/2021 Cancer Staging  ? Staging form: Breast, AJCC 8th Edition ?- Clinical: cTis (DCIS) - Signed by Cammie Sickle, MD on 03/15/2021 ? ?  ? Genetic Testing  ? Negative genetic testing. No pathogenic variants identified on the [facility] CancerNext-Expanded+RNA panel. The report date is 05/12/2021. ? ?The CancerNext-Expanded + RNAinsight gene panel offered by [employer] and includes sequencing and rearrangement analysis for the following 77 genes: IP, ALK, APC*, ATM*, AXIN2, BAP1, BARD1, BLM, BMPR1A, BRCA1*, BRCA2*, BRIP1*, CDC73, CDH1*,CDK4, CDKN1B, CDKN2A, CHEK2*, CTNNA1, DICER1, FANCC, FH, FLCN, GALNT12, KIF1B, LZTR1, MAX, MEN1, MET, MLH1*, MSH2*, MSH3, MSH6*, MUTYH*, NBN, NF1*, NF2, NTHL1, PALB2*, PHOX2B, PMS2*, POT1, PRKAR1A, PTCH1, PTEN*, RAD51C*, RAD51D*,RB1, RECQL, RET, SDHA, SDHAF2,  SDHB, SDHC, SDHD, SMAD4, SMARCA4, SMARCB1, SMARCE1, STK11, SUFU, TMEM127, TP53*,TSC1, TSC2, VHL and XRCC2 (sequencing and deletion/duplication); EGFR, EGLN1, HOXB13, KIT, MITF, PDGFRA, POLD1 and POLE (sequencing only); EPCAM and GREM1 (deletion/duplication only). ?  ? ? ?FAMILY HISTORY:  ?We obtained a detailed, 4-generation family history.  Significant diagnoses are listed below: ?Family History  ?Problem Relation Age of Onset  ? Uterine cancer Mother 63  ? Bladder Cancer Father 65  ? Skin cancer Maternal Aunt   ? Cancer - Other Maternal Grandfather   ?     possible colon cancer  ? Breast cancer Maternal Great-grandmother   ? ? ? ?Heather Cain has 1 sister, 76, no history of cancer and 1 niece.  ?  ?Heather Cain's mother had uterine cancer at 43 and is living at 31. Patient has 2 maternal aunts and 1 uncle. Her aunt has had a lot of skin cancers removed, also had a lot of sun exposure. Maternal grandmother died in her late 30s. Grandfather possibly had colon cancer, died in his 46s-90s. His mother, patient's maternal great grandmother, had breast cancer.  ?  ?Heather Cain's father had bladder cancer at 64 and history of smoking, although he quit smoking in his 57s. Patient has 2 paternal aunts. Grandmother died at 67, grandfather died at 2.  ?  ?Heather Cain is unaware of previous family history of genetic testing for hereditary cancer risks. Patient's maternal ancestors are of [place] and [place] descent, and paternal ancestors are of [place], [place] and [place] descent. There is no reported Ashkenazi Jewish ancestry. There is no known consanguinity. ?  ? ?GENETIC TEST RESULTS: Genetic testing reported out on 05/12/2021 through the [employer] CancerNext-Expanded+RNA cancer panel found no pathogenic mutations.  ? ?  The CancerNext-Expanded + RNAinsight gene panel offered by [employer] and includes sequencing and rearrangement analysis for the following 77 genes: IP, ALK, APC*, ATM*, AXIN2, BAP1, BARD1, BLM, BMPR1A, BRCA1*,  BRCA2*, BRIP1*, CDC73, CDH1*,CDK4, CDKN1B, CDKN2A, CHEK2*, CTNNA1, DICER1, FANCC, FH, FLCN, GALNT12, KIF1B, LZTR1, MAX, MEN1, MET, MLH1*, MSH2*, MSH3, MSH6*, MUTYH*, NBN, NF1*, NF2, NTHL1, PALB2*, PHOX2B, PMS2*, POT1, PRKAR1A, PTCH1, PTEN*, RAD51C*, RAD51D*,RB1, RECQL, RET, SDHA, SDHAF2, SDHB, SDHC, SDHD, SMAD4, SMARCA4, SMARCB1, SMARCE1, STK11, SUFU, TMEM127, TP53*,TSC1, TSC2, VHL and XRCC2 (sequencing and deletion/duplication); EGFR, EGLN1, HOXB13, KIT, MITF, PDGFRA, POLD1 and POLE (sequencing only); EPCAM and GREM1 (deletion/duplication only).  ? ?The test report has been scanned into EPIC and is located under the Molecular Pathology section of the Results Review tab.  A portion of the result report is included below for reference.  ? ? ? ?We discussed that because current genetic testing is not perfect, it is possible there may be a gene mutation in one of these genes that current testing cannot detect, but that chance is small.  There could be another gene that has not yet been discovered, or that we have not yet tested, that is responsible for the cancer diagnoses in the family. It is also possible there is a hereditary cause for the cancer in the family that Heather Cain did not inherit and therefore was not identified in her testing.  Therefore, it is important to remain in touch with cancer genetics in the future so that we can continue to offer Heather Cain the most up to date genetic testing.  ? ?ADDITIONAL GENETIC TESTING: We discussed with Heather Cain that her genetic testing was fairly extensive.  If there are genes identified to increase cancer risk that can be analyzed in the future, we would be happy to discuss and coordinate this testing at that time.   ? ?CANCER SCREENING RECOMMENDATIONS: Heather Cain test result is considered negative (normal).  This means that we have not identified a hereditary cause for her  personal and family history of cancer at this time. Most cancers happen by chance and this  negative test suggests that her cancer may fall into this category.   ? ?While reassuring, this does not definitively rule out a hereditary predisposition to cancer. It is still possible that there could be genetic mutations that are undetectable by current technology. There could be genetic mutations in genes that have not been tested or identified to increase cancer risk.  Therefore, it is recommended she continue to follow the cancer management and screening guidelines provided by her oncology and primary healthcare provider.  ? ?An individual's cancer risk and medical management are not determined by genetic test results alone. Overall cancer risk assessment incorporates additional factors, including personal medical history, family history, and any available genetic information that may result in a personalized plan for cancer prevention and surveillance. ? ?RECOMMENDATIONS FOR FAMILY MEMBERS:  Relatives in this family might be at some increased risk of developing cancer, over the general population risk, simply due to the family history of cancer.  We recommended female relatives in this family have a yearly mammogram beginning at age 9, or 24 years younger than the earliest onset of cancer, an annual clinical breast exam, and perform monthly breast self-exams. Female relatives in this family should also have a gynecological exam as recommended by their primary provider.  All family members should be referred for colonoscopy starting at age 43.  ? ?FOLLOW-UP: Lastly, we discussed with Heather Cain that cancer  genetics is a rapidly advancing field and it is possible that new genetic tests will be appropriate for her and/or her family members in the future. We encouraged her to remain in contact with cancer genetics on an annual basis so we can update her personal and family histories and let her know of advances in cancer genetics that may benefit this family.  ? ?Our contact number was provided. Heather Cain  questions were answered to her satisfaction, and she knows she is welcome to call us at anytime with additional questions or concerns.  ? ?Faith Rogue, MS, LCGC ?Genetic Counselor ?[email or web address]

## 2021-05-17 NOTE — Telephone Encounter (Signed)
Revealed negative genetic testing.  This normal result is reassuring and indicates that it is unlikely Heather Cain's cancer is due to a hereditary cause.  It is unlikely that there is an increased risk of another cancer due to a mutation in one of these genes.  However, genetic testing is not perfect, and cannot definitively rule out a hereditary cause.  It will be important for her to keep in contact with genetics to learn if any additional testing may be needed in the future.    ? ?

## 2021-05-18 ENCOUNTER — Ambulatory Visit
Admission: RE | Admit: 2021-05-18 | Discharge: 2021-05-18 | Disposition: A | Discharge status: Home / Self Care | Payer: 59 | Source: Ambulatory Visit | Attending: Internal Medicine | Admitting: Internal Medicine

## 2021-05-18 DIAGNOSIS — R928 Other abnormal and inconclusive findings on diagnostic imaging of breast: Secondary | ICD-10-CM

## 2021-05-18 DIAGNOSIS — N6489 Other specified disorders of breast: Secondary | ICD-10-CM | POA: Insufficient documentation

## 2021-05-18 DIAGNOSIS — R921 Mammographic calcification found on diagnostic imaging of breast: Secondary | ICD-10-CM

## 2021-05-18 DIAGNOSIS — D0511 Intraductal carcinoma in situ of right breast: Secondary | ICD-10-CM | POA: Insufficient documentation

## 2021-05-18 DIAGNOSIS — N6032 Fibrosclerosis of left breast: Secondary | ICD-10-CM | POA: Diagnosis not present

## 2021-05-18 HISTORY — PX: BREAST BIOPSY: SHX20

## 2021-05-20 LAB — SURGICAL PATHOLOGY

## 2021-05-24 LAB — HM DIABETES EYE EXAM

## 2021-05-25 ENCOUNTER — Inpatient Hospital Stay (HOSPITAL_BASED_OUTPATIENT_CLINIC_OR_DEPARTMENT_OTHER): Payer: 59 | Admitting: Internal Medicine

## 2021-05-25 ENCOUNTER — Encounter: Payer: Self-pay | Admitting: Internal Medicine

## 2021-05-25 DIAGNOSIS — Z7984 Long term (current) use of oral hypoglycemic drugs: Secondary | ICD-10-CM | POA: Diagnosis not present

## 2021-05-25 DIAGNOSIS — F419 Anxiety disorder, unspecified: Secondary | ICD-10-CM | POA: Diagnosis not present

## 2021-05-25 DIAGNOSIS — R69 Illness, unspecified: Secondary | ICD-10-CM | POA: Diagnosis not present

## 2021-05-25 DIAGNOSIS — F17210 Nicotine dependence, cigarettes, uncomplicated: Secondary | ICD-10-CM | POA: Diagnosis not present

## 2021-05-25 DIAGNOSIS — E119 Type 2 diabetes mellitus without complications: Secondary | ICD-10-CM | POA: Diagnosis not present

## 2021-05-25 DIAGNOSIS — Z79899 Other long term (current) drug therapy: Secondary | ICD-10-CM | POA: Diagnosis not present

## 2021-05-25 DIAGNOSIS — D0511 Intraductal carcinoma in situ of right breast: Secondary | ICD-10-CM | POA: Diagnosis not present

## 2021-05-25 MED ORDER — NICOTINE 21 MG/24HR TD PT24: 21.0000 mg | MEDICATED_PATCH | Freq: Every day | TRANSDERMAL | 1 refills | Status: DC

## 2021-05-25 NOTE — Progress Notes (Signed)
[place] ?OFFICE PROGRESS NOTE ? ?Patient Care Team: ?Cletis Athens, MD as PCP - General (Internal Medicine) ?Theodore Demark, RN as Oncology Nurse Navigator ?Ronny Bacon, MD as Consulting Physician (General Surgery) ?Cammie Sickle, MD as Consulting Physician (Oncology) ? ? Cancer Staging  ?Ductal carcinoma in situ (DCIS) of right breast ?Staging form: Breast, AJCC 8th Edition ?- Clinical: cTis (DCIS) - Signed by Cammie Sickle, MD on 03/15/2021 ? ? ? ?Oncology History Overview Note  ?MAY 2022- [screening]-  ?# FEB 2023- 2. Stable probably benign calcifications in the upper central and upper outer right breast. ?  ?RECOMMENDATION: ?Stereotactic core needle biopsy of the right breast x 1.DIAGNOSIS:  ? ?FEB 28th, 2023-  ?A. 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  ?- DCIS IS PRESENT IN 5 OF 6 BLOCKS.  ? ?# MARCH 16th, 2023- DIAGNOSIS:  ?A. BREAST, RIGHT, UPPER OUTER QUADRANT; STEREOTACTIC-GUIDED CORE BIOPSY:  ?- DUCTAL CARCINOMA IN SITU (DCIS), INTERMEDIATE NUCLEAR GRADE WITHOUT  ?NECROSIS, SEE COMMENT.  ? ?MAY 10th.  2023 ? ?B. BREAST, RIGHT, UPPER OUTER QUADRANT POSTERIOR DEPTH (RIBBON CLIP);  ?STEREOTACTIC-GUIDED CORE BIOPSY:  ?- DUCTAL CARCINOMA IN SITU (DCIS), INTERMEDIATE NUCLEAR GRADE WITH FOCAL  ?NECROSIS, ASSOCIATED WITH CALCIFICATIONS.  ?- DCIS IS PRESENT IN 4 OF 6 BLOCKS, MEASURING UP TO 2 MM.  ? ?LEFT BREAST DUCTAL Hyperplasia ?  ?Ductal carcinoma in situ (DCIS) of right breast  ?03/15/2021 Initial Diagnosis  ? Ductal carcinoma in situ (DCIS) of right breast ?  ?03/15/2021 Cancer Staging  ? Staging form: Breast, AJCC 8th Edition ?- Clinical: cTis (DCIS) - Signed by Cammie Sickle, MD on 03/15/2021 ? ?  ? Genetic Testing  ? Negative genetic testing. No pathogenic variants identified on the [facility] CancerNext-Expanded+RNA panel. The report date is  05/12/2021. ? ?The CancerNext-Expanded + RNAinsight gene panel offered by [employer] and includes sequencing and rearrangement analysis for the following 77 genes: IP, ALK, APC*, ATM*, AXIN2, BAP1, BARD1, BLM, BMPR1A, BRCA1*, BRCA2*, BRIP1*, CDC73, CDH1*,CDK4, CDKN1B, CDKN2A, CHEK2*, CTNNA1, DICER1, FANCC, FH, FLCN, GALNT12, KIF1B, LZTR1, MAX, MEN1, MET, MLH1*, MSH2*, MSH3, MSH6*, MUTYH*, NBN, NF1*, NF2, NTHL1, PALB2*, PHOX2B, PMS2*, POT1, PRKAR1A, PTCH1, PTEN*, RAD51C*, RAD51D*,RB1, RECQL, RET, SDHA, SDHAF2, SDHB, SDHC, SDHD, SMAD4, SMARCA4, SMARCB1, SMARCE1, STK11, SUFU, TMEM127, TP53*,TSC1, TSC2, VHL and XRCC2 (sequencing and deletion/duplication); EGFR, EGLN1, HOXB13, KIT, MITF, PDGFRA, POLD1 and POLE (sequencing only); EPCAM and GREM1 (deletion/duplication only). ?  ? ? ?INTERVAL HISTORY: Obese.  Alone.  Ambulating independently. ? ?Heather Cain 51 y.o.  female with a recent diagnosis right breast DICS s/p Biopsy is here for a follow to discuss the results of the further breast biopsies.  ? ?Patient underwent Bilateral breast MRI- which was abnormal right more than left.  Patient underwent 2 subsequent biopsies on the right side; and 1 biopsy Of the left breast. ? ?Patient denies any significant discomfort from the biopsies.  ? ?Review of Systems  ?Constitutional:  Negative for chills, diaphoresis, fever, malaise/fatigue and weight loss.  ?HENT:  Negative for nosebleeds and sore throat.   ?Eyes:  Negative for double vision.  ?Respiratory:  Negative for cough, hemoptysis, sputum production, shortness of breath and wheezing.   ?Cardiovascular:  Negative for chest pain, palpitations, orthopnea and leg swelling.  ?Gastrointestinal:  Negative for abdominal pain, blood in stool, constipation, diarrhea, heartburn, melena, nausea and vomiting.  ?Genitourinary:  Negative for dysuria, frequency and urgency.  ?  Musculoskeletal:  Negative for back pain and joint pain.  ?Skin: Negative.  Negative for itching and rash.   ?Neurological:  Negative for dizziness, tingling, focal weakness, weakness and headaches.  ?Endo/Heme/Allergies:  Does not bruise/bleed easily.  ?Psychiatric/Behavioral:  Negative for depression. The patient is not nervous/anxious and does not have insomnia.   ?  ? ?PAST MEDICAL HISTORY :  ?Past Medical History:  ?Diagnosis Date  ? DCIS (ductal carcinoma in situ) of breast   ? Diabetes mellitus without complication ([place])   ? Family history of bladder cancer   ? Family history of uterine cancer   ? High cholesterol   ? Vaginal Pap smear, abnormal   ? ? ?PAST SURGICAL HISTORY :   ?Past Surgical History:  ?Procedure Laterality Date  ? BREAST BIOPSY Right 03/08/2021  ? right breast bx coil clip -positive  ? BREAST BIOPSY Right 03/24/2021  ? stereo biopsy x clip/DUCTAL CARCINOMA IN SITU (DCIS),  ? BREAST BIOPSY Left 05/18/2021  ? ribbon clip  ? BREAST BIOPSY Right 05/18/2021  ? coil clip  ? CRYOTHERAPY    ? WRIST SURGERY    ? ? ?FAMILY HISTORY :   ?Family History  ?Problem Relation Age of Onset  ? Uterine cancer Mother 59  ? Bladder Cancer Father 6  ? Skin cancer Maternal Aunt   ? Cancer - Other Maternal Grandfather   ?     possible colon cancer  ? Breast cancer Maternal Great-grandmother   ? ? ?SOCIAL HISTORY:   ?Social History  ? ?Tobacco Use  ? Smoking status: Every Day  ?  Packs/day: 1.00  ?  Types: Cigarettes  ? Smokeless tobacco: Never  ?Vaping Use  ? Vaping Use: Some days  ?Substance Use Topics  ? Alcohol use: Yes  ? Drug use: Not Currently  ? ? ?ALLERGIES:  is allergic to codeine. ? ?MEDICATIONS:  ?Current Outpatient Medications  ?Medication Sig Dispense Refill  ? Accu-Chek Softclix Lancets lancets USE 1  TO CHECK GLUCOSE ONCE DAILY 200 each 3  ? albuterol (VENTOLIN HFA) 108 (90 Base) MCG/ACT inhaler Inhale 2 puffs into the lungs every 6 (six) hours as needed for wheezing or shortness of breath. 8 g 0  ? Cobalamin Combinations (B-12) [phone] MCG SUBL B12    ? fluticasone (FLONASE) 50 MCG/ACT nasal spray Place  into both nostrils daily.    ? furosemide (LASIX) 20 MG tablet Take 1 tablet by mouth once daily 30 tablet 0  ? gabapentin (NEURONTIN) 100 MG capsule TAKE 1 CAPSULE BY MOUTH TWICE DAILY FOR 7 DAYS THEN INCREASE TO 2 CAPS TWICE DAILY AND CONTINUE    ? glimepiride (AMARYL) 2 MG tablet TAKE 1 TABLET BY MOUTH ONCE DAILY BEFORE BREAKFAST 30 tablet 0  ? glucose blood (ACCU-CHEK GUIDE) test strip USE 1 STRIP TO CHECK GLUCOSE ONCE DAILY 200 each 3  ? loratadine (CLARITIN) 10 MG tablet Take 10 mg by mouth daily.    ? nicotine (NICODERM CQ) 21 mg/24hr patch Place 1 patch (21 mg total) onto the skin daily. 28 patch 1  ? rosuvastatin (CRESTOR) 20 MG tablet Take 1 tablet by mouth once daily 30 tablet 0  ? ?No current facility-administered medications for this visit.  ? ? ?PHYSICAL EXAMINATION: ?ECOG PERFORMANCE STATUS: 0 - Asymptomatic ? ?BP 120/62 (BP Location: Left Arm, Patient Position: Sitting, Cuff Size: Large)   Pulse 89   Temp 99.1 ?F (37.3 ?C) (Tympanic)   Ht 5' 3"  (1.6 m)   Wt (!) 326 lb 12.8  oz (148.2 kg)   SpO2 97%   BMI 57.89 kg/m?  ? Danley Danker Weights  ? 05/25/21 1504  ?Weight: (!) 326 lb 12.8 oz (148.2 kg)  ? ? ?Physical Exam ?Vitals and nursing note reviewed.  ?HENT:  ?   Head: Normocephalic and atraumatic.  ?   Mouth/Throat:  ?   Pharynx: Oropharynx is clear.  ?Eyes:  ?   Extraocular Movements: Extraocular movements intact.  ?   Pupils: Pupils are equal, round, and reactive to light.  ?Cardiovascular:  ?   Rate and Rhythm: Normal rate and regular rhythm.  ?Pulmonary:  ?   Comments: Decreased breath sounds bilaterally.  ?Abdominal:  ?   Palpations: Abdomen is soft.  ?Musculoskeletal:     ?   General: Normal range of motion.  ?   Cervical back: Normal range of motion.  ?Skin: ?   General: Skin is warm.  ?Neurological:  ?   General: No focal deficit present.  ?   Mental Status: She is alert and oriented to person, place, and time.  ?Psychiatric:     ?   Behavior: Behavior normal.     ?   Judgment: Judgment  normal.  ? ? ? ? ? ?LABORATORY DATA:  ?I have reviewed the data as listed ?   ?Component Value Date/Time  ? NA 139 07/30/2020 2014  ? NA 141 05/05/2020 1527  ? K 3.7 07/30/2020 2014  ? CL 106 07/30/2020 2014  ? CO2 26 07/

## 2021-05-25 NOTE — Assessment & Plan Note (Addendum)
#   Right breast multifocal - DCIS high-grade; with comedonecrosis x3.  ER pending. ? ?#Left breast status postbiopsy-ductal fibrosis.  No concern for malignancy. ?  ?# I had a long discussion with patient regarding pathology/natural history of DCIS.  Also had a long discussion regarding the treatment options which include lumpectomy.  Also discussed regarding radiation therapy post lumpectomy.  ? ?# Given multifocal disease in the right breast-mastectomy is recommended.  Discussed that patient will not need postmastectomy radiation.  ?  ? # Also discussed option of possible antihormone therapy-like Tamoxifen to cut down the risk of development development of ipsilateral/contralateral invasive/noninvasive breast cancer. However, as patient is considering Bilateral mastectomies- she will not need any further endocrine therapy.   ?  ?# Genetics:  Negative for any deleterious mutation. ? ?# DM [hemoglobin A1c] around 6 as per patient.  Monitor closely.  ? ?# Active smoker: counseled to quit smoking. Sent a script for nicotine patch.  ? ?#Anxiety: Given the upcoming surgeries.  We will make a social work referral. ? ?DISPOSITION: ?# follow up in 6 weeks-; MD; no labs-Dr.B ? ?Cc; Dr.Rodenberg.  ?

## 2021-05-26 ENCOUNTER — Telehealth: Payer: Self-pay

## 2021-05-26 ENCOUNTER — Other Ambulatory Visit: Payer: Self-pay | Admitting: Internal Medicine

## 2021-05-26 ENCOUNTER — Other Ambulatory Visit: Payer: Self-pay

## 2021-05-26 DIAGNOSIS — D0511 Intraductal carcinoma in situ of right breast: Secondary | ICD-10-CM

## 2021-05-26 NOTE — Telephone Encounter (Addendum)
Research nurse called this patient to review the [employer] 3505-A protocol for blood biomarker's and tissue collection. Message left for patient to return call to the research department when convenient for her.  Jeral Fruit, RN 05/26/21 2:35 PM   Patient returned call to research. Research nurse briefly reviewed the [employer] 3505-A protocol with the patient. Patient states she wants to participate. Patient will come in on Tuesday March 23,2023 prior to her visit with Dr. Luther Bradley at 2:30 pm to review the consent and have her labs drawn.  Jeral Fruit, RN 05/26/21 4:32 PM

## 2021-05-27 ENCOUNTER — Encounter: Payer: Self-pay | Admitting: Licensed Clinical Social Worker

## 2021-05-27 NOTE — Progress Notes (Signed)
[place] Work  Clinical Social Work was referred by Art therapist for assessment of psychosocial needs.  Clinical Social Worker attempted to contact patient by phone  to offer support and assess for needs.  CSW left voicemail with contact information and request for return call.   First Attempt  Heather Cain, [place]

## 2021-05-31 ENCOUNTER — Ambulatory Visit (INDEPENDENT_AMBULATORY_CARE_PROVIDER_SITE_OTHER): Payer: 59 | Admitting: Surgery

## 2021-05-31 ENCOUNTER — Inpatient Hospital Stay: Payer: 59

## 2021-05-31 ENCOUNTER — Encounter: Payer: Self-pay | Admitting: Surgery

## 2021-05-31 VITALS — BP 135/85 | HR 93 | Temp 97.8°F | Ht 63.0 in | Wt 324.6 lb

## 2021-05-31 DIAGNOSIS — D0511 Intraductal carcinoma in situ of right breast: Secondary | ICD-10-CM

## 2021-05-31 NOTE — Progress Notes (Signed)
Patient ID: Heather Cain, female   DOB: 03-Aug-1970, 51 y.o.   MRN: [id number]  Chief Complaint: Multicentric DCIS right breast  History of Present Illness Biopsies of left sided lesion noted on MRI completed, no diagnosis of precancerous or cancerous lesion on the left.  However the patient desires to pursue prophylactic mastectomy on the left side. Previously: Heather Cain is a 51 y.o. female with the above diagnosis after 2 stereotactic biopsies were performed of right breast lesions in the upper and lower outer quadrants.  She presents today following an MRI which is still pending a completed report.  We have discovered this is due to a pending ultrasound of her left breast. These were calcifications that had been monitored to this point. She denies any palpable masses, nipple discharge, skin changes or breast pain.  She does not do monthly breast examinations.  But she does get her screening mammography on schedule. Her maternal great-grandmother is her only source of family history of breast cancer.  She began menstruating at the age of 75.  She was pregnant once with 1 miscarriage.  She was 51 years old at her first pregnancy.  Past Medical History Past Medical History:  Diagnosis Date   DCIS (ductal carcinoma in situ) of breast    Diabetes mellitus without complication (HCC)    Family history of bladder cancer    Family history of uterine cancer    High cholesterol    Vaginal Pap smear, abnormal       Past Surgical History:  Procedure Laterality Date   BREAST BIOPSY Right 03/08/2021   right breast bx coil clip -positive   BREAST BIOPSY Right 03/24/2021   stereo biopsy x clip/DUCTAL CARCINOMA IN SITU (DCIS),   BREAST BIOPSY Left 05/18/2021   ribbon clip   BREAST BIOPSY Right 05/18/2021   coil clip   CRYOTHERAPY     WRIST SURGERY      Allergies  Allergen Reactions   Codeine Itching and Anaphylaxis    Current Outpatient Medications  Medication Sig Dispense Refill    Accu-Chek Softclix Lancets lancets USE 1  TO CHECK GLUCOSE ONCE DAILY 200 each 3   albuterol (VENTOLIN HFA) 108 (90 Base) MCG/ACT inhaler Inhale 2 puffs into the lungs every 6 (six) hours as needed for wheezing or shortness of breath. 8 g 0   Cobalamin Combinations (B-12) [phone] MCG SUBL B12     fluticasone (FLONASE) 50 MCG/ACT nasal spray Place into both nostrils daily.     furosemide (LASIX) 20 MG tablet Take 1 tablet by mouth once daily 30 tablet 0   gabapentin (NEURONTIN) 100 MG capsule TAKE 1 CAPSULE BY MOUTH TWICE DAILY FOR 7 DAYS THEN INCREASE TO 2 CAPS TWICE DAILY AND CONTINUE     glimepiride (AMARYL) 2 MG tablet TAKE 1 TABLET BY MOUTH ONCE DAILY BEFORE BREAKFAST 30 tablet 0   glucose blood (ACCU-CHEK GUIDE) test strip USE 1 STRIP TO CHECK GLUCOSE ONCE DAILY 200 each 3   loratadine (CLARITIN) 10 MG tablet Take 10 mg by mouth daily.     nicotine (NICODERM CQ) 21 mg/24hr patch Place 1 patch (21 mg total) onto the skin daily. 28 patch 1   rosuvastatin (CRESTOR) 20 MG tablet Take 1 tablet by mouth once daily 30 tablet 0   No current facility-administered medications for this visit.    Family History Family History  Problem Relation Age of Onset   Uterine cancer Mother 35   Bladder Cancer Father 4   Skin  cancer Maternal Aunt    Cancer - Other Maternal Grandfather        possible colon cancer   Breast cancer Maternal Great-grandmother       Social History Social History   Tobacco Use   Smoking status: Every Day    Packs/day: 1.00    Types: Cigarettes   Smokeless tobacco: Never  Vaping Use   Vaping Use: Some days  Substance Use Topics   Alcohol use: Yes   Drug use: Not Currently     Review of Systems  Constitutional: Negative.   HENT: Negative.    Eyes: Negative.   Respiratory:  Positive for shortness of breath.   Cardiovascular: Negative.   Gastrointestinal: Negative.   Genitourinary: Negative.   Skin: Negative.   Neurological:  Positive for tingling  (Neuropathy).  Psychiatric/Behavioral: Negative.       Physical Exam Blood pressure 135/85, pulse 93, temperature 97.8 F (36.6 C), temperature source Oral, height '5\' 3"'$  (1.6 m), weight (!) 324 lb 9.6 oz (147.2 kg), SpO2 93 %. Last Weight  Most recent update: 05/31/2021  3:03 PM    Weight  147.2 kg (324 lb 9.6 oz)                CONSTITUTIONAL: Morbidly obese.  Well developed, and nourished, appropriately responsive and aware without distress.   EYES: Sclera non-icteric.   EARS, NOSE, MOUTH AND THROAT:  The oropharynx is clear. Oral mucosa is pink and moist.    Hearing is intact to voice.  NECK: Trachea is midline, and there is no jugular venous distension.  LYMPH NODES:  Lymph nodes in the neck are not appreciable. RESPIRATORY:  Lungs are clear, and breath sounds are decreased but equal bilaterally. Normal respiratory effort without pathologic use of accessory muscles. Breast exam.  Levada Dy present as chaperone.  No suspicious, nor dominant nodularity or masses in either breast.  No dermal changes, no ecchymosis, no appreciable mass. CARDIOVASCULAR: Heart is regular in rate and rhythm. GI: The abdomen is  soft, nontender, and nondistended.  MUSCULOSKELETAL:  Symmetrical muscle tone appreciated in all four extremities.    SKIN: Skin turgor is normal. No pathologic skin lesions appreciated.  NEUROLOGIC:  Motor and sensation appear grossly normal.  Cranial nerves are grossly without defect. PSYCH:  Alert and oriented to person, place and time. Affect is appropriate for situation.  Data Reviewed I have personally reviewed what is currently available of the patient's imaging, recent labs and medical records.   Labs:     Latest Ref Rng & Units 07/30/2020    8:14 PM 05/05/2020    3:27 PM 05/12/2015    6:26 AM  CBC  WBC 4.0 - 10.5 K/uL 9.2   7.8   8.3    Hemoglobin 12.0 - 15.0 g/dL 14.3   14.6   12.8    Hematocrit 36.0 - 46.0 % 43.2   42.4   36.9    Platelets 150 - 400 K/uL 232   239    205        Latest Ref Rng & Units 07/30/2020    8:14 PM 05/05/2020    3:27 PM 05/12/2015    6:26 AM  CMP  Glucose 70 - 99 mg/dL 119   138   104    BUN 6 - 20 mg/dL '17   11   11    '$ Creatinine 0.44 - 1.00 mg/dL 0.85   0.95   0.86    Sodium 135 - 145 mmol/L 139  141   140    Potassium 3.5 - 5.1 mmol/L 3.7   4.3   3.5    Chloride 98 - 111 mmol/L 106   102   107    CO2 22 - 32 mmol/L '26   21   26    '$ Calcium 8.9 - 10.3 mg/dL 9.1   8.8   8.5    Total Protein 6.0 - 8.5 g/dL  6.7     Total Bilirubin 0.0 - 1.2 mg/dL  0.2     Alkaline Phos 44 - 121 IU/L  109     AST 0 - 40 IU/L  10     ALT 0 - 32 IU/L  14      SURGICAL PATHOLOGY  CASE: [id number]  PATIENT: Patricia Pesa  Surgical Pathology Report   Specimen Submitted:  A. Breast, right upper outer quadrant; biopsy   Clinical History: Recent diagnosis of DCIS of right breast lower outer  quadrant (coil clip).  Similar-appearing calcifications right breast upper outer quadrant  biopsied today.  Post biopsy mammograms show appropriate positioning of the X shaped  biopsy marking clip at the  site of biopsy in the upper outer right breast.  No convincing residual calcifications.   DIAGNOSIS:  A. BREAST, RIGHT, UPPER OUTER QUADRANT; STEREOTACTIC-GUIDED CORE BIOPSY:  - DUCTAL CARCINOMA IN SITU (DCIS), INTERMEDIATE NUCLEAR GRADE WITHOUT  NECROSIS, SEE COMMENT.   Comment:  Additional deeper sections were reviewed.  No invasive carcinoma is identified.  There is focal DCIS arising in a background of flat epithelial atypia  and atypical ductal hyperplasia.  Calcifications are associated mostly with flat epithelial atypia.  Slides from the previous biopsy ([id number], 03/08/21) were reviewed.  In comparison with the previous, the current sample shows less extensive  involvement, lower nuclear grade, and absent necrosis.   SURGICAL PATHOLOGY  CASE: [phone]  PATIENT: Patricia Pesa  Surgical Pathology Report   Specimen  Submitted:  A. Breast, left, upper central; biopsy  B. Breast, right, upper outer posterior depth; biopsy   Clinical History: Left and right breast biopsies are performed:  1) Left breast, upper central, with asymmetry associated with  calcifications (ribbon clip). No previous left breast biopsy.  2) Right breast upper outer quadrant calcifications at posterior extent  of non-mass enhancement on MRI (ribbon clip).  History of recent RIGHT breast  biopsies: LOQ with high grade DCIS (coil  clip) and UOQ intermediate grade DCIS (X clip).   DIAGNOSIS:  A. BREAST, LEFT, UPPER CENTRAL (RIBBON CLIP); STEREOTACTIC-GUIDED CORE  BIOPSY:  - USUAL DUCTAL HYPERPLASIA AND FIBROSIS.  - NO DEFINITE ATYPIA IS IDENTIFIED.   Comment:  The degree of fibrosis could correlate with asymmetry.  There are small calcifications but there are none large enough to have  been seen on mammograms. Additional deeper sections were reviewed.   B. BREAST, RIGHT, UPPER OUTER QUADRANT POSTERIOR DEPTH (RIBBON CLIP);  STEREOTACTIC-GUIDED CORE BIOPSY:  - DUCTAL CARCINOMA IN SITU (DCIS), INTERMEDIATE NUCLEAR GRADE WITH FOCAL  NECROSIS, ASSOCIATED WITH CALCIFICATIONS.  - DCIS IS PRESENT IN 4 OF 6 BLOCKS, MEASURING UP TO 2 MM.   GROSS DESCRIPTION:  A. Labeled: Left breast biopsy asymmetry with calcs upper central  Received: in a formalin-filled [place] collection device  Specimen radiograph image(s) available for review  Time/Date in fixative: Collected at 1:06 PM on 05/18/2021 and placed in  formalin at 1:08 PM on 05/18/2021  Cold ischemic time: Less than 5 minutes  Total fixation time: Approximately 7 hours  Core pieces: Multiple  Measurement: Aggregate, 5.4 x 1.4 x 0.3 cm  Description / comments: Received are cores and fragments of yellow  fibrofatty tissue.  The accompanying diagram has sections E and F  checked.  Inked: Green  Entirely submitted in cassette(s):   1 - section E  2 - section F  3 - 5 - remaining  tissue fragments       3 - sections A and B       4 - sections C and D       5 - section G and remaining free-floating fragments   B. Labeled: Right breast stereo biopsy calcs upper outer quadrant  posterior depth  Received: in a formalin-filled [place] collection device  Specimen radiograph image(s) available for review  Time/Date in fixative: Collected at 1:26 PM on 05/18/2021 and placed in  formalin at 1:27 PM on 05/18/2021  Cold ischemic time: Less than 5 minutes  Total fixation time: Approximately 6.75 hours  Core pieces: Multiple  Measurement: Aggregate, 8 x 0.9 x 0.4 cm  Description / comments: Received are cores and fragments of yellow  fibrofatty tissue.  The accompanying diagram has sections A, B, C, and D  checked.  Inked: Black  Entirely submitted in cassette(s):   1 - section A  3 - section B  2 - section C  4 - section D  5 - 6 - remaining soft tissue fragments       5 - sections E and F       6 - section G and remaining free-floating fragments   RB 05/18/2021   Final Diagnosis performed by Bryan Lemma, MD.   Electronically signed  05/20/2021 4:34:31PM    Imaging: Radiology review:  CLINICAL DATA:  51 year old female presenting for six-month follow-up of probably benign right breast calcifications.   EXAM: DIGITAL DIAGNOSTIC UNILATERAL RIGHT MAMMOGRAM WITH TOMOSYNTHESIS AND CAD   TECHNIQUE: Right digital diagnostic mammography and breast tomosynthesis was performed. The images were evaluated with computer-aided detection.   COMPARISON:  Previous exam(s).   ACR Breast Density Category b: There are scattered areas of fibroglandular density.   FINDINGS: Spot 2D magnification views of the right breast were performed in addition to standard views. There is a small group of round and amorphous calcifications in the lower outer right breast that have increased in size compared to the prior study. These span overall 0.6 cm. The remaining groups of  calcifications in the upper central and upper outer right breast right breast are unchanged.   IMPRESSION: 1. Indeterminate small group of calcifications in the lower outer right breast.   2. Stable probably benign calcifications in the upper central and upper outer right breast.   RECOMMENDATION: Stereotactic core needle biopsy of the right breast x 1.   I have discussed the findings and recommendations with the patient. The patient will be contacted by our scheduler to arrange the biopsy appointment.   BI-RADS CATEGORY  4: Suspicious.     Electronically Signed   By: Audie Pinto M.D.   On: 02/17/2021 15:04 Within last 24 hrs: No results found.  CLINICAL DATA:  Patient with known RIGHT breast cancer. Patient had an MRI on 03/28/2021 showing linear clumped non-mass enhancement within the upper central anterior portion of the LEFT breast spanning 0.9 cm. Patient presented today for MRI guided biopsy. Due to body habitus, MRI biopsy can not be performed.   EXAM: BILATERAL BREAST MRI WITH AND WITHOUT CONTRAST   TECHNIQUE: Multiplanar, multisequence MR images of both  breasts were obtained prior to and following the intravenous administration of 10 ml of Gadavist   Three-dimensional MR images were rendered by post-processing of the original MR data on an independent workstation. The three-dimensional MR images were interpreted, and findings are reported in the following complete MRI report for this study. Three dimensional images were evaluated at the independent interpreting workstation using the DynaCAD thin client.   COMPARISON:  Previous exams including breast MRI dated 03/28/2021.   FINDINGS: Breast composition: b. Scattered fibroglandular tissue.   Background parenchymal enhancement: Mild   Right breast: The 2 sites of known RIGHT breast cancer (DCIS) are redemonstrated, with biopsy clip artifact, lower outer quadrant and upper-outer quadrant  respectively.   At the biopsy site within the upper-outer quadrant, there is enhancement at the biopsy site which measures 2.4 x 1.5 cm, likely a combination of known underlying DCIS and post biopsy change (series 9, image 68). However, there is now seen contiguous non-mass enhancement extending posterior to the biopsy site, from anterior to posterior depth, spanning 9.7 cm AP dimension, most posterior aspect measuring 1.5 cm transverse dimension (series 9, image 65), suspicious for additional disease extension.   At the biopsy site within the lower outer quadrant, there is focal non-mass enhancement within the outer RIGHT breast that measures 2.3 x 1.1 cm and is located 1.9 cm medial to the biopsy clip (series 9, image 108). This is likely the biopsy-proven DCIS, with slight lateral clip displacement.   No additional suspicious enhancing mass, suspicious non-mass enhancement or secondary signs of malignancy elsewhere within the RIGHT breast.   No additional suspicious enhancing mass, suspicious non-mass enhancement or secondary signs of malignancy are identified elsewhere within the RIGHT breast.   Left breast: Redemonstrated linear clumped non-mass enhancement within the upper central LEFT breast, at anterior depth, measuring 9 mm extent (series 9, image 84).   No suspicious enhancing mass, suspicious non-mass enhancement or secondary signs of malignancy are identified elsewhere within the LEFT breast.   Lymph nodes: No abnormal appearing lymph nodes.   Ancillary findings:  None.   IMPRESSION: 1. Patient presented today for MRI-guided biopsy for linear clumped non-mass enhancement within the upper central LEFT breast. MRI-guided biopsy can not be performed due to patient's body habitus. 2. This linear clumped non-mass enhancement is redemonstrated within the upper central LEFT breast, at anterior depth, measuring 9 mm extent. This remains a suspicious finding. 3.  Biopsy-proven DCIS within the upper-outer quadrant of the RIGHT breast. Enhancement at the biopsy site measures 2.4 x 1.5 cm, compatible with a combination of known DCIS and post biopsy change. HOWEVER, there is extensive contiguous non-mass enhancement extending posterior to the biopsy site, from anterior to posterior depth, spanning 9.7 cm AP dimension, SUSPICIOUS FOR ADDITIONAL EXTENSION OF DISEASE. Reviewing patient's previous mammograms, including mammogram dated 03/24/2021, there are indeterminate calcifications within the outer posterior RIGHT breast which likely correspond to this extent of non-mass enhancement. As such, recommend stereotactic biopsy for these most posterior calcifications in the outer RIGHT breast. 4. Biopsy-proven DCIS within the lower outer quadrant of the RIGHT breast, with associated biopsy site marker noted. Focal non-mass enhancement within the outer RIGHT breast, located 1.9 cm medial to the biopsy clip, measuring 2.3 x 1.1 cm. The enhancement likely represents the biopsy-proven DCIS with lateral displacement of the clip. If breast conservation surgery is performed, recommend adequate sampling medial to this coil shaped biopsy clip in the lower outer quadrant.   RECOMMENDATION: 1. Stereotactic biopsy for indeterminate calcifications within  the outer posterior RIGHT breast. These calcifications likely correspond to the most posterior extent of the contiguous non-mass enhancement seen on today's MRI which extends posterior to the biopsy site in the upper-outer quadrant of the RIGHT breast (X shaped clip) and is suspicious for additional extent of disease. 2. Patient was also recommended for LEFT breast stereotactic biopsy on diagnostic mammogram report of 04/05/2021 for a focal asymmetry with 2 associated punctate calcifications in the upper LEFT breast at middle depth. Per the earlier report, this asymmetry is unlikely to correspond to the linear  clumped non-mass enhancement seen within the upper central LEFT breast on MRI. 3. If breast conservation surgery is considered for the RIGHT breast, recommend adequate sampling MEDIAL to the COIL shaped biopsy clip in the lower outer quadrant as there is focal non-mass enhancement measuring 2.3 cm located 1.9 cm MEDIAL to the COIL shaped biopsy clip. 4. For the linear clumped non-mass enhancement within the upper central anterior LEFT breast, which could not be biopsied today with MRI guidance due to patient's body habitus, there are no other imaging workup considerations as a LEFT breast diagnostic mammogram and ultrasound failed to show a correlate. If breast conservation surgery is considered for the LEFT breast, could consider six-month follow-up MRI to ensure stability. However, this finding does remain suspicious for malignancy.   BI-RADS CATEGORY  4: Suspicious.   Electronically Signed: By: Franki Cabot M.D. 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  Left breast imaging remains suspicious at this time. Patient Active Problem List   Diagnosis Date Noted   Genetic testing 05/17/2021   Family history of uterine cancer 05/03/2021   Family history of bladder cancer 05/03/2021   Ductal carcinoma in situ (DCIS) of right breast 03/15/2021   Neuropathy 10/25/2020   Numbness and tingling 10/25/2020   Trigger index finger of right hand 10/25/2020   Atypical chest pain 07/30/2020   Sinusitis 05/18/2020   Class 3 severe obesity due to excess calories with serious comorbidity and body mass index (BMI) of 60.0 to 69.9 in adult [facility]) 05/18/2020   Diabetes ([place]) 05/12/2020   Elevated triglycerides with high cholesterol 05/12/2020   Abnormality of breast on screening mammography 05/12/2020    Plan    Per patient she reports that immediate breast reconstruction would not be entertained for various reasons. She desires to proceed with  right total mastectomy with sentinel lymph node biopsy, and prophylactic left total mastectomy. As there is potential for upstaging the best time to do a sentinel node will be at the same setting as her original mastectomy. We discussed the pros and cons of skin sparing and nipple sparing mastectomy and we will proceed with simple/total mastectomies.   I discussed risks of bleeding, infection, damage to surrounding tissues, having positive margins, needing additional resection, trauma to nerves causing arm numbness or difficulty raising arm, causing lymphoedema in the arm; as well as anesthesia risks of MI, stroke, prolonged ventilation, pulmonary embolism, thrombosis and even death.   Patient was given the opportunity to ask questions and have them answered.    She had several questions, and I believe they are all addressed adequately today.  Face-to-face time spent with the patient and accompanying care providers(if present) was 45 minutes, with more than 50% of the time spent counseling, educating, and coordinating care of the patient.    These notes generated with voice recognition software. I apologize for typographical errors.  Ronny Bacon M.D., FACS  05/31/2021, 4:31 PM

## 2021-05-31 NOTE — Research (Signed)
Trial: MTG-015 Tissue and Bodily Fluids  Patient Heather Cain was identified by Jeral Fruit, RN as a potential candidate for the above listed study.  This Clinical Research Nurse met with Heather Cain, [id number], on 05/31/21 in a manner and location that ensures patient privacy to discuss participation in the above listed research study.  Patient is Unaccompanied.  A copy of the informed consent document and separate HIPAA Authorization was provided to the patient.  Patient reads, speaks, and understands [place].    Patient was provided with the business card of this Nurse and encouraged to contact the research team with any questions.  Patient was provided the option of taking informed consent documents home to review and was encouraged to review at their convenience with their support network, including other care providers. Patient is comfortable with making a decision regarding study participation today.  As outlined in the informed consent form, this Nurse and [name] discussed the purpose of the research study, the investigational nature of the study, study procedures and requirements for study participation, potential risks and benefits of study participation, as well as alternatives to participation. This study is not blinded. The patient understands participation is voluntary and they may withdraw from study participation at any time.  This study does not involve randomization.  This study does not involve an investigational drug or device. This study does not involve a placebo. Patient understands enrollment is pending full eligibility review.   Confidentiality and how the patient's information will be used as part of study participation were discussed.  Patient was informed there is reimbursement provided for their time and effort spent on trial participation.  The patient is encouraged to discuss research study participation with their insurance provider to determine what  costs they may incur as part of study participation, including research related injury.    All questions were answered to patient's satisfaction.  The informed consent and separate HIPAA Authorization was reviewed page by page.  The patient's mental and emotional status is appropriate to provide informed consent, and the patient verbalizes an understanding of study participation.  Patient has agreed to participate in the above listed research study and has voluntarily signed the informed consent dated 11/29/2020 version 6 and separate HIPAA Authorization, version 5  on 05/31/21 at 226 PM.  The patient was provided with a copy of the signed informed consent form and separate HIPAA Authorization for their reference.  No study specific procedures were obtained prior to the signing of the informed consent document.  Approximately 20 minutes were spent with the patient reviewing the informed consent documents.  After obtaining informed consent patient, voluntarily signed the optional Release of Information form for use throughout trial participation.   Patient was provided with her $50 [employer] gift card after her blood collection was completed today with instructions for activation. The patient was thanked for her participation in clinical trials and encouraged to call if she has any questions.  Jeral Fruit, RN 05/31/21 3:11 PM

## 2021-05-31 NOTE — H&P (View-Only) (Signed)
Patient ID: Heather Cain, female   DOB: 23-Aug-1970, 51 y.o.   MRN: [id number]  Chief Complaint: Multicentric DCIS right breast  History of Present Illness Biopsies of left sided lesion noted on MRI completed, no diagnosis of precancerous or cancerous lesion on the left.  However the patient desires to pursue prophylactic mastectomy on the left side. Previously: Heather Cain is a 51 y.o. female with the above diagnosis after 2 stereotactic biopsies were performed of right breast lesions in the upper and lower outer quadrants.  She presents today following an MRI which is still pending a completed report.  We have discovered this is due to a pending ultrasound of her left breast. These were calcifications that had been monitored to this point. She denies any palpable masses, nipple discharge, skin changes or breast pain.  She does not do monthly breast examinations.  But she does get her screening mammography on schedule. Her maternal great-grandmother is her only source of family history of breast cancer.  She began menstruating at the age of 43.  She was pregnant once with 1 miscarriage.  She was 51 years old at her first pregnancy.  Past Medical History Past Medical History:  Diagnosis Date   DCIS (ductal carcinoma in situ) of breast    Diabetes mellitus without complication (HCC)    Family history of bladder cancer    Family history of uterine cancer    High cholesterol    Vaginal Pap smear, abnormal       Past Surgical History:  Procedure Laterality Date   BREAST BIOPSY Right 03/08/2021   right breast bx coil clip -positive   BREAST BIOPSY Right 03/24/2021   stereo biopsy x clip/DUCTAL CARCINOMA IN SITU (DCIS),   BREAST BIOPSY Left 05/18/2021   ribbon clip   BREAST BIOPSY Right 05/18/2021   coil clip   CRYOTHERAPY     WRIST SURGERY      Allergies  Allergen Reactions   Codeine Itching and Anaphylaxis    Current Outpatient Medications  Medication Sig Dispense Refill    Accu-Chek Softclix Lancets lancets USE 1  TO CHECK GLUCOSE ONCE DAILY 200 each 3   albuterol (VENTOLIN HFA) 108 (90 Base) MCG/ACT inhaler Inhale 2 puffs into the lungs every 6 (six) hours as needed for wheezing or shortness of breath. 8 g 0   Cobalamin Combinations (B-12) [phone] MCG SUBL B12     fluticasone (FLONASE) 50 MCG/ACT nasal spray Place into both nostrils daily.     furosemide (LASIX) 20 MG tablet Take 1 tablet by mouth once daily 30 tablet 0   gabapentin (NEURONTIN) 100 MG capsule TAKE 1 CAPSULE BY MOUTH TWICE DAILY FOR 7 DAYS THEN INCREASE TO 2 CAPS TWICE DAILY AND CONTINUE     glimepiride (AMARYL) 2 MG tablet TAKE 1 TABLET BY MOUTH ONCE DAILY BEFORE BREAKFAST 30 tablet 0   glucose blood (ACCU-CHEK GUIDE) test strip USE 1 STRIP TO CHECK GLUCOSE ONCE DAILY 200 each 3   loratadine (CLARITIN) 10 MG tablet Take 10 mg by mouth daily.     nicotine (NICODERM CQ) 21 mg/24hr patch Place 1 patch (21 mg total) onto the skin daily. 28 patch 1   rosuvastatin (CRESTOR) 20 MG tablet Take 1 tablet by mouth once daily 30 tablet 0   No current facility-administered medications for this visit.    Family History Family History  Problem Relation Age of Onset   Uterine cancer Mother 60   Bladder Cancer Father 52   Skin  cancer Maternal Aunt    Cancer - Other Maternal Grandfather        possible colon cancer   Breast cancer Maternal Great-grandmother       Social History Social History   Tobacco Use   Smoking status: Every Day    Packs/day: 1.00    Types: Cigarettes   Smokeless tobacco: Never  Vaping Use   Vaping Use: Some days  Substance Use Topics   Alcohol use: Yes   Drug use: Not Currently     Review of Systems  Constitutional: Negative.   HENT: Negative.    Eyes: Negative.   Respiratory:  Positive for shortness of breath.   Cardiovascular: Negative.   Gastrointestinal: Negative.   Genitourinary: Negative.   Skin: Negative.   Neurological:  Positive for tingling  (Neuropathy).  Psychiatric/Behavioral: Negative.       Physical Exam Blood pressure 135/85, pulse 93, temperature 97.8 F (36.6 C), temperature source Oral, height '5\' 3"'$  (1.6 m), weight (!) 324 lb 9.6 oz (147.2 kg), SpO2 93 %. Last Weight  Most recent update: 05/31/2021  3:03 PM    Weight  147.2 kg (324 lb 9.6 oz)                CONSTITUTIONAL: Morbidly obese.  Well developed, and nourished, appropriately responsive and aware without distress.   EYES: Sclera non-icteric.   EARS, NOSE, MOUTH AND THROAT:  The oropharynx is clear. Oral mucosa is pink and moist.    Hearing is intact to voice.  NECK: Trachea is midline, and there is no jugular venous distension.  LYMPH NODES:  Lymph nodes in the neck are not appreciable. RESPIRATORY:  Lungs are clear, and breath sounds are decreased but equal bilaterally. Normal respiratory effort without pathologic use of accessory muscles. Breast exam.  Levada Dy present as chaperone.  No suspicious, nor dominant nodularity or masses in either breast.  No dermal changes, no ecchymosis, no appreciable mass. CARDIOVASCULAR: Heart is regular in rate and rhythm. GI: The abdomen is  soft, nontender, and nondistended.  MUSCULOSKELETAL:  Symmetrical muscle tone appreciated in all four extremities.    SKIN: Skin turgor is normal. No pathologic skin lesions appreciated.  NEUROLOGIC:  Motor and sensation appear grossly normal.  Cranial nerves are grossly without defect. PSYCH:  Alert and oriented to person, place and time. Affect is appropriate for situation.  Data Reviewed I have personally reviewed what is currently available of the patient's imaging, recent labs and medical records.   Labs:     Latest Ref Rng & Units 07/30/2020    8:14 PM 05/05/2020    3:27 PM 05/12/2015    6:26 AM  CBC  WBC 4.0 - 10.5 K/uL 9.2   7.8   8.3    Hemoglobin 12.0 - 15.0 g/dL 14.3   14.6   12.8    Hematocrit 36.0 - 46.0 % 43.2   42.4   36.9    Platelets 150 - 400 K/uL 232   239    205        Latest Ref Rng & Units 07/30/2020    8:14 PM 05/05/2020    3:27 PM 05/12/2015    6:26 AM  CMP  Glucose 70 - 99 mg/dL 119   138   104    BUN 6 - 20 mg/dL '17   11   11    '$ Creatinine 0.44 - 1.00 mg/dL 0.85   0.95   0.86    Sodium 135 - 145 mmol/L 139  141   140    Potassium 3.5 - 5.1 mmol/L 3.7   4.3   3.5    Chloride 98 - 111 mmol/L 106   102   107    CO2 22 - 32 mmol/L '26   21   26    '$ Calcium 8.9 - 10.3 mg/dL 9.1   8.8   8.5    Total Protein 6.0 - 8.5 g/dL  6.7     Total Bilirubin 0.0 - 1.2 mg/dL  0.2     Alkaline Phos 44 - 121 IU/L  109     AST 0 - 40 IU/L  10     ALT 0 - 32 IU/L  14      SURGICAL PATHOLOGY  CASE: [id number]  PATIENT: Patricia Pesa  Surgical Pathology Report   Specimen Submitted:  A. Breast, right upper outer quadrant; biopsy   Clinical History: Recent diagnosis of DCIS of right breast lower outer  quadrant (coil clip).  Similar-appearing calcifications right breast upper outer quadrant  biopsied today.  Post biopsy mammograms show appropriate positioning of the X shaped  biopsy marking clip at the  site of biopsy in the upper outer right breast.  No convincing residual calcifications.   DIAGNOSIS:  A. BREAST, RIGHT, UPPER OUTER QUADRANT; STEREOTACTIC-GUIDED CORE BIOPSY:  - DUCTAL CARCINOMA IN SITU (DCIS), INTERMEDIATE NUCLEAR GRADE WITHOUT  NECROSIS, SEE COMMENT.   Comment:  Additional deeper sections were reviewed.  No invasive carcinoma is identified.  There is focal DCIS arising in a background of flat epithelial atypia  and atypical ductal hyperplasia.  Calcifications are associated mostly with flat epithelial atypia.  Slides from the previous biopsy ([id number], 03/08/21) were reviewed.  In comparison with the previous, the current sample shows less extensive  involvement, lower nuclear grade, and absent necrosis.   SURGICAL PATHOLOGY  CASE: [phone]  PATIENT: Patricia Pesa  Surgical Pathology Report   Specimen  Submitted:  A. Breast, left, upper central; biopsy  B. Breast, right, upper outer posterior depth; biopsy   Clinical History: Left and right breast biopsies are performed:  1) Left breast, upper central, with asymmetry associated with  calcifications (ribbon clip). No previous left breast biopsy.  2) Right breast upper outer quadrant calcifications at posterior extent  of non-mass enhancement on MRI (ribbon clip).  History of recent RIGHT breast  biopsies: LOQ with high grade DCIS (coil  clip) and UOQ intermediate grade DCIS (X clip).   DIAGNOSIS:  A. BREAST, LEFT, UPPER CENTRAL (RIBBON CLIP); STEREOTACTIC-GUIDED CORE  BIOPSY:  - USUAL DUCTAL HYPERPLASIA AND FIBROSIS.  - NO DEFINITE ATYPIA IS IDENTIFIED.   Comment:  The degree of fibrosis could correlate with asymmetry.  There are small calcifications but there are none large enough to have  been seen on mammograms. Additional deeper sections were reviewed.   B. BREAST, RIGHT, UPPER OUTER QUADRANT POSTERIOR DEPTH (RIBBON CLIP);  STEREOTACTIC-GUIDED CORE BIOPSY:  - DUCTAL CARCINOMA IN SITU (DCIS), INTERMEDIATE NUCLEAR GRADE WITH FOCAL  NECROSIS, ASSOCIATED WITH CALCIFICATIONS.  - DCIS IS PRESENT IN 4 OF 6 BLOCKS, MEASURING UP TO 2 MM.   GROSS DESCRIPTION:  A. Labeled: Left breast biopsy asymmetry with calcs upper central  Received: in a formalin-filled [place] collection device  Specimen radiograph image(s) available for review  Time/Date in fixative: Collected at 1:06 PM on 05/18/2021 and placed in  formalin at 1:08 PM on 05/18/2021  Cold ischemic time: Less than 5 minutes  Total fixation time: Approximately 7 hours  Core pieces: Multiple  Measurement: Aggregate, 5.4 x 1.4 x 0.3 cm  Description / comments: Received are cores and fragments of yellow  fibrofatty tissue.  The accompanying diagram has sections E and F  checked.  Inked: Green  Entirely submitted in cassette(s):   1 - section E  2 - section F  3 - 5 - remaining  tissue fragments       3 - sections A and B       4 - sections C and D       5 - section G and remaining free-floating fragments   B. Labeled: Right breast stereo biopsy calcs upper outer quadrant  posterior depth  Received: in a formalin-filled [place] collection device  Specimen radiograph image(s) available for review  Time/Date in fixative: Collected at 1:26 PM on 05/18/2021 and placed in  formalin at 1:27 PM on 05/18/2021  Cold ischemic time: Less than 5 minutes  Total fixation time: Approximately 6.75 hours  Core pieces: Multiple  Measurement: Aggregate, 8 x 0.9 x 0.4 cm  Description / comments: Received are cores and fragments of yellow  fibrofatty tissue.  The accompanying diagram has sections A, B, C, and D  checked.  Inked: Black  Entirely submitted in cassette(s):   1 - section A  3 - section B  2 - section C  4 - section D  5 - 6 - remaining soft tissue fragments       5 - sections E and F       6 - section G and remaining free-floating fragments   RB 05/18/2021   Final Diagnosis performed by Bryan Lemma, MD.   Electronically signed  05/20/2021 4:34:31PM    Imaging: Radiology review:  CLINICAL DATA:  51 year old female presenting for six-month follow-up of probably benign right breast calcifications.   EXAM: DIGITAL DIAGNOSTIC UNILATERAL RIGHT MAMMOGRAM WITH TOMOSYNTHESIS AND CAD   TECHNIQUE: Right digital diagnostic mammography and breast tomosynthesis was performed. The images were evaluated with computer-aided detection.   COMPARISON:  Previous exam(s).   ACR Breast Density Category b: There are scattered areas of fibroglandular density.   FINDINGS: Spot 2D magnification views of the right breast were performed in addition to standard views. There is a small group of round and amorphous calcifications in the lower outer right breast that have increased in size compared to the prior study. These span overall 0.6 cm. The remaining groups of  calcifications in the upper central and upper outer right breast right breast are unchanged.   IMPRESSION: 1. Indeterminate small group of calcifications in the lower outer right breast.   2. Stable probably benign calcifications in the upper central and upper outer right breast.   RECOMMENDATION: Stereotactic core needle biopsy of the right breast x 1.   I have discussed the findings and recommendations with the patient. The patient will be contacted by our scheduler to arrange the biopsy appointment.   BI-RADS CATEGORY  4: Suspicious.     Electronically Signed   By: Audie Pinto M.D.   On: 02/17/2021 15:04 Within last 24 hrs: No results found.  CLINICAL DATA:  Patient with known RIGHT breast cancer. Patient had an MRI on 03/28/2021 showing linear clumped non-mass enhancement within the upper central anterior portion of the LEFT breast spanning 0.9 cm. Patient presented today for MRI guided biopsy. Due to body habitus, MRI biopsy can not be performed.   EXAM: BILATERAL BREAST MRI WITH AND WITHOUT CONTRAST   TECHNIQUE: Multiplanar, multisequence MR images of both  breasts were obtained prior to and following the intravenous administration of 10 ml of Gadavist   Three-dimensional MR images were rendered by post-processing of the original MR data on an independent workstation. The three-dimensional MR images were interpreted, and findings are reported in the following complete MRI report for this study. Three dimensional images were evaluated at the independent interpreting workstation using the DynaCAD thin client.   COMPARISON:  Previous exams including breast MRI dated 03/28/2021.   FINDINGS: Breast composition: b. Scattered fibroglandular tissue.   Background parenchymal enhancement: Mild   Right breast: The 2 sites of known RIGHT breast cancer (DCIS) are redemonstrated, with biopsy clip artifact, lower outer quadrant and upper-outer quadrant  respectively.   At the biopsy site within the upper-outer quadrant, there is enhancement at the biopsy site which measures 2.4 x 1.5 cm, likely a combination of known underlying DCIS and post biopsy change (series 9, image 68). However, there is now seen contiguous non-mass enhancement extending posterior to the biopsy site, from anterior to posterior depth, spanning 9.7 cm AP dimension, most posterior aspect measuring 1.5 cm transverse dimension (series 9, image 65), suspicious for additional disease extension.   At the biopsy site within the lower outer quadrant, there is focal non-mass enhancement within the outer RIGHT breast that measures 2.3 x 1.1 cm and is located 1.9 cm medial to the biopsy clip (series 9, image 108). This is likely the biopsy-proven DCIS, with slight lateral clip displacement.   No additional suspicious enhancing mass, suspicious non-mass enhancement or secondary signs of malignancy elsewhere within the RIGHT breast.   No additional suspicious enhancing mass, suspicious non-mass enhancement or secondary signs of malignancy are identified elsewhere within the RIGHT breast.   Left breast: Redemonstrated linear clumped non-mass enhancement within the upper central LEFT breast, at anterior depth, measuring 9 mm extent (series 9, image 84).   No suspicious enhancing mass, suspicious non-mass enhancement or secondary signs of malignancy are identified elsewhere within the LEFT breast.   Lymph nodes: No abnormal appearing lymph nodes.   Ancillary findings:  None.   IMPRESSION: 1. Patient presented today for MRI-guided biopsy for linear clumped non-mass enhancement within the upper central LEFT breast. MRI-guided biopsy can not be performed due to patient's body habitus. 2. This linear clumped non-mass enhancement is redemonstrated within the upper central LEFT breast, at anterior depth, measuring 9 mm extent. This remains a suspicious finding. 3.  Biopsy-proven DCIS within the upper-outer quadrant of the RIGHT breast. Enhancement at the biopsy site measures 2.4 x 1.5 cm, compatible with a combination of known DCIS and post biopsy change. HOWEVER, there is extensive contiguous non-mass enhancement extending posterior to the biopsy site, from anterior to posterior depth, spanning 9.7 cm AP dimension, SUSPICIOUS FOR ADDITIONAL EXTENSION OF DISEASE. Reviewing patient's previous mammograms, including mammogram dated 03/24/2021, there are indeterminate calcifications within the outer posterior RIGHT breast which likely correspond to this extent of non-mass enhancement. As such, recommend stereotactic biopsy for these most posterior calcifications in the outer RIGHT breast. 4. Biopsy-proven DCIS within the lower outer quadrant of the RIGHT breast, with associated biopsy site marker noted. Focal non-mass enhancement within the outer RIGHT breast, located 1.9 cm medial to the biopsy clip, measuring 2.3 x 1.1 cm. The enhancement likely represents the biopsy-proven DCIS with lateral displacement of the clip. If breast conservation surgery is performed, recommend adequate sampling medial to this coil shaped biopsy clip in the lower outer quadrant.   RECOMMENDATION: 1. Stereotactic biopsy for indeterminate calcifications within  the outer posterior RIGHT breast. These calcifications likely correspond to the most posterior extent of the contiguous non-mass enhancement seen on today's MRI which extends posterior to the biopsy site in the upper-outer quadrant of the RIGHT breast (X shaped clip) and is suspicious for additional extent of disease. 2. Patient was also recommended for LEFT breast stereotactic biopsy on diagnostic mammogram report of 04/05/2021 for a focal asymmetry with 2 associated punctate calcifications in the upper LEFT breast at middle depth. Per the earlier report, this asymmetry is unlikely to correspond to the linear  clumped non-mass enhancement seen within the upper central LEFT breast on MRI. 3. If breast conservation surgery is considered for the RIGHT breast, recommend adequate sampling MEDIAL to the COIL shaped biopsy clip in the lower outer quadrant as there is focal non-mass enhancement measuring 2.3 cm located 1.9 cm MEDIAL to the COIL shaped biopsy clip. 4. For the linear clumped non-mass enhancement within the upper central anterior LEFT breast, which could not be biopsied today with MRI guidance due to patient's body habitus, there are no other imaging workup considerations as a LEFT breast diagnostic mammogram and ultrasound failed to show a correlate. If breast conservation surgery is considered for the LEFT breast, could consider six-month follow-up MRI to ensure stability. However, this finding does remain suspicious for malignancy.   BI-RADS CATEGORY  4: Suspicious.   Electronically Signed: By: Franki Cabot M.D. 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  Left breast imaging remains suspicious at this time. Patient Active Problem List   Diagnosis Date Noted   Genetic testing 05/17/2021   Family history of uterine cancer 05/03/2021   Family history of bladder cancer 05/03/2021   Ductal carcinoma in situ (DCIS) of right breast 03/15/2021   Neuropathy 10/25/2020   Numbness and tingling 10/25/2020   Trigger index finger of right hand 10/25/2020   Atypical chest pain 07/30/2020   Sinusitis 05/18/2020   Class 3 severe obesity due to excess calories with serious comorbidity and body mass index (BMI) of 60.0 to 69.9 in adult [facility]) 05/18/2020   Diabetes ([place]) 05/12/2020   Elevated triglycerides with high cholesterol 05/12/2020   Abnormality of breast on screening mammography 05/12/2020    Plan    Per patient she reports that immediate breast reconstruction would not be entertained for various reasons. She desires to proceed with  right total mastectomy with sentinel lymph node biopsy, and prophylactic left total mastectomy. As there is potential for upstaging the best time to do a sentinel node will be at the same setting as her original mastectomy. We discussed the pros and cons of skin sparing and nipple sparing mastectomy and we will proceed with simple/total mastectomies.   I discussed risks of bleeding, infection, damage to surrounding tissues, having positive margins, needing additional resection, trauma to nerves causing arm numbness or difficulty raising arm, causing lymphoedema in the arm; as well as anesthesia risks of MI, stroke, prolonged ventilation, pulmonary embolism, thrombosis and even death.   Patient was given the opportunity to ask questions and have them answered.    She had several questions, and I believe they are all addressed adequately today.  Face-to-face time spent with the patient and accompanying care providers(if present) was 45 minutes, with more than 50% of the time spent counseling, educating, and coordinating care of the patient.    These notes generated with voice recognition software. I apologize for typographical errors.  Ronny Bacon M.D., FACS  05/31/2021, 4:31 PM

## 2021-05-31 NOTE — Patient Instructions (Addendum)
Our surgery scheduler Pamala Hurry will call you within 24-48 hours to get you scheduled. If you have not heard from her after 48 hours, please call our office. Have the blue sheet available when she calls to write down important information.   If you have any concerns or questions, please feel free to call our office.   Total or Modified Radical Mastectomy A total mastectomy and a modified radical mastectomy are surgeries that are done as part of treatment for breast cancer. Both types involve removing a breast. In a total mastectomy (simple mastectomy), all breast tissue including the nipple will be removed. In a modified radical mastectomy, lymph nodes under the arm will be removed along with the breast and nipple. Some of the lining over the muscle tissues under the breast may also be removed. These procedures may also be used to help prevent breast cancer. A preventive (prophylactic) mastectomy may be done if you are at an increased risk of breast cancer due to harmful changes (mutations) in certain genes, such as the BRCA genes. In that case, the procedure involves removing both of your breasts. This can reduce your risk of developing breast cancer in the future. For a transgender person, a total mastectomy may be done as part of a surgical transition from female to female. Let your health care provider know about: Any allergies you have. All medicines you are taking, including vitamins, herbs, eye drops, creams, and over-the-counter medicines. Any problems you or family members have had with anesthetic medicines. Any bleeding problems you have. Any surgeries you have had. Any medical conditions you have. Whether you are pregnant or may be pregnant. What are the risks? Generally, this is a safe procedure. However, problems may occur, including: Infection. Bleeding. Allergic reactions to medicines. Scar tissue. Chest numbness, sensation of throbbing, or tingling on the side of the  surgery. Fluid buildup under the skin flaps where your breast was removed (seroma). Stress or sadness from losing your breast. If you have the lymph nodes under your arm removed, you may have arm swelling, weakness, or numbness on the same side of your body as your surgery. What happens before the procedure? When to stop eating and drinking Follow instructions from your health care provider about what you may eat and drink before your procedure. These may include: 8 hours before your procedure Stop eating most foods. Do not eat meat, fried foods, or fatty foods. Eat only light foods, such as toast or crackers. All liquids are okay except energy drinks and alcohol. 6 hours before your procedure Stop eating. Drink only clear liquids, such as water, clear fruit juice, black coffee, plain tea, and sports drinks. Do not drink energy drinks or alcohol. 2 hours before your procedure Stop drinking all liquids. You may be allowed to take medicines with small sips of water. If you do not follow your health care provider's instructions, your procedure may be delayed or canceled. Medicines Ask your health care provider about: Changing or stopping your regular medicines. This is especially important if you are taking diabetes medicines or blood thinners. Taking medicines such as aspirin and ibuprofen. These medicines can thin your blood. Do not take these medicines unless your health care provider tells you to take them. Taking over-the-counter medicines, vitamins, herbs, and supplements. General instructions You may be checked for extra fluid around your lymph nodes (lymphedema). Do not use any products that contain nicotine or tobacco before the procedure. These products include cigarettes, chewing tobacco, and vaping devices, such  as e-cigarettes. If you need help quitting, ask your health care provider. Ask your health care provider about: How your surgery site will be marked. What steps will be  taken to help prevent infection. These steps may include: Removing hair at the surgery site. Washing skin with a germ-killing soap. Taking antibiotic medicine. What happens during the procedure? An IV will be inserted into one of your veins. You will be given: A medicine to help you relax (sedative). A medicine to make you fall asleep (general anesthetic). A wide incision will be made around your nipple. The skin of the breast and the nipple inside the incision will be removed along with all breast tissue. Lymph nodes under the arm on the side of the tumor will be checked to see if the cancer has spread. If you are having a modified radical mastectomy: The lining over your chest muscles will be removed. The incision may be extended to reach the lymph nodes under your arm, or a second incision may be made. Lymph nodes will be removed. Breast tissue and lymph nodes that are removed will be sent to the lab for testing. You may have a drainage tube inserted into your incision to collect fluid that builds up after surgery. This tube will be connected to a suction bulb on the outside of your body to remove the fluid. Your incision or incisions will be closed with stitches (sutures), skin glue, or adhesive strips. A bandage (dressing) will be placed over your breast area. If lymph nodes were removed, a dressing will also be placed under your arm. The procedure may vary among health care providers and hospitals. What happens after the procedure? Your blood pressure, heart rate, breathing rate, and blood oxygen level will be monitored until you leave the hospital or clinic. You will be given pain medicine as needed. Your IV can be removed when you are able to eat and drink. You may have a drainage tube in place for 2-3 days to prevent a collection of blood (hematoma) from developing in the breast area. You will be given instructions about caring for the drain before you go home. A pressure bandage may  be applied for 1-2 days to prevent bleeding or swelling. Ask your health care provider how to care for your pressure bandage at home. Summary In a total mastectomy (simple mastectomy), all breast tissue including the nipple will be removed. In a modified radical mastectomy, lymph nodes under the arm will be removed along with the breast and nipple, and the chest wall lining. Before the procedure, follow instructions from your health care provider about eating and drinking, and ask about changing or stopping your regular medicines. You may have a drainage tube inserted into your incision to collect fluid that builds up after surgery. This tube will be connected to a suction bulb on the outside of your body to remove the fluid. This information is not intended to replace advice given to you by your health care provider. Make sure you discuss any questions you have with your health care provider. Document Revised: 09/26/2020 Document Reviewed: 09/26/2020 Elsevier Patient Education  [place].

## 2021-05-31 NOTE — Research (Signed)
MTG-015 - Tissue and Bodily Fluids: Translational Medicine: Discovery and Evaluation of Biomarkers/Pharmacogenomics for the Diagnosis and Personalized Management of Patients   05/31/21  This Coordinator has reviewed this patient's inclusion and exclusion criteria and confirmed Heather Cain is eligible for study participation.  Patient will continue with enrollment.  Clabe Seal Clinical Research Coordinator I  05/31/21 3:18 PM

## 2021-06-01 ENCOUNTER — Other Ambulatory Visit: Payer: Self-pay

## 2021-06-01 ENCOUNTER — Telehealth: Payer: Self-pay | Admitting: Surgery

## 2021-06-01 ENCOUNTER — Encounter: Payer: Self-pay | Admitting: Licensed Clinical Social Worker

## 2021-06-01 ENCOUNTER — Telehealth: Payer: Self-pay | Admitting: Internal Medicine

## 2021-06-01 DIAGNOSIS — D0511 Intraductal carcinoma in situ of right breast: Secondary | ICD-10-CM

## 2021-06-01 NOTE — Telephone Encounter (Signed)
Left message for patient to call, please inform her of the following regarding scheduled surgery:   Pre-Admission date/time, COVID Testing date and Surgery date.  Surgery Date: 06/15/21 will need to arrive at 11:30 am since having SLN bx inection done first prior to surgery, arrive at [facility].      Preadmission Testing Date: 06/08/21 (phone 8a-1p)  Covid Testing Date: Not needed.

## 2021-06-01 NOTE — Telephone Encounter (Signed)
Patient's surgery has been moved up to 6/7 and she wants to see if we would like her to be seen earlier than her previously scheduled appt on 6/28?  Please advise.

## 2021-06-01 NOTE — Telephone Encounter (Signed)
Patient calls back, she is now informed of all dates regarding her surgery and verbalized understanding.   

## 2021-06-01 NOTE — Progress Notes (Unsigned)
[place] Work  Initial Assessment   Heather Cain is a 51 y.o. year old female contacted by phone. Clinical Social Work was referred by medical provider for assessment of psychosocial needs.   SDOH (Social Determinants of Health) assessments performed: Yes SDOH Interventions    Flowsheet Row Most Recent Value  SDOH Interventions   Food Insecurity Interventions Intervention Not Indicated  Financial Strain Interventions Intervention Not Indicated  Housing Interventions Intervention Not Indicated  Physical Activity Interventions Intervention Not Indicated  Stress Interventions Provide Counseling  Social Connections Interventions Other (Comment)  [Patient has strong support systems]  Transportation Interventions Intervention Not Indicated  Depression Interventions/Treatment  Counseling       SDOH Screenings   Alcohol Screen: Low Risk    Last Alcohol Screening Score (AUDIT): 2  Depression (PHQ2-9): Low Risk    PHQ-2 Score: 1  Financial Resource Strain: Low Risk    Difficulty of Paying Living Expenses: Not hard at all  Food Insecurity: No Food Insecurity   Worried About Charity fundraiser in the Last Year: Never true   Ran Out of Food in the Last Year: Never true  Housing: Low Risk    Last Housing Risk Score: 0  Physical Activity: Inactive   Days of Exercise per Week: 0 days   Minutes of Exercise per Session: 0 min  Social Connections: Socially Isolated   Frequency of Communication with Friends and Family: More than three times a week   Frequency of Social Gatherings with Friends and Family: More than three times a week   Attends Religious Services: Never   Marine scientist or Organizations: No   Attends Music therapist: Never   Marital Status: Never married  Stress: Stress Concern Present   Feeling of Stress : Rather much  Tobacco Use: High Risk   Smoking Tobacco Use: Every Day   Smokeless Tobacco Use: Never   Passive Exposure: Not on  file  Transportation Needs: No Transportation Needs   Lack of Transportation (Medical): No   Lack of Transportation (Non-Medical): No     Distress Screen completed: No    03/21/2021    2:00 PM  ONCBCN DISTRESS SCREENING  Screening Type Initial Screening  Distress experienced in past week (1-10) 0      Family/Social Information:  Housing Arrangement: patient lives [place] support system [name] (Father)  [phone] and [name] [phone] Family members/support persons in your life? 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  Income source: Employment Financial concerns: Yes, due to illness and/or loss of work during treatment Type of concern: Utilities, Film/video editor, Phone, Transportation, and Medical bills Food access concerns: no Religious or spiritual practice: Not known Services Currently in place:  [employer]  Coping/ Adjustment to diagnosis: Patient understands treatment plan and what happens next? yes Concerns about diagnosis and/or treatment: Pain or discomfort during procedures, Losing my job and/or losing income, Overwhelmed by information, Afraid of cancer, How I will pay for the services I need, and How will I care for myself Patient reported stressors: Work/ school, Finances, Depression, Anxiety/ nervousness, and Adjusting to my illness Hopes and/or priorities: the surgery will go well Patient enjoys  N/A Current coping skills/ strengths: Ability for insight , Active sense of humor , Capable of independent living , Communication skills , General fund of knowledge , Motivation for treatment/growth , Supportive family/friends ,  and Work skills     SUMMARY: Current SDOH Barriers:  Financial constraints related to loss of income from stopping work due to surgery and treatment and Level of care  concerns  Clinical Social Work Clinical Goal(s):  Patient will work with SW to address concerns related to anxiety and adjustment to treatment and diagnosis.  Interventions: Discussed common feeling and emotions when being diagnosed with cancer, and the importance of support during treatment Informed patient of the support team roles and support services at [facility] Provided [place] contact information and encouraged patient to call with any questions or concerns Referred patient to grant fund manager, [employer] for disability and Provided patient with information about CSW role in patient care and available resources.  Patient scheduled appointment with CSW for Thursday 04/02/2021 at 2:15.  CSW will also email patient information on financial assistance from outside agencies to [email or web address]    Follow Up Plan: CSW will see patient on 04/02/2021 Patient verbalizes understanding of plan: Yes    Adelene Amas, LCSW   Patient is participating in a Managed Medicaid Plan:  {MM YES/NO:27447::"Yes"}

## 2021-06-02 ENCOUNTER — Ambulatory Visit: Payer: Self-pay | Admitting: Surgery

## 2021-06-02 ENCOUNTER — Inpatient Hospital Stay: Payer: 59 | Admitting: Licensed Clinical Social Worker

## 2021-06-02 ENCOUNTER — Other Ambulatory Visit: Payer: Self-pay | Admitting: Surgery

## 2021-06-02 DIAGNOSIS — D0511 Intraductal carcinoma in situ of right breast: Secondary | ICD-10-CM

## 2021-06-03 NOTE — Progress Notes (Signed)
[place] CSW Progress Note  Holiday representative met with patient to discuss adjustment to diagnosis and treatment. Patient reported she was doing well, but became emotional when CSW asked her how it was gong?  Patient stated she has "held it together" but she is having difficulty coming to terms that she will have a double mastectomy.  Patient stated, "I know I chose the double mastectomy but I don't know how I am going to feel about how I look afterwards." CSW and patient discussed different ways to manage feelings of fear and anxiety and CSW recommended reading material which may assist with preparing for a mastectomy. Patient and CSW discussed conversations she could have with her parents and sister, who are a very active support system. Patient also has a healthy support system with her friends.  Patient and her parents dicussed financial aspects of her surgery and recovery. Currently the patient will not need chemotherapy or radiation after surgery.  CSW and patient discussed creating boundaries and possible emotional impacts post-surgery. CSW will send patient information on financial assistance programs and will send information to [facility] for disability assistance. Patient email [email or web address] .Georgette Dover Davionna Blacksher, LCSW

## 2021-06-07 NOTE — Progress Notes (Signed)
Worked with patient to coordinate request for Advance Directive forms/information.  She will pick up forms in [place] on  06/09/21 prior to meeting with Teresita Maxey LCSW.

## 2021-06-08 ENCOUNTER — Other Ambulatory Visit: Payer: Self-pay

## 2021-06-08 ENCOUNTER — Encounter
Admission: RE | Admit: 2021-06-08 | Discharge: 2021-06-08 | Disposition: A | Discharge status: Home / Self Care | Payer: 59 | Source: Ambulatory Visit | Attending: Surgery | Admitting: Surgery

## 2021-06-08 VITALS — Ht 63.0 in | Wt 325.0 lb

## 2021-06-08 DIAGNOSIS — E119 Type 2 diabetes mellitus without complications: Secondary | ICD-10-CM

## 2021-06-08 DIAGNOSIS — Z01818 Encounter for other preprocedural examination: Secondary | ICD-10-CM

## 2021-06-08 HISTORY — DX: Nausea with vomiting, unspecified: R11.2

## 2021-06-08 HISTORY — DX: Myoneural disorder, unspecified: G70.9

## 2021-06-08 HISTORY — DX: Other specified postprocedural states: Z98.890

## 2021-06-08 NOTE — Patient Instructions (Addendum)
Your procedure is scheduled on: Wednesday 06/15/21 Report to the Registration Desk on the 1st floor of the [facility]. To find out your arrival time, please call [phone] between 1PM - 3PM on: Tuesday 06/14/21 If your arrival time is 6:00 am, do not arrive prior to that time as the [place] entrance doors do not open until 6:00 am.  REMEMBER: Instructions that are not followed completely may result in serious medical risk, up to and including death; or upon the discretion of your surgeon and anesthesiologist your surgery may need to be rescheduled.  Do not eat or drink after midnight the night before surgery.  No gum chewing, lozengers or hard candies.  TAKE THESE MEDICATIONS THE MORNING OF SURGERY WITH A SIP OF WATER: gabapentin (NEURONTIN) 100 MG capsule- pt prefers not to take  Use your albuterol (VENTOLIN HFA) 108 (90 Base) MCG/ACT inhaler on the day of surgery and bring to the hospital.  One week prior to surgery: Stop Anti-inflammatories (NSAIDS) such as Advil, Aleve, Ibuprofen, Motrin, Naproxen, Naprosyn and Aspirin based products such as Excedrin, Goodys Powder, BC Powder.  Stop taking your Cobalamin Combinations (B-12) [phone] MCG SUBL and ANY OVER THE COUNTER supplements until after surgery.  You may however, continue to take Tylenol if needed for pain up until the day of surgery.  No Alcohol for 24 hours before or after surgery.  No Smoking including e-cigarettes for 24 hours prior to surgery.  No chewable tobacco products for at least 6 hours prior to surgery.  No nicotine patches on the day of surgery.  Do not use any "recreational" drugs for at least a week prior to your surgery.  Please be advised that the combination of cocaine and anesthesia may have negative outcomes, up to and including death. If you test positive for cocaine, your surgery will be cancelled.  On the morning of surgery brush your teeth with toothpaste and water, you may rinse your mouth  with mouthwash if you wish. Do not swallow any toothpaste or mouthwash.  Use CHG wipes as directed on instruction sheet.  Do not wear jewelry, make-up, hairpins, clips or nail polish.  Do not wear lotions, powders, or perfumes.   Do not shave body from the neck down 48 hours prior to surgery just in case you cut yourself which could leave a site for infection.  Also, freshly shaved skin may become irritated if using the CHG soap.  Do not bring valuables to the hospital. [facility] is not responsible for any missing/lost belongings or valuables.   Notify your doctor if there is any change in your medical condition (cold, fever, infection).  Wear comfortable clothing (specific to your surgery type) to the hospital.  After surgery, you can help prevent lung complications by doing breathing exercises.  Take deep breaths and cough every 1-2 hours.   If you are being discharged the day of surgery, you will not be allowed to drive home. You will need a responsible adult (18 years or older) to drive you home and stay with you that night.   If you are taking public transportation, you will need to have a responsible adult (18 years or older) with you. Please confirm with your physician that it is acceptable to use public transportation.   Please call the [facility]. at [phone] if you have any questions about these instructions.  Surgery Visitation Policy:  Patients undergoing a surgery or procedure may have two family members or support persons with  them as long as the person is not COVID-19 positive or experiencing its symptoms.   Inpatient Visitation:    Visiting hours are 7 a.m. to 8 p.m. Up to four visitors are allowed at one time in a patient room, including children. The visitors may rotate out with other people during the day. One designated support person (adult) may remain overnight.

## 2021-06-09 ENCOUNTER — Inpatient Hospital Stay: Payer: 59 | Attending: Internal Medicine | Admitting: Licensed Clinical Social Worker

## 2021-06-09 ENCOUNTER — Encounter: Payer: Self-pay | Admitting: Urgent Care

## 2021-06-09 ENCOUNTER — Encounter
Admission: RE | Admit: 2021-06-09 | Discharge: 2021-06-09 | Disposition: A | Discharge status: Home / Self Care | Payer: 59 | Source: Ambulatory Visit | Attending: Surgery | Admitting: Surgery

## 2021-06-09 DIAGNOSIS — Z79899 Other long term (current) drug therapy: Secondary | ICD-10-CM | POA: Insufficient documentation

## 2021-06-09 DIAGNOSIS — Z01818 Encounter for other preprocedural examination: Secondary | ICD-10-CM | POA: Insufficient documentation

## 2021-06-09 DIAGNOSIS — E119 Type 2 diabetes mellitus without complications: Secondary | ICD-10-CM | POA: Insufficient documentation

## 2021-06-09 DIAGNOSIS — Z01810 Encounter for preprocedural cardiovascular examination: Secondary | ICD-10-CM | POA: Diagnosis not present

## 2021-06-09 DIAGNOSIS — D0511 Intraductal carcinoma in situ of right breast: Secondary | ICD-10-CM | POA: Insufficient documentation

## 2021-06-09 DIAGNOSIS — Z170 Estrogen receptor positive status [ER+]: Secondary | ICD-10-CM | POA: Insufficient documentation

## 2021-06-09 DIAGNOSIS — Z9013 Acquired absence of bilateral breasts and nipples: Secondary | ICD-10-CM | POA: Insufficient documentation

## 2021-06-09 LAB — BASIC METABOLIC PANEL
Anion gap: 7 (ref 5–15)
BUN: 14 mg/dL (ref 6–20)
CO2: 26 mmol/L (ref 22–32)
Calcium: 8.8 mg/dL — ABNORMAL LOW (ref 8.9–10.3)
Chloride: 105 mmol/L (ref 98–111)
Creatinine, Ser: 0.87 mg/dL (ref 0.44–1.00)
GFR, Estimated: >60 mL/min (ref >60)
Glucose, Bld: 163 mg/dL — ABNORMAL HIGH (ref 70–99)
Potassium: 4.1 mmol/L (ref 3.5–5.1)
Sodium: 138 mmol/L (ref 135–145)

## 2021-06-09 LAB — CBC
HCT: 42.1 % (ref 36.0–46.0)
Hemoglobin: 13.4 g/dL (ref 12.0–15.0)
MCH: 28.8 pg (ref 26.0–34.0)
MCHC: 31.8 g/dL (ref 30.0–36.0)
MCV: 90.5 fL (ref 80.0–100.0)
Platelets: 199 10*3/uL (ref 150–400)
RBC: 4.65 MIL/uL (ref 3.87–5.11)
RDW: 15.3 % (ref 11.5–15.5)
WBC: 5.7 10*3/uL (ref 4.0–10.5)
nRBC: 0 % (ref 0.0–0.2)

## 2021-06-09 LAB — HEMOGLOBIN A1C
Hgb A1c MFr Bld: 7.7 % — ABNORMAL HIGH (ref 4.8–5.6)
Mean Plasma Glucose: 174.29 mg/dL

## 2021-06-09 NOTE — Progress Notes (Signed)
[place] CSW Progress Note  Holiday representative met with patient to discuss adjustment issues pre-surgery procedure. Patient reports she is feeling very anxious and is trying to manage the anxiety.  Patient stated she stopped smoking and she feel comfortable with it and is currently not experiencing withdrawal.  Patient stated she is "eating her feelings' and is trying to find ways to curve disordered eating and channel it in a healthier manner.  CSW and patient discussed different ways to address disordered eating and how to prevent triggered eating when she is very anxious.  Patient and CSW discussed her speaking with her mother about assisting with purchasing healthier food options and for those to be available to her when she feels triggered to eat.  Patient and CSW discussed recovery after the procedure and how that may affect her anxiety and we discussed staying active once she is recovered, to help with the anxiety and triggered eating.  Patient will continue to communicate her needs to her family and support system, and practicing flexibility and grace when feelings of anxiety are triggered.  Patient stated, " I will try to remain flexible." Patient and CSW discussed meeting after surgery and CSW agreed to contact patient a week after surgery.    Adelene Amas, LCSW

## 2021-06-13 ENCOUNTER — Inpatient Hospital Stay: Payer: 59 | Admitting: Licensed Clinical Social Worker

## 2021-06-13 ENCOUNTER — Encounter: Payer: Self-pay | Admitting: *Deleted

## 2021-06-13 ENCOUNTER — Telehealth: Payer: Self-pay | Admitting: *Deleted

## 2021-06-13 DIAGNOSIS — D0511 Intraductal carcinoma in situ of right breast: Secondary | ICD-10-CM

## 2021-06-13 NOTE — Progress Notes (Signed)
[place] CSW Progress Note  Holiday representative met with caregiver to discuss patient care giving and support post-op. Patient's mother Jahnavi Muratore, will be the main caregiver after surgery on Wed. 06/14/21.  Ms. Kozma and CSW discussed her concerns with assisting the patient during recovery.  CSW and Ms. Rottman identified different ways to engage in effective communication including making request as specific as possible, discussing daily routine and expectations with patient, establishing boundaries and using "I statements".  CSW recommended Ms. Kopp speak with the patient about physical needs including transfers from bed to seat and bathroom transfers, and discussing safety concerns and when they will agree to contact the physician if needed. CSW and Ms. Vigna made a list of thing she would like to address with the patient to help make the recovery period easier for both of them.  Ms. Spangle agreed to contact CSW if there were needs or concerns.     Adelene Amas, LCSW

## 2021-06-13 NOTE — Progress Notes (Signed)
Patient asking for a letter to be submitted to [employer] in order to apply for a grant. MD signed letter emailed to [email or web address].

## 2021-06-13 NOTE — Telephone Encounter (Signed)
Heather Cain called Friday and said she is having a bilateral mastectomy on Wed 06/15/21 at [facility] with Dr. Nicholes Stairs. She asked how long she would need to wait for reconstruction and what her next steps are. Message forwarded to Dr. Claudia Desanctis. I spoke with Heather Cain today and advised her (per Dr. Claudia Desanctis) to wait 6 months after mastectomy to do delayed reconstruction and continue to try to reduce her BMI and stop smoking during that time - then set up a visit in 5-6 months to re-evaluate. She states she has stopped smoking (and all nicotine products) and would like to have surgery before the end of the year if possible. States she will call back after her surgery to set up f/u appointment for later this year.

## 2021-06-13 NOTE — Telephone Encounter (Signed)
Patient called stating that she is trying to get financial assistance form the [facility] and she needs a letter form Dr B as to her diagnosed etc. Please sed to [employer] once written

## 2021-06-14 MED ORDER — CELECOXIB 200 MG PO CAPS: 200.0000 mg | ORAL_CAPSULE | ORAL | Status: AC

## 2021-06-14 MED ORDER — ORAL CARE MOUTH RINSE: 15.0000 mL | Freq: Once | OROMUCOSAL | Status: AC

## 2021-06-14 MED ORDER — SODIUM CHLORIDE 0.9 % IV SOLN: INTRAVENOUS | Status: DC

## 2021-06-14 MED ORDER — FAMOTIDINE 20 MG PO TABS: 20.0000 mg | ORAL_TABLET | Freq: Once | ORAL | Status: AC

## 2021-06-14 MED ORDER — CHLORHEXIDINE GLUCONATE 0.12 % MT SOLN: 15.0000 mL | Freq: Once | OROMUCOSAL | Status: AC

## 2021-06-14 MED ORDER — CEFAZOLIN IN SODIUM CHLORIDE 3-0.9 GM/100ML-% IV SOLN
3.0000 g | INTRAVENOUS | Status: AC
  Administered 2021-06-15: 3 g via INTRAVENOUS
  Filled 2021-06-14: qty 100

## 2021-06-14 MED ORDER — GABAPENTIN 300 MG PO CAPS: 300.0000 mg | ORAL_CAPSULE | ORAL | Status: AC

## 2021-06-14 MED ORDER — CHLORHEXIDINE GLUCONATE CLOTH 2 % EX PADS
6.0000 | MEDICATED_PAD | Freq: Once | CUTANEOUS | Status: AC
  Administered 2021-06-15: 6 via TOPICAL

## 2021-06-14 MED ORDER — BUPIVACAINE LIPOSOME 1.3 % IJ SUSP: 20.0000 mL | Freq: Once | INTRAMUSCULAR | Status: DC

## 2021-06-14 MED ORDER — ACETAMINOPHEN 500 MG PO TABS: 1000.0000 mg | ORAL_TABLET | ORAL | Status: AC

## 2021-06-15 ENCOUNTER — Ambulatory Visit: Payer: 59 | Admitting: Urgent Care

## 2021-06-15 ENCOUNTER — Encounter
Admission: AD | Disposition: A | Discharge status: Home / Self Care | Payer: Self-pay | Source: Ambulatory Visit | Attending: Surgery

## 2021-06-15 ENCOUNTER — Other Ambulatory Visit: Payer: Self-pay

## 2021-06-15 ENCOUNTER — Encounter: Payer: Self-pay | Admitting: Surgery

## 2021-06-15 ENCOUNTER — Inpatient Hospital Stay
Admission: AD | Admit: 2021-06-15 | Discharge: 2021-06-18 | DRG: 908 | Disposition: A | Discharge status: Home / Self Care | Payer: 59 | Source: Ambulatory Visit | Attending: Surgery | Admitting: Surgery

## 2021-06-15 ENCOUNTER — Ambulatory Visit
Admission: RE | Admit: 2021-06-15 | Discharge: 2021-06-15 | Disposition: A | Discharge status: Home / Self Care | Payer: 59 | Source: Ambulatory Visit | Attending: Surgery | Admitting: Surgery

## 2021-06-15 DIAGNOSIS — F17210 Nicotine dependence, cigarettes, uncomplicated: Secondary | ICD-10-CM | POA: Diagnosis present

## 2021-06-15 DIAGNOSIS — Z808 Family history of malignant neoplasm of other organs or systems: Secondary | ICD-10-CM

## 2021-06-15 DIAGNOSIS — Z7984 Long term (current) use of oral hypoglycemic drugs: Secondary | ICD-10-CM

## 2021-06-15 DIAGNOSIS — Z8049 Family history of malignant neoplasm of other genital organs: Secondary | ICD-10-CM

## 2021-06-15 DIAGNOSIS — Z803 Family history of malignant neoplasm of breast: Secondary | ICD-10-CM

## 2021-06-15 DIAGNOSIS — Z885 Allergy status to narcotic agent status: Secondary | ICD-10-CM

## 2021-06-15 DIAGNOSIS — Z4001 Encounter for prophylactic removal of breast: Secondary | ICD-10-CM | POA: Diagnosis not present

## 2021-06-15 DIAGNOSIS — D0511 Intraductal carcinoma in situ of right breast: Secondary | ICD-10-CM

## 2021-06-15 DIAGNOSIS — I959 Hypotension, unspecified: Secondary | ICD-10-CM | POA: Diagnosis present

## 2021-06-15 DIAGNOSIS — E1165 Type 2 diabetes mellitus with hyperglycemia: Secondary | ICD-10-CM | POA: Diagnosis present

## 2021-06-15 DIAGNOSIS — Z8052 Family history of malignant neoplasm of bladder: Secondary | ICD-10-CM

## 2021-06-15 DIAGNOSIS — E7800 Pure hypercholesterolemia, unspecified: Secondary | ICD-10-CM | POA: Diagnosis not present

## 2021-06-15 DIAGNOSIS — Z01818 Encounter for other preprocedural examination: Secondary | ICD-10-CM

## 2021-06-15 DIAGNOSIS — Z79899 Other long term (current) drug therapy: Secondary | ICD-10-CM

## 2021-06-15 DIAGNOSIS — E119 Type 2 diabetes mellitus without complications: Secondary | ICD-10-CM

## 2021-06-15 DIAGNOSIS — L7632 Postprocedural hematoma of skin and subcutaneous tissue following other procedure: Principal | ICD-10-CM | POA: Diagnosis present

## 2021-06-15 DIAGNOSIS — Z6841 Body Mass Index (BMI) 40.0 and over, adult: Secondary | ICD-10-CM

## 2021-06-15 DIAGNOSIS — Y838 Other surgical procedures as the cause of abnormal reaction of the patient, or of later complication, without mention of misadventure at the time of the procedure: Secondary | ICD-10-CM | POA: Diagnosis present

## 2021-06-15 DIAGNOSIS — E781 Pure hyperglyceridemia: Secondary | ICD-10-CM | POA: Diagnosis present

## 2021-06-15 HISTORY — PX: TOTAL MASTECTOMY: SHX6129

## 2021-06-15 HISTORY — PX: SIMPLE MASTECTOMY WITH AXILLARY SENTINEL NODE BIOPSY: SHX6098

## 2021-06-15 LAB — GLUCOSE, CAPILLARY
Glucose-Capillary: 137 mg/dL — ABNORMAL HIGH (ref 70–99)
Glucose-Capillary: 167 mg/dL — ABNORMAL HIGH (ref 70–99)
Glucose-Capillary: 278 mg/dL — ABNORMAL HIGH (ref 70–99)

## 2021-06-15 SURGERY — SIMPLE MASTECTOMY WITH AXILLARY SENTINEL NODE BIOPSY
Anesthesia: General | Laterality: Right

## 2021-06-15 MED ORDER — ISOSULFAN BLUE 1 % ~~LOC~~ SOLN
SUBCUTANEOUS | Status: DC | PRN
  Administered 2021-06-15: 5 mL via SUBCUTANEOUS

## 2021-06-15 MED ORDER — MIDAZOLAM HCL 2 MG/2ML IJ SOLN
INTRAMUSCULAR | Status: AC
  Filled 2021-06-15: qty 2

## 2021-06-15 MED ORDER — DROPERIDOL 2.5 MG/ML IJ SOLN
0.6250 mg | Freq: Once | INTRAMUSCULAR | Status: AC | PRN
  Administered 2021-06-15: 0.625 mg via INTRAVENOUS

## 2021-06-15 MED ORDER — BUPIVACAINE LIPOSOME 1.3 % IJ SUSP
INTRAMUSCULAR | Status: AC
  Filled 2021-06-15: qty 20

## 2021-06-15 MED ORDER — DEXAMETHASONE SODIUM PHOSPHATE 10 MG/ML IJ SOLN
INTRAMUSCULAR | Status: DC | PRN
  Administered 2021-06-15: 10 mg via INTRAVENOUS

## 2021-06-15 MED ORDER — ONDANSETRON HCL 4 MG/2ML IJ SOLN
INTRAMUSCULAR | Status: DC | PRN
  Administered 2021-06-15: 4 mg via INTRAVENOUS

## 2021-06-15 MED ORDER — HYDROMORPHONE HCL 1 MG/ML IJ SOLN
1.0000 mg | INTRAMUSCULAR | Status: DC | PRN
  Filled 2021-06-15: qty 1

## 2021-06-15 MED ORDER — SUGAMMADEX SODIUM 500 MG/5ML IV SOLN
INTRAVENOUS | Status: DC | PRN
  Administered 2021-06-15: 300 mg via INTRAVENOUS

## 2021-06-15 MED ORDER — PROMETHAZINE HCL 25 MG/ML IJ SOLN: 6.2500 mg | INTRAMUSCULAR | Status: DC | PRN

## 2021-06-15 MED ORDER — ACETAMINOPHEN 10 MG/ML IV SOLN: 1000.0000 mg | Freq: Once | INTRAVENOUS | Status: DC | PRN

## 2021-06-15 MED ORDER — KETOROLAC TROMETHAMINE 30 MG/ML IJ SOLN
30.0000 mg | Freq: Four times a day (QID) | INTRAMUSCULAR | Status: DC | PRN
  Filled 2021-06-15: qty 1

## 2021-06-15 MED ORDER — FLUTICASONE PROPIONATE 50 MCG/ACT NA SUSP
1.0000 | Freq: Every day | NASAL | Status: DC
Start: 2021-06-15 — End: 2021-06-18
  Administered 2021-06-15: 1 via NASAL
  Filled 2021-06-15: qty 16

## 2021-06-15 MED ORDER — ROCURONIUM BROMIDE 100 MG/10ML IV SOLN
INTRAVENOUS | Status: DC | PRN
  Administered 2021-06-15: 20 mg via INTRAVENOUS
  Administered 2021-06-15: 60 mg via INTRAVENOUS
  Administered 2021-06-15 (×2): 20 mg via INTRAVENOUS

## 2021-06-15 MED ORDER — PHENYLEPHRINE 80 MCG/ML (10ML) SYRINGE FOR IV PUSH (FOR BLOOD PRESSURE SUPPORT)
PREFILLED_SYRINGE | INTRAVENOUS | Status: DC | PRN
  Administered 2021-06-15 (×3): 80 ug via INTRAVENOUS

## 2021-06-15 MED ORDER — ACETAMINOPHEN 500 MG PO TABS
1000.0000 mg | ORAL_TABLET | Freq: Four times a day (QID) | ORAL | Status: DC
  Administered 2021-06-15 – 2021-06-16 (×3): 1000 mg via ORAL
  Filled 2021-06-15 (×3): qty 2

## 2021-06-15 MED ORDER — GLIMEPIRIDE 2 MG PO TABS
2.0000 mg | ORAL_TABLET | Freq: Every day | ORAL | Status: DC
  Administered 2021-06-17 – 2021-06-18 (×2): 2 mg via ORAL
  Filled 2021-06-15 (×3): qty 1

## 2021-06-15 MED ORDER — FENTANYL CITRATE (PF) 100 MCG/2ML IJ SOLN
INTRAMUSCULAR | Status: AC
  Filled 2021-06-15: qty 2

## 2021-06-15 MED ORDER — GABAPENTIN 300 MG PO CAPS
ORAL_CAPSULE | ORAL | Status: AC
  Administered 2021-06-15: 300 mg via ORAL
  Filled 2021-06-15: qty 1

## 2021-06-15 MED ORDER — OXYCODONE HCL 5 MG PO TABS: 5.0000 mg | ORAL_TABLET | Freq: Once | ORAL | Status: DC | PRN

## 2021-06-15 MED ORDER — MIDAZOLAM HCL 2 MG/2ML IJ SOLN
INTRAMUSCULAR | Status: DC | PRN
  Administered 2021-06-15: 2 mg via INTRAVENOUS

## 2021-06-15 MED ORDER — BUPIVACAINE HCL (PF) 0.25 % IJ SOLN
INTRAMUSCULAR | Status: AC
  Filled 2021-06-15: qty 30

## 2021-06-15 MED ORDER — KETOROLAC TROMETHAMINE 30 MG/ML IJ SOLN
30.0000 mg | Freq: Four times a day (QID) | INTRAMUSCULAR | Status: AC
  Administered 2021-06-15: 30 mg via INTRAVENOUS
  Filled 2021-06-15: qty 1

## 2021-06-15 MED ORDER — OXYCODONE HCL 5 MG/5ML PO SOLN: 5.0000 mg | Freq: Once | ORAL | Status: DC | PRN

## 2021-06-15 MED ORDER — ONDANSETRON HCL 4 MG/2ML IJ SOLN
4.0000 mg | Freq: Four times a day (QID) | INTRAMUSCULAR | Status: DC | PRN
  Administered 2021-06-16: 4 mg via INTRAVENOUS
  Filled 2021-06-15: qty 2

## 2021-06-15 MED ORDER — ONDANSETRON 4 MG PO TBDP: 4.0000 mg | ORAL_TABLET | Freq: Four times a day (QID) | ORAL | Status: DC | PRN

## 2021-06-15 MED ORDER — TECHNETIUM TC 99M TILMANOCEPT KIT
1.0000 | PACK | Freq: Once | INTRAVENOUS | Status: AC
  Administered 2021-06-15: 1.1 via INTRADERMAL

## 2021-06-15 MED ORDER — CEFAZOLIN IN SODIUM CHLORIDE 3-0.9 GM/100ML-% IV SOLN
3.0000 g | Freq: Three times a day (TID) | INTRAVENOUS | Status: AC
  Administered 2021-06-15: 3 g via INTRAVENOUS
  Filled 2021-06-15: qty 100

## 2021-06-15 MED ORDER — CHLORHEXIDINE GLUCONATE 0.12 % MT SOLN
OROMUCOSAL | Status: AC
  Administered 2021-06-15: 15 mL via OROMUCOSAL
  Filled 2021-06-15: qty 15

## 2021-06-15 MED ORDER — STERILE WATER FOR IRRIGATION IR SOLN
Status: DC | PRN
  Administered 2021-06-15: 1000 mL

## 2021-06-15 MED ORDER — LORATADINE 10 MG PO TABS
10.0000 mg | ORAL_TABLET | Freq: Every day | ORAL | Status: DC
  Administered 2021-06-15 – 2021-06-18 (×3): 10 mg via ORAL
  Filled 2021-06-15 (×3): qty 1

## 2021-06-15 MED ORDER — BUPIVACAINE LIPOSOME 1.3 % IJ SUSP
INTRAMUSCULAR | Status: DC | PRN
  Administered 2021-06-15: 50 mL

## 2021-06-15 MED ORDER — CELECOXIB 200 MG PO CAPS
ORAL_CAPSULE | ORAL | Status: AC
  Administered 2021-06-15: 200 mg via ORAL
  Filled 2021-06-15: qty 1

## 2021-06-15 MED ORDER — PROPOFOL 10 MG/ML IV BOLUS
INTRAVENOUS | Status: DC | PRN
  Administered 2021-06-15: 200 mg via INTRAVENOUS

## 2021-06-15 MED ORDER — LIDOCAINE HCL (CARDIAC) PF 100 MG/5ML IV SOSY
PREFILLED_SYRINGE | INTRAVENOUS | Status: DC | PRN
  Administered 2021-06-15: 100 mg via INTRAVENOUS

## 2021-06-15 MED ORDER — ZOLPIDEM TARTRATE 5 MG PO TABS
5.0000 mg | ORAL_TABLET | Freq: Every evening | ORAL | Status: DC | PRN
  Filled 2021-06-15: qty 1

## 2021-06-15 MED ORDER — ACETAMINOPHEN 500 MG PO TABS
ORAL_TABLET | ORAL | Status: AC
  Administered 2021-06-15: 1000 mg via ORAL
  Filled 2021-06-15: qty 2

## 2021-06-15 MED ORDER — FAMOTIDINE 20 MG PO TABS
ORAL_TABLET | ORAL | Status: AC
  Administered 2021-06-15: 20 mg via ORAL
  Filled 2021-06-15: qty 1

## 2021-06-15 MED ORDER — INSULIN ASPART 100 UNIT/ML IJ SOLN
0.0000 [IU] | Freq: Three times a day (TID) | INTRAMUSCULAR | Status: DC
  Administered 2021-06-16 (×2): 4 [IU] via SUBCUTANEOUS
  Administered 2021-06-17: 3 [IU] via SUBCUTANEOUS
  Administered 2021-06-17: 4 [IU] via SUBCUTANEOUS
  Administered 2021-06-17 – 2021-06-18 (×3): 3 [IU] via SUBCUTANEOUS
  Filled 2021-06-15 (×7): qty 1

## 2021-06-15 MED ORDER — ROSUVASTATIN CALCIUM 10 MG PO TABS
20.0000 mg | ORAL_TABLET | Freq: Every day | ORAL | Status: DC
  Administered 2021-06-15 – 2021-06-18 (×3): 20 mg via ORAL
  Filled 2021-06-15 (×3): qty 2

## 2021-06-15 MED ORDER — DROPERIDOL 2.5 MG/ML IJ SOLN
INTRAMUSCULAR | Status: AC
  Filled 2021-06-15: qty 2

## 2021-06-15 MED ORDER — ISOSULFAN BLUE 1 % ~~LOC~~ SOLN
SUBCUTANEOUS | Status: AC
Start: 2021-06-15 — End: ?
  Filled 2021-06-15: qty 5

## 2021-06-15 MED ORDER — FENTANYL CITRATE (PF) 100 MCG/2ML IJ SOLN: 25.0000 ug | INTRAMUSCULAR | Status: DC | PRN

## 2021-06-15 MED ORDER — FENTANYL CITRATE (PF) 100 MCG/2ML IJ SOLN
INTRAMUSCULAR | Status: DC | PRN
Start: 2021-06-15 — End: 2021-06-15
  Administered 2021-06-15 (×2): 50 ug via INTRAVENOUS
  Administered 2021-06-15: 100 ug via INTRAVENOUS

## 2021-06-15 MED ORDER — GABAPENTIN 100 MG PO CAPS
200.0000 mg | ORAL_CAPSULE | Freq: Two times a day (BID) | ORAL | Status: DC
  Administered 2021-06-15: 200 mg via ORAL
  Filled 2021-06-15: qty 2

## 2021-06-15 SURGICAL SUPPLY — 52 items
APL PRP STRL LF DISP 70% ISPRP (MISCELLANEOUS) ×2
BINDER BREAST XXLRG (GAUZE/BANDAGES/DRESSINGS) ×1 IMPLANT
BLADE SURG SZ10 CARB STEEL (BLADE) ×6 IMPLANT
BULB RESERV EVAC DRAIN JP 100C (MISCELLANEOUS) ×2 IMPLANT
CHLORAPREP W/TINT 26 (MISCELLANEOUS) ×3 IMPLANT
CLEANER CAUTERY TIP 5X5 PAD (MISCELLANEOUS) IMPLANT
CLIP VESOCCLUDE MED 6/CT (CLIP) IMPLANT
COVER LIGHT HANDLE STERIS (MISCELLANEOUS) ×1 IMPLANT
DRAIN CHANNEL JP 15F RND 16 (MISCELLANEOUS) ×2 IMPLANT
DRAPE LAPAROTOMY TRNSV 106X77 (MISCELLANEOUS) ×3 IMPLANT
DRSG GAUZE FLUFF 36X18 (GAUZE/BANDAGES/DRESSINGS) ×3 IMPLANT
DRSG TELFA 3X8 NADH (GAUZE/BANDAGES/DRESSINGS) ×6 IMPLANT
ELECT CAUTERY BLADE 6.4 (BLADE) ×3 IMPLANT
ELECT CAUTERY BLADE TIP 2.5 (TIP) ×3
ELECT REM PT RETURN 9FT ADLT (ELECTROSURGICAL) ×3
ELECTRODE CAUTERY BLDE TIP 2.5 (TIP) ×2 IMPLANT
ELECTRODE REM PT RTRN 9FT ADLT (ELECTROSURGICAL) ×2 IMPLANT
GAUZE 4X4 16PLY ~~LOC~~+RFID DBL (SPONGE) ×3 IMPLANT
GLOVE ORTHO TXT STRL SZ7.5 (GLOVE) ×6 IMPLANT
GOWN STRL REUS W/ TWL LRG LVL3 (GOWN DISPOSABLE) ×2 IMPLANT
GOWN STRL REUS W/ TWL XL LVL3 (GOWN DISPOSABLE) ×2 IMPLANT
GOWN STRL REUS W/TWL LRG LVL3 (GOWN DISPOSABLE) ×3
GOWN STRL REUS W/TWL XL LVL3 (GOWN DISPOSABLE) ×3
HEMOSTAT ARISTA ABSORB 3G PWDR (HEMOSTASIS) IMPLANT
KIT TURNOVER KIT A (KITS) ×3 IMPLANT
MANIFOLD NEPTUNE II (INSTRUMENTS) ×3 IMPLANT
NEEDLE HYPO 22GX1.5 SAFETY (NEEDLE) ×3 IMPLANT
PACK BASIN MAJOR ARMC (MISCELLANEOUS) ×3 IMPLANT
PAD ABD DERMACEA PRESS 5X9 (GAUZE/BANDAGES/DRESSINGS) ×2 IMPLANT
PAD CLEANER CAUTERY TIP 5X5 (MISCELLANEOUS) ×1
PAD DRESSING TELFA 3X8 NADH (GAUZE/BANDAGES/DRESSINGS) ×2 IMPLANT
SLEVE PROBE SENORX GAMMA FIND (MISCELLANEOUS) ×1 IMPLANT
SPONGE T-LAP 18X18 ~~LOC~~+RFID (SPONGE) ×9 IMPLANT
STAPLER SKIN PROX 35W (STAPLE) IMPLANT
SUT ETHILON 3-0 FS-10 30 BLK (SUTURE) ×9
SUT MNCRL AB 4-0 PS2 18 (SUTURE) ×6 IMPLANT
SUT SILK 2 0 (SUTURE) ×3
SUT SILK 2 0 SH (SUTURE) ×3 IMPLANT
SUT SILK 2-0 18XBRD TIE 12 (SUTURE) ×2 IMPLANT
SUT SILK 3-0 (SUTURE) ×1 IMPLANT
SUT V-LOC 90 ABS DVC 3-0 CL (SUTURE) ×1 IMPLANT
SUT VIC AB 3-0 54X BRD REEL (SUTURE) ×2 IMPLANT
SUT VIC AB 3-0 BRD 54 (SUTURE) ×3
SUT VIC AB 3-0 SH 27 (SUTURE) ×6
SUT VIC AB 3-0 SH 27X BRD (SUTURE) ×4 IMPLANT
SUTURE EHLN 3-0 FS-10 30 BLK (SUTURE) ×2 IMPLANT
SWABSTK COMLB BENZOIN TINCTURE (MISCELLANEOUS) IMPLANT
SYR 10ML LL (SYRINGE) ×1 IMPLANT
SYR 20ML LL LF (SYRINGE) ×3 IMPLANT
SYR BULB IRRIG 60ML STRL (SYRINGE) ×3 IMPLANT
WATER STERILE IRR 1000ML POUR (IV SOLUTION) ×3 IMPLANT
WATER STERILE IRR 500ML POUR (IV SOLUTION) ×3 IMPLANT

## 2021-06-15 NOTE — Transfer of Care (Signed)
Immediate Anesthesia Transfer of Care Note  Patient: Heather Cain  Procedure(s) Performed: SIMPLE MASTECTOMY WITH AXILLARY SENTINEL NODE BIOPSY, Total (Right) TOTAL MASTECTOMY, prophylactic left (Left)  Patient Location: PACU  Anesthesia Type:General  Level of Consciousness: sedated  Airway & Oxygen Therapy: Patient Spontanous Breathing  Post-op Assessment: Report given to RN and Post -op Vital signs reviewed and stable  Post vital signs: Reviewed and stable  Last Vitals:  Vitals Value Taken Time  BP 148/93 06/15/21 1722  Temp    Pulse 95 06/15/21 1723  Resp    SpO2 98 % 06/15/21 1723  Vitals shown include unvalidated device data.  Last Pain:  Vitals:   06/15/21 1306  TempSrc: Oral  PainSc: 0-No pain         Complications: No notable events documented.

## 2021-06-15 NOTE — Interval H&P Note (Signed)
History and Physical Interval Note:  06/15/2021 1:38 PM  Heather Cain  has presented today for surgery, with the diagnosis of DCIS right.  The various methods of treatment have been discussed with the patient and family. After consideration of risks, benefits and other options for treatment, the patient has consented to  Procedure(s): SIMPLE MASTECTOMY WITH AXILLARY SENTINEL NODE BIOPSY, Total (Right) TOTAL MASTECTOMY, prophylactic left (Left) as a surgical intervention.  The patient's history has been reviewed, patient examined, no change in status, stable for surgery.  I have reviewed the patient's chart and labs.  Questions were answered to the patient's satisfaction.    The right side is marked as it is the sentinel node side.   Ronny Bacon

## 2021-06-15 NOTE — Anesthesia Procedure Notes (Signed)
Procedure Name: Intubation Date/Time: 06/15/2021 2:21 AM Performed by: Lorie Apley, CRNA Pre-anesthesia Checklist: Patient identified, Patient being monitored, Timeout performed, Emergency Drugs available and Suction available Patient Re-evaluated:Patient Re-evaluated prior to induction Oxygen Delivery Method: Circle system utilized Preoxygenation: Pre-oxygenation with 100% oxygen Induction Type: IV induction Ventilation: Mask ventilation without difficulty Laryngoscope Size: Mac, 3 and McGraph Grade View: Grade I Tube type: Oral Tube size: 7.0 mm Number of attempts: 1 Airway Equipment and Method: Stylet Placement Confirmation: ETT inserted through vocal cords under direct vision, positive ETCO2 and breath sounds checked- equal and bilateral Secured at: 21 cm Tube secured with: Tape Dental Injury: Teeth and Oropharynx as per pre-operative assessment

## 2021-06-15 NOTE — Op Note (Addendum)
Procedure Date:  06/15/2021  Pre-operative Diagnosis:  DCIS right breast. Abnormal MRI left breast.    Post-operative Diagnosis: Same  Procedure:  Bilateral simple mastectomy and right axillary sentinel lymph node biopsy  Surgeon:  Ronny Bacon, M.D., [facility]  Anesthesia:  General endotracheal  Estimated Blood Loss:  50 ml  Specimens:  left and right breasts, right sentinel axillary lymph node.   Complications:  none.   Indications for Procedure:  This is a 51 y.o. female who presents with abnormal breast MRI bilaterally with prior biopsy confirmation of right breast DCIS.  The risks of bleeding, infection, injury to surrounding structures, hematoma, seroma, open wound, cosmetic deformity, and the need for further surgery were all discussed with the patient and was willing to proceed.  Prior to this procedure, the patient had undergone sentinel lymphoscintigraphy.  Description of Procedure: The patient was correctly identified in the preoperative area and brought into the operating room.  The patient was placed supine with VTE prophylaxis in place.  Appropriate time-outs were performed.  Anesthesia was induced and the patient was intubated.  Appropriate antibiotics were infused.  A visual dye was injected in the right periareolar region under aseptic conditions, massage administered for 5 minutes. The bilateral chest and axilla were prepped and draped in usual sterile fashion.    An elliptical incision was then completed over the left breast encompassing the nipple-areolar complex, with attention to the lesion of which we are concerned.  Using electrosurgery, subcutaneous flaps were created superiorly to the clavicle, inferiorly to the inframammary fold, medially to the sternum, and laterally to the latissimus dorsi with careful attention to create flaps of adequate thickness.  The breast tissue was then dissected with the pectoralis fascia as the deep margin.  2-0 silk suture was used to  mark the specimen as short superior and long lateral.  The cavity was then irrigated and hemostasis was assured with electrocautery.  A moist lap was placed in the wound, deferring closure until the other side was completed.  An elliptical incision was then completed over the right breast encompassing the nipple-areolar complex, with attention to the lesion of which we are concerned.  Using electrosurgery, subcutaneous flaps were created superiorly to the clavicle, inferiorly to the inframammary fold, medially to the sternum, and laterally to the latissimus dorsi with careful attention to create flaps of adequate thickness.  The breast tissue was then dissected with the pectoralis fascia as the deep margin.  2-0 silk suture was used to mark the specimen as short superior and long lateral.  The cavity was then irrigated and hemostasis was assured with electrocautery.    I approached the right axillary space where cautery was used to dissect down the subcutaneous tissue and the hand-held probe was used to guide dissection. A lymph node was identified and resected.  This had a count of >3000.  No additional lymph nodes were identified.  The wound bed had a count of <100.  The cavity was irrigated and hemostasis was assured with electrocautery.    50 ml of Exparel solution, diluted with 0.25% Marcaine with epinephrine was infiltrated into the deep tissues of the both chest wall musculature.  80 Fr Blake drains were placed via lateral stab incision to drain the mastectomy wound beds.  The incision on the right was then closed in two layers with 3-0 V-Lock and staples.   The left mastectomy incision was closed in like manner. The wound was dressed with fluff gauze and a surgical bra was  placed.  The patient was emerged from anesthesia and extubated and brought to the recovery room for further management.  The patient tolerated the procedure well and all counts were correct at the end of the case.  Sentinel  Node Biopsy Synoptic Operative Report  Operation performed with curative intent:Yes  Tracer(s) used to identify sentinel nodes in the upfront surgery (non-neoadjuvant) setting (select all that apply):Dye and Radioactive Tracer  Tracer(s) used to identify sentinel nodes in the neoadjuvant setting (select all that apply):N/A  All nodes (colored or non-colored) present at the end of a dye-filled lymphatic channel were removed:Yes   All significantly radioactive nodes were removed:Yes  All palpable suspicious nodes were removed:N/A  Biopsy-proven positive nodes marked with clips prior to chemotherapy were identified and removed:N/A    Ronny Bacon M.D., [facility] 06/15/2021, 5:11 PM

## 2021-06-15 NOTE — Anesthesia Preprocedure Evaluation (Addendum)
Anesthesia Evaluation  Patient identified by MRN, date of birth, ID band Patient awake    Reviewed: Allergy & Precautions, NPO status , Patient's Chart, lab work & pertinent test results  History of Anesthesia Complications (+) PONV and history of anesthetic complications  Airway Mallampati: I  TM Distance: >3 FB Neck ROM: full    Dental no notable dental hx.    Pulmonary former smoker,   Stop bang 4   Pulmonary exam normal        Cardiovascular Exercise Tolerance: Good Normal cardiovascular exam     Neuro/Psych peripheral neuropathy  Neuromuscular disease negative psych ROS   GI/Hepatic negative GI ROS, (+)     substance abuse  marijuana use,   Endo/Other  diabetes, Poorly Controlled, Type 2Morbid obesityA1c 7.7  Renal/GU      Musculoskeletal   Abdominal (+) + obese,   Peds  Hematology negative hematology ROS (+)   Anesthesia Other Findings Past Medical History: No date: DCIS (ductal carcinoma in situ) of breast No date: Diabetes mellitus without complication (HCC) No date: Family history of bladder cancer No date: Family history of uterine cancer No date: High cholesterol No date: Neuromuscular disorder ([place])     Comment:  peripheral neuropathy No date: PONV (postoperative nausea and vomiting) No date: Vaginal Pap smear, abnormal  Past Surgical History: 03/08/2021: BREAST BIOPSY; Right     Comment:  right breast bx coil clip -positive 03/24/2021: BREAST BIOPSY; Right     Comment:  stereo biopsy x clip/DUCTAL CARCINOMA IN SITU (DCIS), 05/18/2021: BREAST BIOPSY; Left     Comment:  ribbon clip 05/18/2021: BREAST BIOPSY; Right     Comment:  coil clip No date: CRYOTHERAPY No date: WRIST SURGERY  BMI    Body Mass Index: 57.57 kg/m      Reproductive/Obstetrics negative OB ROS                           Anesthesia Physical Anesthesia Plan  ASA: 3  Anesthesia Plan:  General ETT   Post-op Pain Management: Gabapentin PO (pre-op)*, Tylenol PO (pre-op)* and Celebrex PO (pre-op)*   Induction: Intravenous  PONV Risk Score and Plan: 4 or greater and Ondansetron, Dexamethasone, Midazolam and Promethazine  Airway Management Planned: Oral ETT  Additional Equipment:   Intra-op Plan:   Post-operative Plan: Extubation in OR  Informed Consent: I have reviewed the patients History and Physical, chart, labs and discussed the procedure including the risks, benefits and alternatives for the proposed anesthesia with the patient or authorized representative who has indicated his/her understanding and acceptance.     Dental Advisory Given  Plan Discussed with: Anesthesiologist, CRNA and Surgeon  Anesthesia Plan Comments:        Anesthesia Quick Evaluation

## 2021-06-15 NOTE — Plan of Care (Signed)
  Problem: Skin Integrity: Goal: Risk for impaired skin integrity will decrease Outcome: Progressing   Problem: Education: Goal: Knowledge of General Education information will improve Description: Including pain rating scale, medication(s)/side effects and non-pharmacologic comfort measures Outcome: Progressing   Problem: Education: Goal: Knowledge of General Education information will improve Description: Including pain rating scale, medication(s)/side effects and non-pharmacologic comfort measures Outcome: Progressing   Problem: Activity: Goal: Risk for activity intolerance will decrease Outcome: Progressing   Problem: Pain Managment: Goal: General experience of comfort will improve Outcome: Progressing

## 2021-06-15 NOTE — Anesthesia Postprocedure Evaluation (Signed)
Anesthesia Post Note  Patient: Heather Cain  Procedure(s) Performed: SIMPLE MASTECTOMY WITH AXILLARY SENTINEL NODE BIOPSY, Total (Right) TOTAL MASTECTOMY, prophylactic left (Left)  Patient location during evaluation: PACU Anesthesia Type: General Level of consciousness: awake and alert Pain management: pain level controlled Vital Signs Assessment: post-procedure vital signs reviewed and stable Respiratory status: spontaneous breathing, nonlabored ventilation, respiratory function stable and patient connected to nasal cannula oxygen Cardiovascular status: blood pressure returned to baseline and stable Postop Assessment: no apparent nausea or vomiting Anesthetic complications: no   No notable events documented.   Last Vitals:  Vitals:   06/15/21 1815 06/15/21 1843  BP: 123/71 121/85  Pulse: 90 86  Resp: 19 16  Temp: 36.7 C 36.6 C  SpO2: 93% 94%    Last Pain:  Vitals:   06/15/21 1815  TempSrc:   PainSc: 0-No pain                 Precious Haws Earl Losee

## 2021-06-16 ENCOUNTER — Encounter
Admission: AD | Disposition: A | Discharge status: Home / Self Care | Payer: Self-pay | Source: Ambulatory Visit | Attending: Surgery

## 2021-06-16 ENCOUNTER — Encounter: Payer: Self-pay | Admitting: Surgery

## 2021-06-16 ENCOUNTER — Observation Stay: Payer: 59 | Admitting: Anesthesiology

## 2021-06-16 DIAGNOSIS — Z8052 Family history of malignant neoplasm of bladder: Secondary | ICD-10-CM | POA: Diagnosis not present

## 2021-06-16 DIAGNOSIS — Z79899 Other long term (current) drug therapy: Secondary | ICD-10-CM | POA: Diagnosis not present

## 2021-06-16 DIAGNOSIS — F17210 Nicotine dependence, cigarettes, uncomplicated: Secondary | ICD-10-CM | POA: Diagnosis present

## 2021-06-16 DIAGNOSIS — L7632 Postprocedural hematoma of skin and subcutaneous tissue following other procedure: Secondary | ICD-10-CM | POA: Diagnosis not present

## 2021-06-16 DIAGNOSIS — D0511 Intraductal carcinoma in situ of right breast: Secondary | ICD-10-CM | POA: Diagnosis present

## 2021-06-16 DIAGNOSIS — Z885 Allergy status to narcotic agent status: Secondary | ICD-10-CM | POA: Diagnosis not present

## 2021-06-16 DIAGNOSIS — I959 Hypotension, unspecified: Secondary | ICD-10-CM | POA: Diagnosis present

## 2021-06-16 DIAGNOSIS — Z6841 Body Mass Index (BMI) 40.0 and over, adult: Secondary | ICD-10-CM | POA: Diagnosis not present

## 2021-06-16 DIAGNOSIS — Z7984 Long term (current) use of oral hypoglycemic drugs: Secondary | ICD-10-CM | POA: Diagnosis not present

## 2021-06-16 DIAGNOSIS — Z808 Family history of malignant neoplasm of other organs or systems: Secondary | ICD-10-CM | POA: Diagnosis not present

## 2021-06-16 DIAGNOSIS — Z8049 Family history of malignant neoplasm of other genital organs: Secondary | ICD-10-CM | POA: Diagnosis not present

## 2021-06-16 DIAGNOSIS — Y838 Other surgical procedures as the cause of abnormal reaction of the patient, or of later complication, without mention of misadventure at the time of the procedure: Secondary | ICD-10-CM | POA: Diagnosis present

## 2021-06-16 DIAGNOSIS — E1165 Type 2 diabetes mellitus with hyperglycemia: Secondary | ICD-10-CM | POA: Diagnosis present

## 2021-06-16 DIAGNOSIS — E781 Pure hyperglyceridemia: Secondary | ICD-10-CM | POA: Diagnosis present

## 2021-06-16 DIAGNOSIS — Z803 Family history of malignant neoplasm of breast: Secondary | ICD-10-CM | POA: Diagnosis not present

## 2021-06-16 HISTORY — PX: EVACUATION BREAST HEMATOMA: SHX1537

## 2021-06-16 LAB — CBC
HCT: 31.1 % — ABNORMAL LOW (ref 36.0–46.0)
Hemoglobin: 9.9 g/dL — ABNORMAL LOW (ref 12.0–15.0)
MCH: 29.6 pg (ref 26.0–34.0)
MCHC: 31.8 g/dL (ref 30.0–36.0)
MCV: 93.1 fL (ref 80.0–100.0)
Platelets: 173 10*3/uL (ref 150–400)
RBC: 3.34 MIL/uL — ABNORMAL LOW (ref 3.87–5.11)
RDW: 15.6 % — ABNORMAL HIGH (ref 11.5–15.5)
WBC: 12.5 10*3/uL — ABNORMAL HIGH (ref 4.0–10.5)
nRBC: 0 % (ref 0.0–0.2)

## 2021-06-16 LAB — GLUCOSE, CAPILLARY
Glucose-Capillary: 163 mg/dL — ABNORMAL HIGH (ref 70–99)
Glucose-Capillary: 161 mg/dL — ABNORMAL HIGH (ref 70–99)
Glucose-Capillary: 168 mg/dL — ABNORMAL HIGH (ref 70–99)
Glucose-Capillary: 218 mg/dL — ABNORMAL HIGH (ref 70–99)

## 2021-06-16 LAB — BASIC METABOLIC PANEL
Anion gap: 7 (ref 5–15)
BUN: 19 mg/dL (ref 6–20)
CO2: 23 mmol/L (ref 22–32)
Calcium: 8.2 mg/dL — ABNORMAL LOW (ref 8.9–10.3)
Chloride: 107 mmol/L (ref 98–111)
Creatinine, Ser: 0.88 mg/dL (ref 0.44–1.00)
GFR, Estimated: >60 mL/min (ref >60)
Glucose, Bld: 189 mg/dL — ABNORMAL HIGH (ref 70–99)
Potassium: 4.2 mmol/L (ref 3.5–5.1)
Sodium: 137 mmol/L (ref 135–145)

## 2021-06-16 LAB — TYPE AND SCREEN
ABO/RH(D): A POS
Antibody Screen: NEGATIVE

## 2021-06-16 LAB — POCT PREGNANCY, URINE: Preg Test, Ur: NEGATIVE

## 2021-06-16 LAB — ABO/RH: ABO/RH(D): A POS

## 2021-06-16 SURGERY — EVACUATION, HEMATOMA, BREAST
Anesthesia: General | Site: Breast | Laterality: Right

## 2021-06-16 MED ORDER — ONDANSETRON HCL 4 MG/2ML IJ SOLN: 4.0000 mg | Freq: Four times a day (QID) | INTRAMUSCULAR | Status: DC | PRN

## 2021-06-16 MED ORDER — CALCIUM CHLORIDE 10 % IV SOLN
INTRAVENOUS | Status: AC
Start: 2021-06-16 — End: ?
  Filled 2021-06-16: qty 10

## 2021-06-16 MED ORDER — FENTANYL CITRATE (PF) 100 MCG/2ML IJ SOLN
INTRAMUSCULAR | Status: DC | PRN
Start: 2021-06-16 — End: 2021-06-16
  Administered 2021-06-16: 50 ug via INTRAVENOUS
  Administered 2021-06-16: 25 ug via INTRAVENOUS

## 2021-06-16 MED ORDER — CEFAZOLIN (ANCEF) 1 G IV SOLR
3.0000 g | Freq: Three times a day (TID) | INTRAVENOUS | Status: DC
  Filled 2021-06-16 (×2): qty 3

## 2021-06-16 MED ORDER — SUCCINYLCHOLINE CHLORIDE 200 MG/10ML IV SOSY
PREFILLED_SYRINGE | INTRAVENOUS | Status: DC | PRN
  Administered 2021-06-16: 160 mg via INTRAVENOUS

## 2021-06-16 MED ORDER — SODIUM CHLORIDE 0.9 % IR SOLN
Status: DC | PRN
  Administered 2021-06-16: 1500 mL

## 2021-06-16 MED ORDER — MIDAZOLAM HCL 2 MG/2ML IJ SOLN
INTRAMUSCULAR | Status: AC
  Filled 2021-06-16: qty 2

## 2021-06-16 MED ORDER — FENTANYL CITRATE (PF) 100 MCG/2ML IJ SOLN
INTRAMUSCULAR | Status: AC
  Filled 2021-06-16: qty 2

## 2021-06-16 MED ORDER — PROMETHAZINE HCL 25 MG/ML IJ SOLN: 6.2500 mg | INTRAMUSCULAR | Status: DC | PRN

## 2021-06-16 MED ORDER — BUPIVACAINE-EPINEPHRINE (PF) 0.25% -1:200000 IJ SOLN
INTRAMUSCULAR | Status: DC | PRN
  Administered 2021-06-16: 20 mL via PERINEURAL

## 2021-06-16 MED ORDER — ONDANSETRON 4 MG PO TBDP: 4.0000 mg | ORAL_TABLET | Freq: Four times a day (QID) | ORAL | Status: DC | PRN

## 2021-06-16 MED ORDER — SODIUM CHLORIDE 0.9 % IV SOLN: INTRAVENOUS | Status: DC

## 2021-06-16 MED ORDER — PROPOFOL 10 MG/ML IV BOLUS
INTRAVENOUS | Status: AC
  Filled 2021-06-16: qty 20

## 2021-06-16 MED ORDER — DEXAMETHASONE SODIUM PHOSPHATE 10 MG/ML IJ SOLN
INTRAMUSCULAR | Status: DC | PRN
  Administered 2021-06-16: 4 mg via INTRAVENOUS

## 2021-06-16 MED ORDER — LIDOCAINE HCL (CARDIAC) PF 100 MG/5ML IV SOSY
PREFILLED_SYRINGE | INTRAVENOUS | Status: DC | PRN
  Administered 2021-06-16: 100 mg via INTRAVENOUS

## 2021-06-16 MED ORDER — BUPIVACAINE-EPINEPHRINE (PF) 0.25% -1:200000 IJ SOLN
INTRAMUSCULAR | Status: AC
  Filled 2021-06-16: qty 30

## 2021-06-16 MED ORDER — ACETAMINOPHEN 500 MG PO TABS
1000.0000 mg | ORAL_TABLET | Freq: Four times a day (QID) | ORAL | Status: DC
  Administered 2021-06-16 – 2021-06-18 (×7): 1000 mg via ORAL
  Filled 2021-06-16 (×8): qty 2

## 2021-06-16 MED ORDER — PROPOFOL 10 MG/ML IV BOLUS
INTRAVENOUS | Status: AC
Start: 2021-06-16 — End: ?
  Filled 2021-06-16: qty 20

## 2021-06-16 MED ORDER — OXYCODONE HCL 5 MG PO TABS: 5.0000 mg | ORAL_TABLET | Freq: Once | ORAL | Status: DC | PRN

## 2021-06-16 MED ORDER — LIDOCAINE HCL (PF) 2 % IJ SOLN
INTRAMUSCULAR | Status: AC
  Filled 2021-06-16: qty 5

## 2021-06-16 MED ORDER — DEXAMETHASONE SODIUM PHOSPHATE 10 MG/ML IJ SOLN
INTRAMUSCULAR | Status: AC
  Filled 2021-06-16: qty 1

## 2021-06-16 MED ORDER — FAMOTIDINE 20 MG PO TABS
20.0000 mg | ORAL_TABLET | Freq: Once | ORAL | Status: AC
Start: 2021-06-16 — End: 2021-06-16
  Administered 2021-06-16: 20 mg via ORAL
  Filled 2021-06-16: qty 1

## 2021-06-16 MED ORDER — ACETAMINOPHEN 10 MG/ML IV SOLN
INTRAVENOUS | Status: AC
  Filled 2021-06-16: qty 100

## 2021-06-16 MED ORDER — ONDANSETRON HCL 4 MG/2ML IJ SOLN
INTRAMUSCULAR | Status: AC
  Filled 2021-06-16: qty 2

## 2021-06-16 MED ORDER — PROPOFOL 500 MG/50ML IV EMUL
INTRAVENOUS | Status: DC | PRN
  Administered 2021-06-16: 25 ug/kg/min via INTRAVENOUS

## 2021-06-16 MED ORDER — ROCURONIUM BROMIDE 10 MG/ML (PF) SYRINGE
PREFILLED_SYRINGE | INTRAVENOUS | Status: AC
  Filled 2021-06-16: qty 10

## 2021-06-16 MED ORDER — ALBUMIN HUMAN 5 % IV SOLN
INTRAVENOUS | Status: AC
  Filled 2021-06-16: qty 250

## 2021-06-16 MED ORDER — GABAPENTIN 300 MG PO CAPS
300.0000 mg | ORAL_CAPSULE | Freq: Two times a day (BID) | ORAL | Status: DC
  Administered 2021-06-16 – 2021-06-18 (×5): 300 mg via ORAL
  Filled 2021-06-16 (×6): qty 1

## 2021-06-16 MED ORDER — OXYCODONE HCL 5 MG/5ML PO SOLN: 5.0000 mg | Freq: Once | ORAL | Status: DC | PRN

## 2021-06-16 MED ORDER — OXYCODONE HCL 5 MG PO TABS
5.0000 mg | ORAL_TABLET | ORAL | Status: DC | PRN
  Administered 2021-06-16 – 2021-06-18 (×9): 5 mg via ORAL
  Filled 2021-06-16: qty 2
  Filled 2021-06-16 (×10): qty 1

## 2021-06-16 MED ORDER — ACETAMINOPHEN 10 MG/ML IV SOLN: 1000.0000 mg | Freq: Once | INTRAVENOUS | Status: DC | PRN

## 2021-06-16 MED ORDER — DROPERIDOL 2.5 MG/ML IJ SOLN
0.6250 mg | Freq: Once | INTRAMUSCULAR | Status: DC | PRN
Start: 2021-06-16 — End: 2021-06-16

## 2021-06-16 MED ORDER — ALBUMIN HUMAN 5 % IV SOLN: INTRAVENOUS | Status: DC | PRN

## 2021-06-16 MED ORDER — SCOPOLAMINE 1 MG/3DAYS TD PT72
1.0000 | MEDICATED_PATCH | Freq: Once | TRANSDERMAL | Status: DC
  Filled 2021-06-16: qty 1

## 2021-06-16 MED ORDER — ONDANSETRON HCL 4 MG/2ML IJ SOLN
INTRAMUSCULAR | Status: DC | PRN
  Administered 2021-06-16: 4 mg via INTRAVENOUS

## 2021-06-16 MED ORDER — MIDAZOLAM HCL 2 MG/2ML IJ SOLN
INTRAMUSCULAR | Status: DC | PRN
  Administered 2021-06-16 (×2): 1 mg via INTRAVENOUS

## 2021-06-16 MED ORDER — LACTATED RINGERS IV SOLN: INTRAVENOUS | Status: DC | PRN

## 2021-06-16 MED ORDER — HEMOSTATIC AGENTS (NO CHARGE) OPTIME
TOPICAL | Status: DC | PRN
  Administered 2021-06-16 (×3): 1 via TOPICAL

## 2021-06-16 MED ORDER — CEFAZOLIN IN SODIUM CHLORIDE 3-0.9 GM/100ML-% IV SOLN
3.0000 g | Freq: Three times a day (TID) | INTRAVENOUS | Status: AC
  Administered 2021-06-16 – 2021-06-17 (×2): 3 g via INTRAVENOUS
  Filled 2021-06-16 (×2): qty 100

## 2021-06-16 MED ORDER — CALCIUM CHLORIDE 10 % IV SOLN
INTRAVENOUS | Status: DC | PRN
  Administered 2021-06-16 (×2): .5 g via INTRAVENOUS

## 2021-06-16 MED ORDER — DEXTROSE 5 % IV SOLN
INTRAVENOUS | Status: DC | PRN
  Administered 2021-06-16: 3 g via INTRAVENOUS

## 2021-06-16 MED ORDER — FENTANYL CITRATE (PF) 100 MCG/2ML IJ SOLN: 25.0000 ug | INTRAMUSCULAR | Status: DC | PRN

## 2021-06-16 MED ORDER — PROPOFOL 10 MG/ML IV BOLUS
INTRAVENOUS | Status: DC | PRN
  Administered 2021-06-16: 100 mg via INTRAVENOUS

## 2021-06-16 MED ORDER — CELECOXIB 200 MG PO CAPS
200.0000 mg | ORAL_CAPSULE | Freq: Two times a day (BID) | ORAL | Status: DC
  Administered 2021-06-16 – 2021-06-18 (×5): 200 mg via ORAL
  Filled 2021-06-16 (×5): qty 1

## 2021-06-16 MED ORDER — PHENYLEPHRINE 80 MCG/ML (10ML) SYRINGE FOR IV PUSH (FOR BLOOD PRESSURE SUPPORT)
PREFILLED_SYRINGE | INTRAVENOUS | Status: DC | PRN
  Administered 2021-06-16: 80 ug via INTRAVENOUS
  Administered 2021-06-16: 160 ug via INTRAVENOUS

## 2021-06-16 SURGICAL SUPPLY — 41 items
ADH SKN CLS APL DERMABOND .7 (GAUZE/BANDAGES/DRESSINGS) ×1
APL PRP STRL LF DISP 70% ISPRP (MISCELLANEOUS) ×1
APL SKNCLS STERI-STRIP NONHPOA (GAUZE/BANDAGES/DRESSINGS) ×1
BENZOIN TINCTURE PRP APPL 2/3 (GAUZE/BANDAGES/DRESSINGS) ×1 IMPLANT
BLADE SURG 15 STRL LF DISP TIS (BLADE) IMPLANT
BLADE SURG 15 STRL SS (BLADE) ×2
BNDG GAUZE DERMACEA FLUFF (GAUZE/BANDAGES/DRESSINGS) ×1
BNDG GAUZE DERMACEA FLUFF 4 (GAUZE/BANDAGES/DRESSINGS) IMPLANT
BNDG GZE DERMACEA 4 6PLY (GAUZE/BANDAGES/DRESSINGS) ×1
BULB RESERV EVAC DRAIN JP 100C (MISCELLANEOUS) ×1 IMPLANT
CHLORAPREP W/TINT 26 (MISCELLANEOUS) ×1 IMPLANT
DERMABOND ADVANCED (GAUZE/BANDAGES/DRESSINGS) ×1
DERMABOND ADVANCED .7 DNX12 (GAUZE/BANDAGES/DRESSINGS) IMPLANT
DRAPE LAPAROTOMY TRNSV 106X77 (MISCELLANEOUS) ×1 IMPLANT
DRSG TELFA 4X3 1S NADH ST (GAUZE/BANDAGES/DRESSINGS) ×1 IMPLANT
ELECT CAUTERY BLADE TIP 2.5 (TIP) ×2
ELECT REM PT RETURN 9FT ADLT (ELECTROSURGICAL) ×2
ELECTRODE CAUTERY BLDE TIP 2.5 (TIP) IMPLANT
ELECTRODE REM PT RTRN 9FT ADLT (ELECTROSURGICAL) IMPLANT
GAUZE 4X4 16PLY ~~LOC~~+RFID DBL (SPONGE) ×1 IMPLANT
GLOVE ORTHO TXT STRL SZ7.5 (GLOVE) ×1 IMPLANT
GOWN STRL REUS W/ TWL LRG LVL3 (GOWN DISPOSABLE) IMPLANT
GOWN STRL REUS W/ TWL XL LVL3 (GOWN DISPOSABLE) IMPLANT
GOWN STRL REUS W/TWL LRG LVL3 (GOWN DISPOSABLE) ×2
GOWN STRL REUS W/TWL XL LVL3 (GOWN DISPOSABLE) ×2
HANDLE YANKAUER SUCT BULB TIP (MISCELLANEOUS) ×1 IMPLANT
HEMOSTAT ARISTA ABSORB 1G (HEMOSTASIS) ×3 IMPLANT
KIT TURNOVER KIT A (KITS) ×1 IMPLANT
MANIFOLD NEPTUNE II (INSTRUMENTS) ×1 IMPLANT
NEEDLE HYPO 22GX1.5 SAFETY (NEEDLE) ×1 IMPLANT
PACK BASIN MINOR ARMC (MISCELLANEOUS) ×1 IMPLANT
PAD ABD DERMACEA PRESS 5X9 (GAUZE/BANDAGES/DRESSINGS) ×2 IMPLANT
PULSAVAC PLUS IRRIG FAN TIP (DISPOSABLE) ×2
SPONGE T-LAP 18X18 ~~LOC~~+RFID (SPONGE) ×2 IMPLANT
STRIP CLOSURE SKIN 1/2X4 (GAUZE/BANDAGES/DRESSINGS) ×1 IMPLANT
SUT DVC VLOC 3-0 CL 6 P-12 (SUTURE) ×1 IMPLANT
SUT VIC AB 3-0 SH 27 (SUTURE) ×2
SUT VIC AB 3-0 SH 27X BRD (SUTURE) IMPLANT
SYR 10ML LL (SYRINGE) ×1 IMPLANT
TIP FAN IRRIG PULSAVAC PLUS (DISPOSABLE) IMPLANT
TUBING CONNECTING 10 (TUBING) ×1 IMPLANT

## 2021-06-16 NOTE — Anesthesia Procedure Notes (Signed)
Procedure Name: Intubation Date/Time: 06/16/2021 11:57 AM  Performed by: Loletha Grayer, CRNAPre-anesthesia Checklist: Patient identified, Patient being monitored, Timeout performed, Emergency Drugs available and Suction available Patient Re-evaluated:Patient Re-evaluated prior to induction Oxygen Delivery Method: Circle system utilized Preoxygenation: Pre-oxygenation with 100% oxygen Induction Type: IV induction Ventilation: Mask ventilation without difficulty Laryngoscope Size: 3 and McGraph Grade View: Grade I Tube type: Oral Tube size: 7.0 mm Number of attempts: 1 Airway Equipment and Method: Stylet Placement Confirmation: ETT inserted through vocal cords under direct vision, positive ETCO2 and breath sounds checked- equal and bilateral Secured at: 21 cm Tube secured with: Tape Dental Injury: Teeth and Oropharynx as per pre-operative assessment

## 2021-06-16 NOTE — Anesthesia Preprocedure Evaluation (Addendum)
Anesthesia Evaluation  Patient identified by MRN, date of birth, ID band Patient awake    Reviewed: Allergy & Precautions, NPO status , Patient's Chart, lab work & pertinent test results  History of Anesthesia Complications (+) PONV and history of anesthetic complications  Airway Mallampati: I  TM Distance: >3 FB Neck ROM: full    Dental no notable dental hx.    Pulmonary former smoker,   Stop bang 4   Pulmonary exam normal        Cardiovascular Exercise Tolerance: Good Normal cardiovascular exam     Neuro/Psych peripheral neuropathy  Neuromuscular disease negative psych ROS   GI/Hepatic negative GI ROS, (+)     substance abuse  marijuana use,   Endo/Other  diabetes, Poorly Controlled, Type 2Morbid obesityA1c 7.7  Renal/GU      Musculoskeletal   Abdominal (+) + obese,   Peds  Hematology  (+) Blood dyscrasia, anemia ,   Anesthesia Other Findings Post-op Hematoma  Past Medical History: No date: DCIS (ductal carcinoma in situ) of breast No date: Diabetes mellitus without complication (HCC) No date: Family history of bladder cancer No date: Family history of uterine cancer No date: High cholesterol No date: Neuromuscular disorder ([place])     Comment:  peripheral neuropathy No date: PONV (postoperative nausea and vomiting) No date: Vaginal Pap smear, abnormal  Past Surgical History: 03/08/2021: BREAST BIOPSY; Right     Comment:  right breast bx coil clip -positive 03/24/2021: BREAST BIOPSY; Right     Comment:  stereo biopsy x clip/DUCTAL CARCINOMA IN SITU (DCIS), 05/18/2021: BREAST BIOPSY; Left     Comment:  ribbon clip 05/18/2021: BREAST BIOPSY; Right     Comment:  coil clip No date: CRYOTHERAPY No date: WRIST SURGERY  BMI    Body Mass Index: 57.57 kg/m      Reproductive/Obstetrics negative OB ROS                           Anesthesia Physical  Anesthesia Plan  ASA:  3  Anesthesia Plan: General ETT   Post-op Pain Management: Tylenol PO (pre-op)*   Induction: Intravenous  PONV Risk Score and Plan: 4 or greater and Ondansetron, Dexamethasone, Midazolam and Droperidol  Airway Management Planned: Oral ETT  Additional Equipment:   Intra-op Plan:   Post-operative Plan: Extubation in OR  Informed Consent: I have reviewed the patients History and Physical, chart, labs and discussed the procedure including the risks, benefits and alternatives for the proposed anesthesia with the patient or authorized representative who has indicated his/her understanding and acceptance.     Dental Advisory Given  Plan Discussed with: Anesthesiologist, CRNA and Surgeon  Anesthesia Plan Comments:        Anesthesia Quick Evaluation

## 2021-06-16 NOTE — Progress Notes (Signed)
Patient reported that her right hand appeared to be more swollen than her left. I did not notice any swelling, advise patient to let her physician know her concerns. No pain reported last night patient slept well and has ambulated in the room.

## 2021-06-16 NOTE — Op Note (Signed)
Evacuation right postmastectomy hematoma.  Pre-operative Diagnosis: Postmastectomy hematoma, right  Post-operative Diagnosis: same.    Surgeon: Ronny Bacon, M.D., [facility]  Anesthesia: General endotracheal  Findings: Approximate 400 mL of old clotted blood, without well-defined source of hematoma.  Estimated Blood Loss: 400 mL         Specimens: Large volume of clots, discarded.          Complications: none              Procedure Details  The benefits, complications, treatment options, and expected outcomes were discussed with the patient. The risks of bleeding, infection, recurrence of symptoms, failure to resolve symptoms, unanticipated injury, any of which could require further surgery were reviewed with the patient. The likelihood of improving the patient's symptoms with return to their baseline status is hopeful.  The patient and/or family concurred with the proposed plan, giving informed consent.  The patient was taken to Operating Room, identified and the procedure verified.    Prior to the induction of general anesthesia, antibiotic prophylaxis was administered. VTE prophylaxis was in place.  General anesthesia was then administered and tolerated well. After the induction, the patient was positioned in the supine position and the right chest wall was prepped with  Chloraprep and draped in the sterile fashion.  A Time Out was held and the above information confirmed. Prior staples were removed, the underlying running V-Loc suture also removed allowing spontaneous opening of the incision.  Large volume of dark clotted blood removed from under the flaps.  Sponging was utilized to try to evaluate source of bleeding.  Pulse lavage was utilized to cleanse the wound and hopefully reveal potential bleeders.  Although there was a diffuse dilute ooze, there is no definite venous or arterial bleeder to cauterize or ligate.  After multiple irrigations and evaluation.  It was determined best to  proceed with closure.  Prior to closure we sprayed 3 units of 1 g Arista. Incision was once again closed with a running 3-0 absorbable non dyed V lock suture reapproximating the dermis. Steri-Strips applied over benzoin. The drain was milked and ensured adequate functioning. Dressings reapplied.  We will continue her prophylactic IV antibiotics, monitor her drain output and provide pain control for another night of observation.    Ronny Bacon M.D., [facility] 06/16/2021 1:13 PM

## 2021-06-16 NOTE — Progress Notes (Signed)
[place] [facility] Day(s): 0.   Post op day(s): 1 Day Post-Op.   Interval History:  Patient seen and examined No acute events or new complaints overnight.  Patient reports this morning she is having right sided incisional chest pain and some dizziness after walking to the bathroom Intermittent nausea No fever, chills No new labs this morning Surgical drain output as follows;   - Right Breast: 35 ccs; serosanguinous   - Left Breast: 120 ccs; sanguinous   Vital signs in last 24 hours: [min-max] current  Temp:  [97.3 F (36.3 C)-98.7 F (37.1 C)] 97.9 F (36.6 C) (06/08 0753) Pulse Rate:  [60-99] 60 (06/08 0753) Resp:  [16-19] 18 (06/08 0753) BP: (81-148)/(62-95) 81/62 (06/08 0753) SpO2:  [93 %-98 %] 97 % (06/08 0753) Weight:  [147.4 kg] 147.4 kg (06/07 1306)     Height: '5\' 3"'$  (160 cm) Weight: (!) 147.4 kg BMI (Calculated): 57.59   Intake/Output last 2 shifts:  06/07 0701 - 06/08 0700 In: 980 [P.O.:180; I.V.:700; IV Piggyback:100] Out: 955 [Urine:750; Drains:155; Blood:50]   Physical Exam:  Constitutional: alert, cooperative and no distress  Respiratory: breathing non-labored at rest  Cardiovascular: regular rate and sinus rhythm  Integumentary: Chaperone present. Left mastectomy incision is CDI with staples, no erythema or ecchymosis, drain is serosanguinous. Right mastectomy incisions is intact with staples, more swollen, there is significant ecchymosis across entire incision, drain is more sanguinous than compared to the left. Breast binder in place.   Labs:     Latest Ref Rng & Units 06/09/2021    2:15 PM 07/30/2020    8:14 PM 05/05/2020    3:27 PM  CBC  WBC 4.0 - 10.5 K/uL 5.7  9.2  7.8   Hemoglobin 12.0 - 15.0 g/dL 13.4  14.3  14.6   Hematocrit 36.0 - 46.0 % 42.1  43.2  42.4   Platelets 150 - 400 K/uL 199  232  239       Latest Ref Rng & Units 06/09/2021    2:15 PM 07/30/2020    8:14 PM 05/05/2020    3:27 PM  CMP   Glucose 70 - 99 mg/dL 163  119  138   BUN 6 - 20 mg/dL '14  17  11   '$ Creatinine 0.44 - 1.00 mg/dL 0.87  0.85  0.95   Sodium 135 - 145 mmol/L 138  139  141   Potassium 3.5 - 5.1 mmol/L 4.1  3.7  4.3   Chloride 98 - 111 mmol/L 105  106  102   CO2 22 - 32 mmol/L '26  26  21   '$ Calcium 8.9 - 10.3 mg/dL 8.8  9.1  8.8   Total Protein 6.0 - 8.5 g/dL   6.7   Total Bilirubin 0.0 - 1.2 mg/dL   0.2   Alkaline Phos 44 - 121 IU/L   109   AST 0 - 40 IU/L   10   ALT 0 - 32 IU/L   14      Imaging studies: No new pertinent imaging studies   Assessment/Plan:  51 y.o. female with dizziness, nausea, and hypotension this morning 1 Day Post-Op s/p bilateral simple mastectomy with right axillary sentinel lymph node biopsy for DCIS of the right breast and abnormal MRI of the left breast.    - I do worry that she may be developing some degree of hematoma on the right given the pain, swelling, ecchymosis, and sanguinous nature of her drain output.  Her BP is soft this morning which is likely accounting for her dizziness.    - I will back her down to NPO for now as a precaution - Will restart IVF support - Check STAT CBC/BMP - I explained that sometimes we do have to return to the OR to control oozing/bleeding from wound bed. Will defer to Dr Christian Mate pending work=up/his evaluation  - Discontinue Toradol - Continue breast binder   - Pain control prn; antiemetics prn - Mobilization as tolerated   - Discharge Planning; Will not be ready for discharge today    All of the above findings and recommendations were discussed with the patient, and the medical team, and all of patient's questions were answered to her expressed satisfaction.  -- Edison Simon, PA-C [facility] 06/16/2021, 7:57 AM M-F: 7am - 4pm

## 2021-06-16 NOTE — Transfer of Care (Signed)
Immediate Anesthesia Transfer of Care Note  Patient: Heather Cain  Procedure(s) Performed: EVACUATION HEMATOMA BREAST (Right: Breast)  Patient Location: PACU  Anesthesia Type:General  Level of Consciousness: awake  Airway & Oxygen Therapy: Patient Spontanous Breathing and Patient connected to face mask oxygen  Post-op Assessment: Report given to RN and Post -op Vital signs reviewed and stable  Post vital signs: Reviewed and stable  Last Vitals:  Vitals Value Taken Time  BP 137/91 06/16/21 1321  Temp    Pulse 97 06/16/21 1325  Resp 21 06/16/21 1325  SpO2 100 % 06/16/21 1325  Vitals shown include unvalidated device data.  Last Pain:  Vitals:   06/16/21 1115  TempSrc:   PainSc: 0-No pain         Complications: No notable events documented.

## 2021-06-17 LAB — BASIC METABOLIC PANEL
Anion gap: 3 — ABNORMAL LOW (ref 5–15)
BUN: 15 mg/dL (ref 6–20)
CO2: 26 mmol/L (ref 22–32)
Calcium: 8.2 mg/dL — ABNORMAL LOW (ref 8.9–10.3)
Chloride: 110 mmol/L (ref 98–111)
Creatinine, Ser: 0.78 mg/dL (ref 0.44–1.00)
GFR, Estimated: >60 mL/min (ref >60)
Glucose, Bld: 154 mg/dL — ABNORMAL HIGH (ref 70–99)
Potassium: 4.3 mmol/L (ref 3.5–5.1)
Sodium: 139 mmol/L (ref 135–145)

## 2021-06-17 LAB — CBC
HCT: 26.5 % — ABNORMAL LOW (ref 36.0–46.0)
Hemoglobin: 8.4 g/dL — ABNORMAL LOW (ref 12.0–15.0)
MCH: 29.7 pg (ref 26.0–34.0)
MCHC: 31.7 g/dL (ref 30.0–36.0)
MCV: 93.6 fL (ref 80.0–100.0)
Platelets: 172 10*3/uL (ref 150–400)
RBC: 2.83 MIL/uL — ABNORMAL LOW (ref 3.87–5.11)
RDW: 15.7 % — ABNORMAL HIGH (ref 11.5–15.5)
WBC: 8.7 10*3/uL (ref 4.0–10.5)
nRBC: 0 % (ref 0.0–0.2)

## 2021-06-17 LAB — GLUCOSE, CAPILLARY
Glucose-Capillary: 161 mg/dL — ABNORMAL HIGH (ref 70–99)
Glucose-Capillary: 142 mg/dL — ABNORMAL HIGH (ref 70–99)
Glucose-Capillary: 132 mg/dL — ABNORMAL HIGH (ref 70–99)
Glucose-Capillary: 203 mg/dL — ABNORMAL HIGH (ref 70–99)

## 2021-06-17 MED ORDER — OXYCODONE HCL 5 MG PO TABS: 5.0000 mg | ORAL_TABLET | ORAL | 0 refills | Status: DC | PRN

## 2021-06-17 MED ORDER — POLYETHYLENE GLYCOL 3350 17 G PO PACK
17.0000 g | PACK | Freq: Every day | ORAL | Status: DC | PRN
Start: 2021-06-17 — End: 2021-06-18
  Administered 2021-06-17 – 2021-06-18 (×2): 17 g via ORAL
  Filled 2021-06-17 (×2): qty 1

## 2021-06-17 MED ORDER — DOCUSATE SODIUM 100 MG PO CAPS
100.0000 mg | ORAL_CAPSULE | Freq: Two times a day (BID) | ORAL | Status: DC
  Administered 2021-06-17 – 2021-06-18 (×2): 100 mg via ORAL
  Filled 2021-06-17 (×2): qty 1

## 2021-06-17 NOTE — Progress Notes (Signed)
[facility] Day(s): 1.   Post op day(s): 1 Day Post-Op.   Interval History:  Patient seen and examined No acute events or new complaints overnight.  Patient reports she is feeling much better Still sore in right chest Previous dizziness resolved No fever, chills  Previously seen leukocytosis now resolved; WBC to 8.7K Hgb to 8.4 ( from 13.4 --> 9.9 --> 8.4) Renal function normal; sCr - 0.78; UO - 600 ccs No electrolyte derangements Surgical drain output as follows;   - Right Breast: 95 ccs; serosanguinous   - Left Breast: 395 ccs; sanguinous She is back on carb modified diet   Vital signs in last 24 hours: [min-max] current  Temp:  [97.4 F (36.3 C)-98.2 F (36.8 C)] 98.2 F (36.8 C) (06/09 0507) Pulse Rate:  [60-105] 90 (06/09 0507) Resp:  [16-19] 16 (06/09 0507) BP: (81-137)/(60-91) 105/63 (06/09 0507) SpO2:  [95 %-100 %] 99 % (06/09 0507)     Height: '5\' 3"'$  (160 cm) Weight: (!) 147.4 kg BMI (Calculated): 57.59   Intake/Output last 2 shifts:  06/08 0701 - 06/09 0700 In: 1678 [P.O.:118; I.V.:1160; IV Piggyback:400] Out: 1490 [Urine:600; Drains:490; Blood:400]   Physical Exam:  Constitutional: alert, cooperative and no distress  Respiratory: breathing non-labored at rest  Cardiovascular: regular rate and sinus rhythm  Integumentary: Chaperone present. Left mastectomy incision is CDI with staples, no erythema or ecchymosis, drain is serosanguinous. Right mastectomy incisions is intact with steri-strips. Still ecchymotic but swelling resolved; drain serosanguinous. Breast binder in place.   Labs:     Latest Ref Rng & Units 06/17/2021    4:47 AM 06/16/2021   10:12 AM 06/09/2021    2:15 PM  CBC  WBC 4.0 - 10.5 K/uL 8.7  12.5  5.7   Hemoglobin 12.0 - 15.0 g/dL 8.4  9.9  13.4   Hematocrit 36.0 - 46.0 % 26.5  31.1  42.1   Platelets 150 - 400 K/uL 172  173  199       Latest Ref Rng & Units 06/17/2021    4:47 AM 06/16/2021    10:12 AM 06/09/2021    2:15 PM  CMP  Glucose 70 - 99 mg/dL 154  189  163   BUN 6 - 20 mg/dL '15  19  14   '$ Creatinine 0.44 - 1.00 mg/dL 0.78  0.88  0.87   Sodium 135 - 145 mmol/L 139  137  138   Potassium 3.5 - 5.1 mmol/L 4.3  4.2  4.1   Chloride 98 - 111 mmol/L 110  107  105   CO2 22 - 32 mmol/L '26  23  26   '$ Calcium 8.9 - 10.3 mg/dL 8.2  8.2  8.8      Imaging studies: No new pertinent imaging studies   Assessment/Plan:  51 y.o. female 1 Day s/p takeback for evacuation of right post-mastectomy hematoma 2 days s/p bilateral simple mastectomy with right axillary sentinel lymph node biopsy for DCIS of the right breast and abnormal MRI of the left breast.    - Monitor H&H; suspect she is equilibrating/dilutional; will check in AM  - Continue regular diet  - Discontinue IVF today - Continue breast binder   - Pain control prn; antiemetics prn - Mobilization as tolerated  - Hold DVT prophylaxis in setting of post-operative hematoma  - Discharge Planning; Will keep today to ensure H&H stabilizes; if stable/improved tomorrow (06/10), we can get her home tomorrow. I will prep her DC  today  All of the above findings and recommendations were discussed with the patient, and the medical team, and all of patient's questions were answered to her expressed satisfaction.  -- Edison Simon, PA-C  [facility] 06/17/2021, 7:26 AM M-F: 7am - 4pm

## 2021-06-17 NOTE — Plan of Care (Signed)

## 2021-06-17 NOTE — Progress Notes (Signed)
Mobility Specialist - Progress Note   06/17/21 1500  Mobility  Activity Ambulated with assistance in room;Ambulated with assistance to bathroom  Level of Assistance Standby assist, set-up cues, supervision of patient - no hands on  Assistive Device None  Distance Ambulated (ft) 20 ft  Activity Response Tolerated well  $Mobility charge 1 Mobility     Pt ambulated to bathroom with supervision. HHA to sit EOB with HOB elevated. No assist to stand, but assist for peri-care. Pt returned to EOB where, she was left per pt request, with needs in reach.    Kathee Delton Mobility Specialist 06/17/21, 3:53 PM

## 2021-06-17 NOTE — Discharge Instructions (Signed)
In addition to included general post-operative instructions,  Diet: Resume home diet.   Activity: No heavy lifting >20 pounds (children, pets, laundry, garbage) until cleared by surgeon, but light activity and walking are encouraged. Do not drive or drink alcohol if taking narcotic pain medications or having pain that might distract from driving.  Wound care: If you can keep drain sites covered, you may shower/get incision wet with soapy water and pat dry (do not rub incisions), but no baths or submerging incision underwater until follow-up. Continue breast binder for comfort and compression.   Medications: Resume all home medications. For mild to moderate pain: acetaminophen (Tylenol) or ibuprofen/naproxen (if no kidney disease). Combining Tylenol with alcohol can substantially increase your risk of causing liver disease. Narcotic pain medications, if prescribed, can be used for severe pain, though may cause nausea, constipation, and drowsiness. Do not combine Tylenol and Percocet (or similar) within a 6 hour period as Percocet (and similar) contain(s) Tylenol. If you do not need the narcotic pain medication, you do not need to fill the prescription.  Call office [phone] / [phone]) at any time if any questions, worsening pain, fevers/chills, bleeding, drainage from incision site, or other concerns.

## 2021-06-18 LAB — CBC
HCT: 26.2 % — ABNORMAL LOW (ref 36.0–46.0)
Hemoglobin: 8.2 g/dL — ABNORMAL LOW (ref 12.0–15.0)
MCH: 29.5 pg (ref 26.0–34.0)
MCHC: 31.3 g/dL (ref 30.0–36.0)
MCV: 94.2 fL (ref 80.0–100.0)
Platelets: 156 10*3/uL (ref 150–400)
RBC: 2.78 MIL/uL — ABNORMAL LOW (ref 3.87–5.11)
RDW: 15.7 % — ABNORMAL HIGH (ref 11.5–15.5)
WBC: 8.8 10*3/uL (ref 4.0–10.5)
nRBC: 0.2 % (ref 0.0–0.2)

## 2021-06-18 LAB — GLUCOSE, CAPILLARY
Glucose-Capillary: 124 mg/dL — ABNORMAL HIGH (ref 70–99)
Glucose-Capillary: 140 mg/dL — ABNORMAL HIGH (ref 70–99)
Glucose-Capillary: 145 mg/dL — ABNORMAL HIGH (ref 70–99)

## 2021-06-18 LAB — BASIC METABOLIC PANEL
Anion gap: 4 — ABNORMAL LOW (ref 5–15)
BUN: 14 mg/dL (ref 6–20)
CO2: 25 mmol/L (ref 22–32)
Calcium: 8.2 mg/dL — ABNORMAL LOW (ref 8.9–10.3)
Chloride: 108 mmol/L (ref 98–111)
Creatinine, Ser: 0.9 mg/dL (ref 0.44–1.00)
GFR, Estimated: >60 mL/min (ref >60)
Glucose, Bld: 116 mg/dL — ABNORMAL HIGH (ref 70–99)
Potassium: 4.3 mmol/L (ref 3.5–5.1)
Sodium: 137 mmol/L (ref 135–145)

## 2021-06-18 NOTE — Anesthesia Postprocedure Evaluation (Signed)
Anesthesia Post Note  Patient: [name]  Procedure(s) Performed: EVACUATION HEMATOMA BREAST (Right: Breast)  Patient location during evaluation: PACU Anesthesia Type: General Level of consciousness: awake and alert Pain management: pain level controlled Vital Signs Assessment: post-procedure vital signs reviewed and stable Respiratory status: spontaneous breathing, nonlabored ventilation and respiratory function stable Cardiovascular status: blood pressure returned to baseline and stable Postop Assessment: no apparent nausea or vomiting Anesthetic complications: no   No notable events documented.   Last Vitals:  Vitals:   06/18/21 0849 06/18/21 1532  BP: 119/77 101/69  Pulse: 85 81  Resp: 18 18  Temp: 36.5 C 36.8 C  SpO2: 94% 95%    Last Pain:  Vitals:   06/18/21 1532  TempSrc: Oral  PainSc:                  [place] Ouch

## 2021-06-19 ENCOUNTER — Encounter: Payer: Self-pay | Admitting: Surgery

## 2021-06-19 NOTE — Discharge Summary (Signed)
Patient ID: Heather Cain MRN: [id number] DOB/AGE: Jun 16, 1970 51 y.o.  Admit date: 06/15/2021 Discharge date: 06/19/2021   Discharge Diagnoses:  Principal Problem:   Ductal carcinoma in situ (DCIS) of right breast   Procedures: Bilateral mastectomies with sentinel lymph node biopsy on the right  Hospital Course: . 51 year old female with recent diagnosed DCIS underwent bilateral simple mastectomy and sentinel lymph node biopsy on the right.  She did well in the immediate postoperative course however next day developed a hematoma on the right side that needed evacuation.  She was taken again back to the OR for evacuation of hematoma.  There was no obvious point of bleeding. She was kept 2 more nights after that to make sure that she was not having any issues. At  The time of discharge the patient was ambulating,  pain was controlled.  Her vital signs were stable and she was afebrile.   physical exam at discharge showed a pt  in no acute distress.  Awake and alert.  Chest incisions healing well without infection. Drain w serosanguineous drainage. Flaps were well perfused.  Condition of the patient the time of discharge was stable   Disposition: Discharge disposition: 01-Home or Self Care       Discharge Instructions     Call MD for:  difficulty breathing, headache or visual disturbances   Complete by: As directed    Call MD for:  extreme fatigue   Complete by: As directed    Call MD for:  hives   Complete by: As directed    Call MD for:  persistant dizziness or light-headedness   Complete by: As directed    Call MD for:  persistant nausea and vomiting   Complete by: As directed    Call MD for:  redness, tenderness, or signs of infection (pain, swelling, redness, odor or green/yellow discharge around incision site)   Complete by: As directed    Call MD for:  severe uncontrolled pain   Complete by: As directed    Call MD for:  temperature >100.4   Complete by: As directed     Diet - low sodium heart healthy   Complete by: As directed    Discharge instructions   Complete by: As directed    May change dressing prn w gauze or fluffs   Increase activity slowly   Complete by: As directed       Allergies as of 06/18/2021       Reactions   Codeine Itching, Anaphylaxis        Medication List     STOP taking these medications    nicotine 21 mg/24hr patch Commonly known as: Nicoderm CQ       TAKE these medications    Accu-Chek Guide test strip Generic drug: glucose blood USE 1 STRIP TO CHECK GLUCOSE ONCE DAILY   Accu-Chek Softclix Lancets lancets USE 1  TO CHECK GLUCOSE ONCE DAILY   albuterol 108 (90 Base) MCG/ACT inhaler Commonly known as: VENTOLIN HFA Inhale 2 puffs into the lungs every 6 (six) hours as needed for wheezing or shortness of breath.   B-12 [phone] MCG Subl daily.   fluticasone 50 MCG/ACT nasal spray Commonly known as: FLONASE Place into both nostrils daily.   furosemide 20 MG tablet Commonly known as: LASIX Take 1 tablet by mouth once daily What changed:  how much to take how to take this when to take this additional instructions   gabapentin 100 MG capsule Commonly known as: NEURONTIN TAKE  1 CAPSULE BY MOUTH TWICE DAILY FOR 7 DAYS THEN INCREASE TO 2 CAPS TWICE DAILY AND CONTINUE   glimepiride 2 MG tablet Commonly known as: AMARYL TAKE 1 TABLET BY MOUTH ONCE DAILY BEFORE BREAKFAST   loratadine 10 MG tablet Commonly known as: CLARITIN Take 10 mg by mouth daily.   oxyCODONE 5 MG immediate release tablet Commonly known as: Roxicodone Take 1 tablet (5 mg total) by mouth every 4 (four) hours as needed for severe pain.   rosuvastatin 20 MG tablet Commonly known as: CRESTOR Take 1 tablet by mouth once daily        Follow-up Information     Ronny Bacon, MD. Schedule an appointment as soon as possible for a visit in 1 week(s).   Specialty: General Surgery Why: s/p bilateral mastectomy and takeback  for hematoma evacuation, has drains x2 Contact information: [address] [place] [address] [phone]                  Caroleen Hamman, MD FACS

## 2021-06-21 ENCOUNTER — Inpatient Hospital Stay (HOSPITAL_BASED_OUTPATIENT_CLINIC_OR_DEPARTMENT_OTHER): Payer: 59 | Admitting: Internal Medicine

## 2021-06-21 ENCOUNTER — Other Ambulatory Visit: Payer: Self-pay | Admitting: Anatomic Pathology & Clinical Pathology

## 2021-06-21 ENCOUNTER — Encounter: Payer: Self-pay | Admitting: Internal Medicine

## 2021-06-21 DIAGNOSIS — Z79899 Other long term (current) drug therapy: Secondary | ICD-10-CM | POA: Diagnosis not present

## 2021-06-21 DIAGNOSIS — Z9013 Acquired absence of bilateral breasts and nipples: Secondary | ICD-10-CM | POA: Diagnosis not present

## 2021-06-21 DIAGNOSIS — D0511 Intraductal carcinoma in situ of right breast: Secondary | ICD-10-CM | POA: Diagnosis not present

## 2021-06-21 DIAGNOSIS — Z170 Estrogen receptor positive status [ER+]: Secondary | ICD-10-CM | POA: Diagnosis not present

## 2021-06-21 NOTE — Assessment & Plan Note (Addendum)
#   Right breast multifocal - DCIS high-grade; with comedonecrosis x3.Left breast status postbiopsy-ductal fibrosis.  No concern for malignancy.   #  JUNE 2023- Status post mastectomy 5 mm residual DCIS intermediate.  ER positive.  Left breast-no malignancy noted.  Given Bilateral mastectomies with DICS on right side; without any evidence of invasive disease- she will not need any further endocrine therapy.    No role for any adjuvant therapy.   # Genetics:  Negative for any deleterious mutation.  # DM [hemoglobin A1c] around 6 as per patient.  Monitor closely.   # Active smoker: counseled to quit smoking.  #Anxiety: Status post social worker referral.  Stable.  I reviewed the pathology/plan of care with the patient and her mother in detail.  No further follow-ups are recommended at the cancer center.  She will continue follow-up with Dr. Christian Mate her surgeon for follow-up of her postmastectomy visits.  DISPOSITION: # follow up as needed-Dr.B  Cc; Dr.Rodenberg.

## 2021-06-21 NOTE — Progress Notes (Unsigned)
[place] OFFICE PROGRESS NOTE  Patient Care Team: Cletis Athens, MD as PCP - General (Internal Medicine) Theodore Demark, RN (Inactive) as Oncology Nurse Navigator Ronny Bacon, MD as Consulting Physician (General Surgery) Cammie Sickle, MD as Consulting Physician (Oncology)   Cancer Staging  Ductal carcinoma in situ (DCIS) of right breast Staging form: Breast, AJCC 8th Edition - Clinical: cTis (DCIS) - Signed by Cammie Sickle, MD on 03/15/2021    Oncology History Overview Note  MAY 2022- [screening]-  # FEB 2023- 2. Stable probably benign calcifications in the upper central and upper outer right breast.   RECOMMENDATION: Stereotactic core needle biopsy of the right breast x 1.DIAGNOSIS:   FEB 28th, 2023-  A. BREAST CALCIFICATIONS, RIGHT LOWER OUTER; STEREOTACTIC BIOPSY:  - DUCTAL CARCINOMA IN SITU (DCIS), HIGH-GRADE, COMEDO-TYPE AND WITH  ASSOCIATED CALCIFICATIONS.  - DCIS IS PRESENT IN 5 OF 6 BLOCKS.   # MARCH 16th, 2023- DIAGNOSIS:  A. BREAST, RIGHT, UPPER OUTER QUADRANT; STEREOTACTIC-GUIDED CORE BIOPSY:  - DUCTAL CARCINOMA IN SITU (DCIS), INTERMEDIATE NUCLEAR GRADE WITHOUT  NECROSIS, SEE COMMENT.   MAY 10th.  2023  B. BREAST, RIGHT, UPPER OUTER QUADRANT POSTERIOR DEPTH (RIBBON CLIP);  STEREOTACTIC-GUIDED CORE BIOPSY:  - DUCTAL CARCINOMA IN SITU (DCIS), INTERMEDIATE NUCLEAR GRADE WITH FOCAL  NECROSIS, ASSOCIATED WITH CALCIFICATIONS.  - DCIS IS PRESENT IN 4 OF 6 BLOCKS, MEASURING UP TO 2 MM.   LEFT BREAST DUCTAL HyperplasiaSURGICAL PATHOLOGY  CASE: [id number]  PATIENT: Heather Cain  Surgical Pathology Report      Specimen Submitted:  A. Breast, left  B. Breast, right  C. Sentinel lymph node, right axilla   Clinical History: DCIS right     DIAGNOSIS:  A. BREAST, LEFT; SIMPLE MASTECTOMY:  - BENIGN MAMMARY PARENCHYMA WITH FIBROCYSTIC AND APOCRINE CHANGES.  - BENIGN NIPPLE/AREOLA.  - CLIP AND BIOPSY SITE IDENTIFIED.   - NO EVIDENCE OF ATYPICAL PROLIFERATIVE BREAST DISEASE.   B. BREAST, RIGHT; SIMPLE MASTECTOMY:  - FOCAL RESIDUAL DUCTAL CARCINOMA IN SITU, INTERMEDIATE GRADE, WITH  ASSOCIATED CALCIFICATIONS.  - BENIGN NIPPLE/AREOLA.  - BACKGROUND EXTENSIVE FIBROCYSTIC AND APOCRINE CHANGES, AND FLAT  EPITHELIAL ATYPIA, WITH USUAL DUCTAL HYPERPLASIA AND ASSOCIATED  CALCIFICATIONS.  - CLIPS X3 AND BIOPSY SITES X3 IDENTIFIED.  - NEGATIVE FOR INVASIVE CARCINOMA.   C. LYMPH NODE, RIGHT AXILLARY SENTINEL; EXCISION:  - ONE LYMPH NODE, NEGATIVE FOR MALIGNANCY (0/1).   Comment:  This case is reviewed together with the prior diagnostic biopsies  ([id number], [id number], and [id number]).  Although all 3 original  biopsy sites are identified histologically, only a few small foci of  residual ductal carcinoma in situ are present, associated with the  ribbon and coil-shaped clips.  Additional tissue surrounding all 3 prior  biopsy sites was submitted following initial histologic evaluation.  Calcifications are seen associated with both DCIS, and benign  fibrocystic changes/flat epithelial atypia.   CANCER CASE SUMMARY: DUCTAL CARCINOMA IN SITU OF THE BREAST  Standard(s): AJCC-UICC 8   SPECIMEN  Procedure: Total mastectomy  Specimen Laterality: Right   TUMOR  Histologic Type: Ductal carcinoma in situ  Size (Extent) of DCIS:  at least 5 mm  Nuclear Grade: Grade II (intermediate)  Necrosis: Not identified   MARGINS  Margin Status: All margins negative for DCIS       Distance from DCIS to closest margin: Greater than 5 mm       Specify closest margin: All surgical margins   REGIONAL LYMPH NODES  Regional Lymph node Status: All regional  lymph nodes negative for tumor       Total Number of Lymph Nodes Examined (sentinel and non-sentinel): 1        Number of Sentinel Nodes Examined: 1   DISTANT METASTASIS  Distant Site(s) Involved, if applicable (select all that apply): Not  applicable    PATHOLOGIC STAGE CLASSIFICATION (pTNM, AJCC 8th Edition)  TNM Descriptors: Not applicable  pTis (DCIS)  Regional Lymph Nodes Modifier: sn  pN0  pM - Not applicable   #  JUNE 4193- Status post mastectomy 5 mm residual DCIS intermediate.  ER positive.  Left breast-no malignancy noted.  Given Bilateral mastectomies with DICS on right side; without any evidence of invasive disease- she will not need any further endocrine therapy.    No role for any adjuvant therapy.   Ductal carcinoma in situ (DCIS) of right breast  03/15/2021 Initial Diagnosis   Ductal carcinoma in situ (DCIS) of right breast   03/15/2021 Cancer Staging   Staging form: Breast, AJCC 8th Edition - Clinical: cTis (DCIS) - Signed by Cammie Sickle, MD on 03/15/2021    Genetic Testing   Negative genetic testing. No pathogenic variants identified on the [facility] CancerNext-Expanded+RNA panel. The report date is 05/12/2021.  The CancerNext-Expanded + RNAinsight gene panel offered by [employer] and includes sequencing and rearrangement analysis for the following 77 genes: IP, ALK, APC*, ATM*, AXIN2, BAP1, BARD1, BLM, BMPR1A, BRCA1*, BRCA2*, BRIP1*, CDC73, CDH1*,CDK4, CDKN1B, CDKN2A, CHEK2*, CTNNA1, DICER1, FANCC, FH, FLCN, GALNT12, KIF1B, LZTR1, MAX, MEN1, MET, MLH1*, MSH2*, MSH3, MSH6*, MUTYH*, NBN, NF1*, NF2, NTHL1, PALB2*, PHOX2B, PMS2*, POT1, PRKAR1A, PTCH1, PTEN*, RAD51C*, RAD51D*,RB1, RECQL, RET, SDHA, SDHAF2, SDHB, SDHC, SDHD, SMAD4, SMARCA4, SMARCB1, SMARCE1, STK11, SUFU, TMEM127, TP53*,TSC1, TSC2, VHL and XRCC2 (sequencing and deletion/duplication); EGFR, EGLN1, HOXB13, KIT, MITF, PDGFRA, POLD1 and POLE (sequencing only); EPCAM and GREM1 (deletion/duplication only).     INTERVAL HISTORY: Obese.  Accompanied by her mother.  Mbulating independently.  Heather Cain 51 y.o.  female with a recent diagnosis right breast DICS is here to review the results of her surgery/pathology.  Patient underwent right mastectomy; and  also left prophylactic mastectomy.   Patient is healing well from surgery.  She still has her drains intact.  Awaiting evaluation with surgery  Review of Systems  Constitutional:  Negative for chills, diaphoresis, fever, malaise/fatigue and weight loss.  HENT:  Negative for nosebleeds and sore throat.   Eyes:  Negative for double vision.  Respiratory:  Negative for cough, hemoptysis, sputum production, shortness of breath and wheezing.   Cardiovascular:  Negative for chest pain, palpitations, orthopnea and leg swelling.  Gastrointestinal:  Negative for abdominal pain, blood in stool, constipation, diarrhea, heartburn, melena, nausea and vomiting.  Genitourinary:  Negative for dysuria, frequency and urgency.  Musculoskeletal:  Negative for back pain and joint pain.  Skin: Negative.  Negative for itching and rash.  Neurological:  Negative for dizziness, tingling, focal weakness, weakness and headaches.  Endo/Heme/Allergies:  Does not bruise/bleed easily.  Psychiatric/Behavioral:  Negative for depression. The patient is not nervous/anxious and does not have insomnia.       PAST MEDICAL HISTORY :  Past Medical History:  Diagnosis Date   DCIS (ductal carcinoma in situ) of breast    Diabetes mellitus without complication (HCC)    Family history of bladder cancer    Family history of uterine cancer    High cholesterol    Neuromuscular disorder ([place])    peripheral neuropathy   PONV (postoperative nausea and vomiting)  Vaginal Pap smear, abnormal     PAST SURGICAL HISTORY :   Past Surgical History:  Procedure Laterality Date   BREAST BIOPSY Right 03/08/2021   right breast bx coil clip -positive   BREAST BIOPSY Right 03/24/2021   stereo biopsy x clip/DUCTAL CARCINOMA IN SITU (DCIS),   BREAST BIOPSY Left 05/18/2021   ribbon clip   BREAST BIOPSY Right 05/18/2021   coil clip   CRYOTHERAPY     EVACUATION BREAST HEMATOMA Right 06/16/2021   Procedure: EVACUATION HEMATOMA BREAST;   Surgeon: Ronny Bacon, MD;  Location: [facility] ORS;  Service: General;  Laterality: Right;   SIMPLE MASTECTOMY WITH AXILLARY SENTINEL NODE BIOPSY Right 06/15/2021   Procedure: SIMPLE MASTECTOMY WITH AXILLARY SENTINEL NODE BIOPSY, Total;  Surgeon: Ronny Bacon, MD;  Location: [facility] OR[facility];  Service: General;  Laterality: Right;   TOTAL MASTECTOMY Left 06/15/2021   Procedure: TOTAL MASTECTOMY, prophylactic left;  Surgeon: Ronny Bacon, MD;  Location: [facility] OR[facility];  Service: General;  Laterality: Left;   WRIST SURGERY      FAMILY HISTORY :   Family History  Problem Relation Age of Onset   Uterine cancer Mother 53   Bladder Cancer Father 5   Skin cancer Maternal Aunt    Cancer - Other Maternal Grandfather        possible colon cancer   Breast cancer Maternal Great-grandmother     SOCIAL HISTORY:   Social History   Tobacco Use   Smoking status: Former    Packs/day: 1.00    Types: Cigarettes    Quit date: 05/30/2021    Years since quitting: 0.0   Smokeless tobacco: Never  Vaping Use   Vaping Use: Every day  Substance Use Topics   Alcohol use: Yes    Comment: social   Drug use: Yes    Types: Marijuana    ALLERGIES:  is allergic to codeine.  MEDICATIONS:  Current Outpatient Medications  Medication Sig Dispense Refill   Accu-Chek Softclix Lancets lancets USE 1  TO CHECK GLUCOSE ONCE DAILY 200 each 3   albuterol (VENTOLIN HFA) 108 (90 Base) MCG/ACT inhaler Inhale 2 puffs into the lungs every 6 (six) hours as needed for wheezing or shortness of breath. 8 g 0   fluticasone (FLONASE) 50 MCG/ACT nasal spray Place into both nostrils daily.     furosemide (LASIX) 20 MG tablet Take 1 tablet by mouth once daily (Patient taking differently: every other day. Monday, Wednesday, Friday) 30 tablet 0   gabapentin (NEURONTIN) 100 MG capsule TAKE 1 CAPSULE BY MOUTH TWICE DAILY FOR 7 DAYS THEN INCREASE TO 2 CAPS TWICE DAILY AND CONTINUE     glimepiride (AMARYL) 2 MG tablet TAKE 1 TABLET BY MOUTH  ONCE DAILY BEFORE BREAKFAST 30 tablet 0   glucose blood (ACCU-CHEK GUIDE) test strip USE 1 STRIP TO CHECK GLUCOSE ONCE DAILY 200 each 3   loratadine (CLARITIN) 10 MG tablet Take 10 mg by mouth daily.     oxyCODONE (ROXICODONE) 5 MG immediate release tablet Take 1 tablet (5 mg total) by mouth every 4 (four) hours as needed for severe pain. 30 tablet 0   rosuvastatin (CRESTOR) 20 MG tablet Take 1 tablet by mouth once daily 30 tablet 0   Cobalamin Combinations (B-12) [id number] MCG SUBL daily. (Patient not taking: Reported on 06/21/2021)     No current facility-administered medications for this visit.    PHYSICAL EXAMINATION: ECOG PERFORMANCE STATUS: 0 - Asymptomatic  BP 131/75   Pulse 92   Temp 98.7 F (  37.1 C)   Resp 19   Wt (!) 339 lb (153.8 kg)   LMP 05/22/2021   SpO2 96%   BMI 60.05 kg/m   Filed Weights   06/21/21 1437  Weight: (!) 339 lb (153.8 kg)   Patient has a binder in place at the mastectomy site.;  Drains in place.  Physical Exam Vitals and nursing note reviewed.  HENT:     Head: Normocephalic and atraumatic.     Mouth/Throat:     Pharynx: Oropharynx is clear.  Eyes:     Extraocular Movements: Extraocular movements intact.     Pupils: Pupils are equal, round, and reactive to light.  Cardiovascular:     Rate and Rhythm: Normal rate and regular rhythm.  Pulmonary:     Comments: Decreased breath sounds bilaterally.  Abdominal:     Palpations: Abdomen is soft.  Musculoskeletal:        General: Normal range of motion.     Cervical back: Normal range of motion.  Skin:    General: Skin is warm.  Neurological:     General: No focal deficit present.     Mental Status: She is alert and oriented to person, place, and time.  Psychiatric:        Behavior: Behavior normal.        Judgment: Judgment normal.        LABORATORY DATA:  I have reviewed the data as listed    Component Value Date/Time   NA 137 06/18/2021 0441   NA 141 05/05/2020 1527   K 4.3  06/18/2021 0441   CL 108 06/18/2021 0441   CO2 25 06/18/2021 0441   GLUCOSE 116 (H) 06/18/2021 0441   BUN 14 06/18/2021 0441   BUN 11 05/05/2020 1527   CREATININE 0.90 06/18/2021 0441   CALCIUM 8.2 (L) 06/18/2021 0441   PROT 6.7 05/05/2020 1527   ALBUMIN 3.9 05/05/2020 1527   AST 10 05/05/2020 1527   ALT 14 05/05/2020 1527   ALKPHOS 109 05/05/2020 1527   BILITOT 0.2 05/05/2020 1527   GFRNONAA >60 06/18/2021 0441   GFRAA >60 05/12/2015 0626    No results found for: "SPEP", "UPEP"  Lab Results  Component Value Date   WBC 8.8 06/18/2021   NEUTROABS 5.2 07/30/2020   HGB 8.2 (L) 06/18/2021   HCT 26.2 (L) 06/18/2021   MCV 94.2 06/18/2021   PLT 156 06/18/2021      Chemistry      Component Value Date/Time   NA 137 06/18/2021 0441   NA 141 05/05/2020 1527   K 4.3 06/18/2021 0441   CL 108 06/18/2021 0441   CO2 25 06/18/2021 0441   BUN 14 06/18/2021 0441   BUN 11 05/05/2020 1527   CREATININE 0.90 06/18/2021 0441      Component Value Date/Time   CALCIUM 8.2 (L) 06/18/2021 0441   ALKPHOS 109 05/05/2020 1527   AST 10 05/05/2020 1527   ALT 14 05/05/2020 1527   BILITOT 0.2 05/05/2020 1527       RADIOGRAPHIC STUDIES: I have personally reviewed the radiological images as listed and agreed with the findings in the report. No results found.   ASSESSMENT & PLAN:  Ductal carcinoma in situ (DCIS) of right breast # Right breast multifocal - DCIS high-grade; with comedonecrosis x3.Left breast status postbiopsy-ductal fibrosis.  No concern for malignancy.   #  JUNE 2023- Status post mastectomy 5 mm residual DCIS intermediate.  ER positive.  Left breast-no malignancy noted.  Given Bilateral mastectomies  with DICS on right side; without any evidence of invasive disease- she will not need any further endocrine therapy.    No role for any adjuvant therapy.   # Genetics:  Negative for any deleterious mutation.  # DM [hemoglobin A1c] around 6 as per patient.  Monitor closely.   #  Active smoker: counseled to quit smoking.  #Anxiety: Status post social worker referral.  Stable.  I reviewed the pathology/plan of care with the patient and her mother in detail.  No further follow-ups are recommended at the cancer center.  She will continue follow-up with Dr. Christian Mate her surgeon for follow-up of her postmastectomy visits.  DISPOSITION: # follow up as needed-Dr.B  Cc; Dr.Rodenberg.    No orders of the defined types were placed in this encounter.  All questions were answered. The patient knows to call the clinic with any problems, questions or concerns.      Cammie Sickle, MD 06/22/2021 8:15 AM

## 2021-06-21 NOTE — Progress Notes (Unsigned)
Patient states both ankles is swollen and sore. Patient state she had her first bowel movement since surgrey (6/7) last night and only a small amount came out. Patient states she is taking mirilax twice a day. Patient also stated that she is gassy today.

## 2021-06-22 LAB — SURGICAL PATHOLOGY

## 2021-06-23 ENCOUNTER — Other Ambulatory Visit
Admission: RE | Admit: 2021-06-23 | Discharge: 2021-06-23 | Disposition: A | Discharge status: Home / Self Care | Payer: 59 | Attending: Surgery | Admitting: Surgery

## 2021-06-23 ENCOUNTER — Encounter: Payer: Self-pay | Admitting: Surgery

## 2021-06-23 ENCOUNTER — Ambulatory Visit (INDEPENDENT_AMBULATORY_CARE_PROVIDER_SITE_OTHER): Payer: 59 | Admitting: Surgery

## 2021-06-23 VITALS — BP 124/79 | HR 103 | Temp 98.0°F | Wt 333.0 lb

## 2021-06-23 DIAGNOSIS — D0511 Intraductal carcinoma in situ of right breast: Secondary | ICD-10-CM

## 2021-06-23 DIAGNOSIS — Z09 Encounter for follow-up examination after completed treatment for conditions other than malignant neoplasm: Secondary | ICD-10-CM

## 2021-06-23 DIAGNOSIS — Z9013 Acquired absence of bilateral breasts and nipples: Secondary | ICD-10-CM | POA: Insufficient documentation

## 2021-06-23 DIAGNOSIS — R609 Edema, unspecified: Secondary | ICD-10-CM | POA: Insufficient documentation

## 2021-06-23 LAB — BASIC METABOLIC PANEL
Anion gap: 5 (ref 5–15)
BUN: 14 mg/dL (ref 6–20)
CO2: 26 mmol/L (ref 22–32)
Calcium: 9 mg/dL (ref 8.9–10.3)
Chloride: 108 mmol/L (ref 98–111)
Creatinine, Ser: 0.61 mg/dL (ref 0.44–1.00)
GFR, Estimated: >60 mL/min (ref >60)
Glucose, Bld: 119 mg/dL — ABNORMAL HIGH (ref 70–99)
Potassium: 4.1 mmol/L (ref 3.5–5.1)
Sodium: 139 mmol/L (ref 135–145)

## 2021-06-23 NOTE — Progress Notes (Signed)
[facility] POST-OP OFFICE VISIT  06/23/2021  HPI: Heather Cain is a 51 y.o. female 7 days s/p evacuation of right postmastectomy hematoma.  8 days status post bilateral mastectomies.  She is already had her pathology reviewed by oncology.  She presents with a record of her Jackson-Pratt drainage.  The right side as expected is draining more than the left, the left is sufficiently diminished to consider removal.  She he reports some dull pain on the right side which she describes as a tightness.  She denies fevers, chills, nausea and vomiting.     Vital signs: BP 124/79   Pulse (!) 103   Temp 98 F (36.7 C) (Oral)   Wt (!) 333 lb (151 kg)   LMP 05/22/2021   SpO2 97%   BMI 58.99 kg/m    Physical Exam: Constitutional: She appears well.  Presents with her mother.  Skin: Both mastectomy flaps appear clean and intact and viable.  There is residual ecchymosis on the right side as expected.  Jackson-Pratt drain sites appear in good condition.  I proceeded with removal of the left-sided drain.  Staples removed from the left side and replaced with half-inch Steri-Strips over Mastisol.  Assessment/Plan: This is a 51 y.o. 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  No additional upstaging of pathology noted.  Patient Active Problem List   Diagnosis Date Noted   Genetic testing 05/17/2021   Family history of uterine cancer 05/03/2021   Family history of bladder cancer 05/03/2021   Ductal carcinoma in situ (DCIS) of right breast 03/15/2021   Neuropathy 10/25/2020   Numbness and tingling 10/25/2020   Trigger index finger of right hand 10/25/2020   Atypical chest pain 07/30/2020   Sinusitis 05/18/2020   Class 3 severe obesity due to excess calories with serious comorbidity and body mass index (BMI) of 60.0 to 69.9 in adult ([place]) 05/18/2020   Diabetes ([place]) 05/12/2020   Elevated triglycerides with high cholesterol 05/12/2020   Abnormality of  breast on screening mammography 05/12/2020    -We will continue the right sided mastectomy drain for another week.  Questions answered.  We will be glad to see her back as needed in the interval.  She reports concerns regarding bilateral lower extremity edema.  She has been taking Lasix on a 3 times weekly basis, and was concerned if she should increase it.  However she has been sleeping in a recliner based on instructions given to her from the hospital.  I advised her there is no need to continue sleeping in an somewhat upright position and that she can lay flat get her legs elevated adequately.  We will check her potassium today, and if well within normal limits I believe she may be able to take Lasix daily over the next 4 to 5 days without interruption.  Then resume her 3 times weekly dosing.  Ronny Bacon M.D., FACS 06/23/2021, 8:49 PM

## 2021-06-23 NOTE — Patient Instructions (Addendum)
Please get labs drawn at [facility]. You do not need appointment.    If you have any concerns or questions, please feel free to call our office. See follow up appointment below.   Total or Modified Radical Mastectomy, Care After The following information offers guidance on how to care for yourself after your procedure. Your health care provider may also give you more specific instructions. If you have problems or questions, contact your health care provider. What can I expect after the procedure? After the procedure, it is common to have: Pain, soreness, or tenderness. Numbness. Swelling. Neuropathic (nerve) pain in the chest wall, armpit, or arm. Stiffness in the arm or shoulder. Feelings of stress, sadness, or depression. If the lymph nodes under your arm were removed, you may have arm swelling, weakness, or numbness on the same side of your body as your surgery. Follow these instructions at home: Incision care  Follow instructions from your health care provider about how to take care of your incision. Make sure you: Wash your hands with soap and water for at least 20 seconds before you change your bandage (dressing). If soap and water are not available, use hand sanitizer. Change your dressing as told by your health care provider. Leave stitches (sutures), skin glue, or adhesive strips in place. These skin closures may need to stay in place for 2 weeks or longer. If adhesive strip edges start to loosen and curl up, you may trim the loose edges. Do not remove adhesive strips completely unless your health care provider tells you to do that. Check your incision area every day for signs of infection. Check for: Redness, swelling, or more pain. Fluid or blood. Warmth. Pus or a bad smell. If you were sent home with a surgical drain in place, follow instructions from your health care provider. This may include emptying the drain and keeping track of the amount of drainage. Bathing Do  not take baths, swim, or use a hot tub until your health care provider approves. Ask your health care provider if you may take showers. You may only be allowed to take sponge baths. Activity  Return to your normal activities as told by your health care provider. Ask your health care provider what activities are safe for you. It may take several weeks to return to full activity. Avoid activities that take a lot of effort. Rest as told by your health care provider. Ask friends and family to help with chores, including child care, meal preparation, laundry, and shopping. Be careful to avoid any activities that could cause an injury to your arm on the side of your surgery. Do not lift anything that is heavier than 10 lb (4.5 kg), or the limit that you are told, until your health care provider says that it is safe. Avoid lifting with the arm on the side of your surgery. Do not carry heavy objects on your shoulder. After your drain is removed, do exercises to prevent stiffness and swelling in your arm. Talk with your health care provider about which exercises are safe for you. General instructions Take over-the-counter and prescription medicines only as told by your health care provider. You may eat what you usually do. Keep your arm raised (elevated) above the level of your heart when you are sitting or lying down. Do not wear tight jewelry on your arm, wrist, or fingers on the side of your surgery. You may be given a tight sleeve (compression bandage) to wear over your arm on  the side of your surgery. Wear this sleeve as told by your health care provider. Ask your health care provider when you can start wearing a bra or using a breast prosthesis. Before you are involved in certain procedures, such as giving blood or having your blood pressure checked, tell all of your health care providers if lymph nodes under your arm were removed. This is important information. Follow-up Keep all follow-up visits.  This is important. Get checked for extra fluid around your lymph nodes (lymphedema) as often as told by your health care provider. Medicines Take over-the-counter and prescription medicines as told by your health care provider. Take your antibiotic medicine as told by your health care provider. Do not stop taking the antibiotic even if you start to feel better. Ask your health care provider if the medicine prescribed to you: Requires you to avoid driving or using machinery. Can cause constipation. You may need to take these actions to prevent or treat constipation: Drink enough fluid to keep your urine pale yellow. Take over-the-counter or prescription medicines. Eat foods that are high in fiber, such as beans, whole grains, and fresh fruits and vegetables. Limit foods that are high in fat and processed sugars, such as fried or sweet foods. Lifestyle Do not use any products that contain nicotine or tobacco before the procedure. These products include cigarettes, chewing tobacco, and vaping devices, such as e-cigarettes. These can delay incision healing. If you need help quitting, ask your health care provider. Contact a health care provider if: You have chills or a fever. Your pain medicine is not working. Your arm swelling, weakness, or numbness has not improved after a few weeks. You have new swelling in your breast area or arm. You have redness, swelling, or more pain in your incision area. You have fluid or blood coming from your incision. Your incision feels warm to the touch. You have pus or a bad smell coming from your incision. Get help right away if: You have very bad pain in your breast area or arm. You have a sudden onset of chest pain. You have a fast heart rate. You have difficulty breathing. These symptoms may be an emergency. Get help right away. Call 911. Do not wait to see if the symptoms will go away. Do not drive yourself to the hospital. Summary Follow instructions  from your health care provider about how to take care of your incision. Check your incision area every day for signs of infection. Ask your health care provider what activities are safe for you. Keep all follow-up visits. This is important. Contact a health care provider if you have new swelling in your breast area or arm. This information is not intended to replace advice given to you by your health care provider. Make sure you discuss any questions you have with your health care provider. Document Revised: 09/26/2020 Document Reviewed: 09/26/2020 Elsevier Patient Education  [place]

## 2021-06-24 ENCOUNTER — Other Ambulatory Visit: Payer: Self-pay | Admitting: Internal Medicine

## 2021-06-30 ENCOUNTER — Other Ambulatory Visit: Payer: Self-pay

## 2021-06-30 ENCOUNTER — Encounter: Payer: Self-pay | Admitting: Surgery

## 2021-06-30 ENCOUNTER — Ambulatory Visit (INDEPENDENT_AMBULATORY_CARE_PROVIDER_SITE_OTHER): Payer: 59 | Admitting: Surgery

## 2021-06-30 VITALS — BP 121/84 | HR 75 | Temp 97.9°F | Ht 63.0 in | Wt 329.8 lb

## 2021-06-30 DIAGNOSIS — D0511 Intraductal carcinoma in situ of right breast: Secondary | ICD-10-CM

## 2021-06-30 DIAGNOSIS — Z09 Encounter for follow-up examination after completed treatment for conditions other than malignant neoplasm: Secondary | ICD-10-CM

## 2021-07-05 ENCOUNTER — Encounter: Payer: Self-pay | Admitting: Internal Medicine

## 2021-07-05 ENCOUNTER — Ambulatory Visit (INDEPENDENT_AMBULATORY_CARE_PROVIDER_SITE_OTHER): Payer: 59 | Admitting: Internal Medicine

## 2021-07-05 VITALS — Ht 63.0 in | Wt 333.2 lb

## 2021-07-05 DIAGNOSIS — R609 Edema, unspecified: Secondary | ICD-10-CM | POA: Diagnosis not present

## 2021-07-05 DIAGNOSIS — G629 Polyneuropathy, unspecified: Secondary | ICD-10-CM | POA: Diagnosis not present

## 2021-07-05 DIAGNOSIS — E782 Mixed hyperlipidemia: Secondary | ICD-10-CM | POA: Diagnosis not present

## 2021-07-05 DIAGNOSIS — Z6841 Body Mass Index (BMI) 40.0 and over, adult: Secondary | ICD-10-CM | POA: Diagnosis not present

## 2021-07-05 DIAGNOSIS — D649 Anemia, unspecified: Secondary | ICD-10-CM | POA: Diagnosis not present

## 2021-07-05 DIAGNOSIS — D0511 Intraductal carcinoma in situ of right breast: Secondary | ICD-10-CM | POA: Diagnosis not present

## 2021-07-05 DIAGNOSIS — E119 Type 2 diabetes mellitus without complications: Secondary | ICD-10-CM | POA: Diagnosis not present

## 2021-07-05 LAB — GLUCOSE, POCT (MANUAL RESULT ENTRY): POC Glucose: 116 mg/dl — AB (ref 70–99)

## 2021-07-05 MED ORDER — FLUTICASONE PROPIONATE 50 MCG/ACT NA SUSP: 2.0000 | Freq: Every day | NASAL | 3 refills | Status: DC

## 2021-07-05 MED ORDER — ACCU-CHEK SOFTCLIX LANCETS MISC: 3 refills | Status: DC

## 2021-07-05 MED ORDER — ACCU-CHEK GUIDE VI STRP: ORAL_STRIP | 3 refills | Status: DC

## 2021-07-05 MED ORDER — SPIRONOLACTONE 25 MG PO TABS: 25.0000 mg | ORAL_TABLET | Freq: Every day | ORAL | 3 refills | Status: DC

## 2021-07-05 NOTE — Assessment & Plan Note (Signed)

## 2021-07-05 NOTE — Progress Notes (Signed)
Established Patient Office Visit  Subjective:  Patient ID: Heather Cain, female    DOB: 07/22/1970  Age: 51 y.o. MRN: [id number]  CC:  Chief Complaint  Patient presents with   Follow-up    HPI  Heather Cain presents for general check  Past Medical History:  Diagnosis Date   DCIS (ductal carcinoma in situ) of breast    Diabetes mellitus without complication (HCC)    Family history of bladder cancer    Family history of uterine cancer    High cholesterol    Neuromuscular disorder (HCC)    peripheral neuropathy   PONV (postoperative nausea and vomiting)    Vaginal Pap smear, abnormal     Past Surgical History:  Procedure Laterality Date   BREAST BIOPSY Right 03/08/2021   right breast bx coil clip -positive   BREAST BIOPSY Right 03/24/2021   stereo biopsy x clip/DUCTAL CARCINOMA IN SITU (DCIS),   BREAST BIOPSY Left 05/18/2021   ribbon clip   BREAST BIOPSY Right 05/18/2021   coil clip   CRYOTHERAPY     EVACUATION BREAST HEMATOMA Right 06/16/2021   Procedure: EVACUATION HEMATOMA BREAST;  Surgeon: Campbell Lerner, MD;  Location: [facility];  Service: General;  Laterality: Right;   SIMPLE MASTECTOMY WITH AXILLARY SENTINEL NODE BIOPSY Right 06/15/2021   Procedure: SIMPLE MASTECTOMY WITH AXILLARY SENTINEL NODE BIOPSY, Total;  Surgeon: Campbell Lerner, MD;  Location: [facility];  Service: General;  Laterality: Right;   TOTAL MASTECTOMY Left 06/15/2021   Procedure: TOTAL MASTECTOMY, prophylactic left;  Surgeon: Campbell Lerner, MD;  Location: [facility];  Service: General;  Laterality: Left;   WRIST SURGERY      Family History  Problem Relation Age of Onset   Uterine cancer Mother 71   Bladder Cancer Father 109   Skin cancer Maternal Aunt    Cancer - Other Maternal Grandfather        possible colon cancer   Breast cancer Maternal Great-grandmother     Social History   Socioeconomic History   Marital status: Single    Spouse name: Not on file   Number of children: Not  on file   Years of education: Not on file   Highest education level: Not on file  Occupational History   Not on file  Tobacco Use   Smoking status: Former    Packs/day: 1.00    Types: Cigarettes    Quit date: 05/30/2021    Years since quitting: 0.0   Smokeless tobacco: Never  Vaping Use   Vaping Use: Every day  Substance and Sexual Activity   Alcohol use: Yes    Comment: social   Drug use: Yes    Types: Marijuana   Sexual activity: Not Currently    Birth control/protection: None  Other Topics Concern   Not on file  Social History Narrative   Lives in [place]; self; no children; 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 former Smoke ; rare alcohol.    Social Determinants of Health   Financial Resource Strain: Low Risk  (06/01/2021)   Overall Financial Resource Strain (CARDIA)    Difficulty of Paying Living Expenses: Not hard at all  Food Insecurity: No Food Insecurity (06/01/2021)   Hunger Vital Sign    Worried About Running Out of Food in the Last Year: Never true    Ran Out of Food in the Last Year: Never true  Transportation Needs: No Transportation Needs (06/01/2021)   PRAPARE - Administrator, Civil Service (Medical): No  Lack of Transportation (Non-Medical): No  Physical Activity: Inactive (06/01/2021)   Exercise Vital Sign    Days of Exercise per Week: 0 days    Minutes of Exercise per Session: 0 min  Stress: Stress Concern Present (06/01/2021)   [employer] of Occupational Health - Occupational Stress Questionnaire    Feeling of Stress : Rather much  Social Connections: Socially Isolated (06/01/2021)   Social Connection and Isolation Panel [NHANES]    Frequency of Communication with Friends and Family: More than three times a week    Frequency of Social Gatherings with Friends and Family: More than three times a week    Attends Religious Services: Never    Database administrator or Organizations: No    Attends Banker Meetings: Never    Marital Status: Never  married  Intimate Partner Violence: Not At Risk (06/01/2021)   Humiliation, Afraid, Rape, and Kick questionnaire    Fear of Current or Ex-Partner: No    Emotionally Abused: No    Physically Abused: No    Sexually Abused: No     Current Outpatient Medications:    albuterol (VENTOLIN HFA) 108 (90 Base) MCG/ACT inhaler, Inhale 2 puffs into the lungs every 6 (six) hours as needed for wheezing or shortness of breath., Disp: 8 g, Rfl: 0   Cobalamin Combinations (B-12) [phone] MCG SUBL, daily., Disp: , Rfl:    furosemide (LASIX) 20 MG tablet, Take 1 tablet by mouth once daily, Disp: 30 tablet, Rfl: 0   gabapentin (NEURONTIN) 100 MG capsule, TAKE 1 CAPSULE BY MOUTH TWICE DAILY FOR 7 DAYS THEN INCREASE TO 2 CAPS TWICE DAILY AND CONTINUE, Disp: , Rfl:    glimepiride (AMARYL) 2 MG tablet, TAKE 1 TABLET BY MOUTH ONCE DAILY BEFORE BREAKFAST, Disp: 30 tablet, Rfl: 0   loratadine (CLARITIN) 10 MG tablet, Take 10 mg by mouth daily., Disp: , Rfl:    rosuvastatin (CRESTOR) 20 MG tablet, Take 1 tablet by mouth once daily, Disp: 30 tablet, Rfl: 0   spironolactone (ALDACTONE) 25 MG tablet, Take 1 tablet (25 mg total) by mouth daily., Disp: 90 tablet, Rfl: 3   Accu-Chek Softclix Lancets lancets, USE 1  TO CHECK GLUCOSE ONCE DAILY, Disp: 100 each, Rfl: 3   fluticasone (FLONASE) 50 MCG/ACT nasal spray, Place 2 sprays into both nostrils daily., Disp: 11.1 mL, Rfl: 3   glucose blood (ACCU-CHEK GUIDE) test strip, USE 1 STRIP TO CHECK GLUCOSE ONCE DAILY, Disp: 100 each, Rfl: 3   Allergies  Allergen Reactions   Codeine Itching and Anaphylaxis    ROS Review of Systems  Constitutional: Negative.   HENT: Negative.    Eyes: Negative.   Respiratory: Negative.  Negative for chest tightness and wheezing.   Cardiovascular:  Positive for leg swelling.       Patient has swelling of the both legs 1+ Patient also has evidence of neuropathy  Gastrointestinal:  Positive for abdominal distention. Negative for blood in  stool and constipation.  Endocrine: Negative.   Genitourinary: Negative.   Musculoskeletal: Negative.   Skin: Negative.   Allergic/Immunologic: Negative.   Neurological: Negative.  Negative for dizziness.       Neuropathy of the both legs  Hematological: Negative.   Psychiatric/Behavioral: Negative.  Negative for agitation, behavioral problems and decreased concentration.   All other systems reviewed and are negative.     Objective:    Physical Exam HENT:     Head: Normocephalic.     Nose: Nose normal.  Eyes:  Pupils: Pupils are equal, round, and reactive to light.  Cardiovascular:     Rate and Rhythm: Regular rhythm.     Pulses: Normal pulses.  Musculoskeletal:     Comments: Swelling of both legs with edema and dermatitis of the both lower legs  Skin:    Coloration: Skin is not jaundiced.  Neurological:     General: No focal deficit present.  Psychiatric:        Mood and Affect: Mood normal.   Patient  Ht 5\' 3"  (1.6 m)   Wt (!) 333 lb 3.2 oz (151.1 kg)   LMP 05/22/2021   BMI 59.02 kg/m  Wt Readings from Last 3 Encounters:  07/05/21 (!) 333 lb 3.2 oz (151.1 kg)  06/30/21 (!) 329 lb 12.8 oz (149.6 kg)  06/23/21 (!) 333 lb (151 kg)     Health Maintenance Due  Topic Date Due   FOOT EXAM  Never done   URINE MICROALBUMIN  Never done   HIV Screening  Never done   Hepatitis C Screening  Never done   Zoster Vaccines- Shingrix (1 of 2) Never done   COLONOSCOPY (Pts 45-21yrs Insurance coverage will need to be confirmed)  Never done   TETANUS/TDAP  04/22/2016   COVID-19 Vaccine (3 - Moderna risk series) 05/13/2019    There are no preventive care reminders to display for this patient.  Lab Results  Component Value Date   TSH 1.850 05/05/2020   Lab Results  Component Value Date   WBC 8.8 06/18/2021   HGB 8.2 (L) 06/18/2021   HCT 26.2 (L) 06/18/2021   MCV 94.2 06/18/2021   PLT 156 06/18/2021   Lab Results  Component Value Date   NA 139 06/23/2021   K  4.1 06/23/2021   CO2 26 06/23/2021   GLUCOSE 119 (H) 06/23/2021   BUN 14 06/23/2021   CREATININE 0.61 06/23/2021   BILITOT 0.2 05/05/2020   ALKPHOS 109 05/05/2020   AST 10 05/05/2020   ALT 14 05/05/2020   PROT 6.7 05/05/2020   ALBUMIN 3.9 05/05/2020   CALCIUM 9.0 06/23/2021   ANIONGAP 5 06/23/2021   EGFR 73 05/05/2020   Lab Results  Component Value Date   CHOL 183 05/05/2020   Lab Results  Component Value Date   HDL 30 (L) 05/05/2020   Lab Results  Component Value Date   LDLCALC 90 05/05/2020   Lab Results  Component Value Date   TRIG 378 (H) 05/05/2020   Lab Results  Component Value Date   CHOLHDL 6.1 (H) 05/05/2020   Lab Results  Component Value Date   HGBA1C 7.7 (H) 06/09/2021      Assessment & Plan:   Problem List Items Addressed This Visit       Endocrine   Diabetes (HCC) - Primary    - The patient's blood sugar is labile on med. - The patient will continue the current treatment regimen.  - I encouraged the patient to regularly check blood sugar.  - I encouraged the patient to monitor diet. I encouraged the patient to eat low-carb and low-sugar to help prevent blood sugar spikes.  - I encouraged the patient to continue following their prescribed treatment plan for diabetes - I informed the patient to get help if blood sugar drops below 54mg /dL, or if suddenly have trouble thinking clearly or breathing.  Patient was advised to buy a book on diabetes from a local bookstore or from [place].  Patient should read 2 chapters every day to keep  the motivation going, this is in addition to some of the materials we provided them from the office.  There are other resources on the Internet like YouTube and wilkipedia to get an education on the diabetes      Relevant Medications   Accu-Chek Softclix Lancets lancets   glucose blood (ACCU-CHEK GUIDE) test strip   fluticasone (FLONASE) 50 MCG/ACT nasal spray   Other Relevant Orders   POCT glucose (manual entry)  (Completed)   HM DIABETES EYE EXAM (Completed)   Ambulatory referral to Podiatry     Nervous and Auditory   Neuropathy    Refer to neurology        Other   Elevated triglycerides with high cholesterol    Hypercholesterolemia  I advised the patient to follow Mediterranean diet This diet is rich in fruits vegetables and whole grain, and This diet is also rich in fish and lean meat Patient should also eat a handful of almonds or walnuts daily Recent heart study indicated that average follow-up on this kind of diet reduces the cardiovascular mortality by 50 to 70%==      Relevant Medications   spironolactone (ALDACTONE) 25 MG tablet   Class 3 severe obesity due to excess calories with serious comorbidity and body mass index (BMI) of 60.0 to 69.9 in adult [facility])    - I encouraged the patient to lose weight.  - I educated them on making healthy dietary choices including eating more fruits and vegetables and less fried foods. - I encouraged the patient to exercise more, and educated on the benefits of exercise including weight loss, diabetes prevention, and hypertension prevention.   Dietary counseling with a registered dietician  Referral to a weight management support group (e.g. Weight Watchers, Overeaters Anonymous)  If your BMI is greater than 29 or you have gained more than 15 pounds you should work on weight loss.  Attend a healthy cooking class       Ductal carcinoma in situ (DCIS) of right breast    Patient is a bilateral mastectomy      Edema   Anemia   Other Visit Diagnoses     Encounter for diabetic foot exam (HCC)       Relevant Medications   spironolactone (ALDACTONE) 25 MG tablet   Other Relevant Orders   Ambulatory referral to Podiatry       Meds ordered this encounter  Medications   Accu-Chek Softclix Lancets lancets    Sig: USE 1  TO CHECK GLUCOSE ONCE DAILY    Dispense:  100 each    Refill:  3   glucose blood (ACCU-CHEK GUIDE) test strip    Sig:  USE 1 STRIP TO CHECK GLUCOSE ONCE DAILY    Dispense:  100 each    Refill:  3   fluticasone (FLONASE) 50 MCG/ACT nasal spray    Sig: Place 2 sprays into both nostrils daily.    Dispense:  11.1 mL    Refill:  3   spironolactone (ALDACTONE) 25 MG tablet    Sig: Take 1 tablet (25 mg total) by mouth daily.    Dispense:  90 tablet    Refill:  3  We will get CBC work-up done anemia, suggest colonoscopy  Follow-up: No follow-ups on file.    Corky Downs, MD

## 2021-07-06 ENCOUNTER — Ambulatory Visit: Payer: 59 | Admitting: Internal Medicine

## 2021-07-18 ENCOUNTER — Encounter: Payer: Self-pay | Admitting: Licensed Clinical Social Worker

## 2021-07-18 NOTE — Progress Notes (Signed)
[facility] CSW Progress Note  [facility] representative contacted patient by phone to follow-up on voicemail and post-surgery status.  CSW left patient contact information and request for return call.   Adelene Amas, LCSW

## 2021-07-22 ENCOUNTER — Ambulatory Visit (INDEPENDENT_AMBULATORY_CARE_PROVIDER_SITE_OTHER): Payer: 59 | Admitting: Podiatry

## 2021-07-22 DIAGNOSIS — R600 Localized edema: Secondary | ICD-10-CM

## 2021-07-22 DIAGNOSIS — E0843 Diabetes mellitus due to underlying condition with diabetic autonomic (poly)neuropathy: Secondary | ICD-10-CM | POA: Diagnosis not present

## 2021-07-26 ENCOUNTER — Encounter: Payer: Self-pay | Admitting: Surgery

## 2021-07-26 ENCOUNTER — Ambulatory Visit (INDEPENDENT_AMBULATORY_CARE_PROVIDER_SITE_OTHER): Payer: 59 | Admitting: Surgery

## 2021-07-26 ENCOUNTER — Ambulatory Visit (INDEPENDENT_AMBULATORY_CARE_PROVIDER_SITE_OTHER): Payer: 59 | Admitting: *Deleted

## 2021-07-26 VITALS — BP 114/73 | HR 101 | Temp 97.7°F | Wt 329.6 lb

## 2021-07-26 DIAGNOSIS — Z09 Encounter for follow-up examination after completed treatment for conditions other than malignant neoplasm: Secondary | ICD-10-CM

## 2021-07-26 DIAGNOSIS — Z9013 Acquired absence of bilateral breasts and nipples: Secondary | ICD-10-CM

## 2021-07-26 DIAGNOSIS — Z0000 Encounter for general adult medical examination without abnormal findings: Secondary | ICD-10-CM | POA: Diagnosis not present

## 2021-07-26 DIAGNOSIS — D0511 Intraductal carcinoma in situ of right breast: Secondary | ICD-10-CM

## 2021-07-26 NOTE — Progress Notes (Signed)
[facility] POST-OP OFFICE VISIT  07/26/2021  HPI: Heather Cain is a 51 y.o. female 38 days s/p bilateral mastectomies, with return to the OR for right-sided postmastectomy hematoma.  She still indicates having slightly more discomfort on the right especially in the underarm area.  There is some additional fatty presence, there, notable.  She had previously been somewhat asymmetric in her breast size and this seems to linger in the underarm soft tissues.  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  However she is making marked improvement with full range of motion reported. She reports to me that her current plastic surgeon is no longer available.  I will have to establish care with another.  She does desire to obtain postmastectomy bra with external prosthetics.  Vital signs: BP 114/73   Pulse (!) 101   Temp 97.7 F (36.5 C) (Oral)   Wt (!) 329 lb 9.6 oz (149.5 kg)   SpO2 98%   BMI 58.39 kg/m    Physical Exam: Constitutional: She appears well.  Skin: All her flaps are clean and viable without evidence of induration, inflammation or nodularity.  There is some asymmetry in the residual soft tissues of the lateral aspects of both mastectomy sites.  Minimal degree of dog earring present.  Assessment/Plan: This is a 51 y.o. female 38 days s/p bilateral mastectomies.  Progressing well.  Patient Active Problem List   Diagnosis Date Noted   Anemia 07/05/2021   Status post bilateral mastectomy 06/23/2021   Edema 06/23/2021   Genetic testing 05/17/2021   Family history of uterine cancer 05/03/2021   Family history of bladder cancer 05/03/2021   Ductal carcinoma in situ (DCIS) of right breast 03/15/2021   Neuropathy 10/25/2020   Numbness and tingling 10/25/2020   Trigger index finger of right hand 10/25/2020   Atypical chest pain 07/30/2020   Sinusitis 05/18/2020   Class 3 severe obesity due to excess calories with serious  comorbidity and body mass index (BMI) of 60.0 to 69.9 in adult ([place]) 05/18/2020   Diabetes ([place]) 05/12/2020   Elevated triglycerides with high cholesterol 05/12/2020   Abnormality of breast on screening mammography 05/12/2020    -We remain readily available to assist her in any way should new concerns or issues arise.  Prescription for postmastectomy bra with external prosthetics written, and location of filling agency given to patient.   Ronny Bacon M.D., FACS 07/26/2021, 12:12 PM 6

## 2021-07-26 NOTE — Patient Instructions (Signed)
If you have any concerns or questions, please feel free to call our office.   Total or Modified Radical Mastectomy, Care After The following information offers guidance on how to care for yourself after your procedure. Your health care provider may also give you more specific instructions. If you have problems or questions, contact your health care provider. What can I expect after the procedure? After the procedure, it is common to have: Pain, soreness, or tenderness. Numbness. Swelling. Neuropathic (nerve) pain in the chest wall, armpit, or arm. Stiffness in the arm or shoulder. Feelings of stress, sadness, or depression. If the lymph nodes under your arm were removed, you may have arm swelling, weakness, or numbness on the same side of your body as your surgery. Follow these instructions at home: Incision care  Follow instructions from your health care provider about how to take care of your incision. Make sure you: Wash your hands with soap and water for at least 20 seconds before you change your bandage (dressing). If soap and water are not available, use hand sanitizer. Change your dressing as told by your health care provider. Leave stitches (sutures), skin glue, or adhesive strips in place. These skin closures may need to stay in place for 2 weeks or longer. If adhesive strip edges start to loosen and curl up, you may trim the loose edges. Do not remove adhesive strips completely unless your health care provider tells you to do that. Check your incision area every day for signs of infection. Check for: Redness, swelling, or more pain. Fluid or blood. Warmth. Pus or a bad smell. If you were sent home with a surgical drain in place, follow instructions from your health care provider. This may include emptying the drain and keeping track of the amount of drainage. Bathing Do not take baths, swim, or use a hot tub until your health care provider approves. Ask your health care provider  if you may take showers. You may only be allowed to take sponge baths. Activity  Return to your normal activities as told by your health care provider. Ask your health care provider what activities are safe for you. It may take several weeks to return to full activity. Avoid activities that take a lot of effort. Rest as told by your health care provider. Ask friends and family to help with chores, including child care, meal preparation, laundry, and shopping. Be careful to avoid any activities that could cause an injury to your arm on the side of your surgery. Do not lift anything that is heavier than 10 lb (4.5 kg), or the limit that you are told, until your health care provider says that it is safe. Avoid lifting with the arm on the side of your surgery. Do not carry heavy objects on your shoulder. After your drain is removed, do exercises to prevent stiffness and swelling in your arm. Talk with your health care provider about which exercises are safe for you. General instructions Take over-the-counter and prescription medicines only as told by your health care provider. You may eat what you usually do. Keep your arm raised (elevated) above the level of your heart when you are sitting or lying down. Do not wear tight jewelry on your arm, wrist, or fingers on the side of your surgery. You may be given a tight sleeve (compression bandage) to wear over your arm on the side of your surgery. Wear this sleeve as told by your health care provider. Ask your health care provider when   you can start wearing a bra or using a breast prosthesis. Before you are involved in certain procedures, such as giving blood or having your blood pressure checked, tell all of your health care providers if lymph nodes under your arm were removed. This is important information. Follow-up Keep all follow-up visits. This is important. Get checked for extra fluid around your lymph nodes (lymphedema) as often as told by your  health care provider. Medicines Take over-the-counter and prescription medicines as told by your health care provider. Take your antibiotic medicine as told by your health care provider. Do not stop taking the antibiotic even if you start to feel better. Ask your health care provider if the medicine prescribed to you: Requires you to avoid driving or using machinery. Can cause constipation. You may need to take these actions to prevent or treat constipation: Drink enough fluid to keep your urine pale yellow. Take over-the-counter or prescription medicines. Eat foods that are high in fiber, such as beans, whole grains, and fresh fruits and vegetables. Limit foods that are high in fat and processed sugars, such as fried or sweet foods. Lifestyle Do not use any products that contain nicotine or tobacco before the procedure. These products include cigarettes, chewing tobacco, and vaping devices, such as e-cigarettes. These can delay incision healing. If you need help quitting, ask your health care provider. Contact a health care provider if: You have chills or a fever. Your pain medicine is not working. Your arm swelling, weakness, or numbness has not improved after a few weeks. You have new swelling in your breast area or arm. You have redness, swelling, or more pain in your incision area. You have fluid or blood coming from your incision. Your incision feels warm to the touch. You have pus or a bad smell coming from your incision. Get help right away if: You have very bad pain in your breast area or arm. You have a sudden onset of chest pain. You have a fast heart rate. You have difficulty breathing. These symptoms may be an emergency. Get help right away. Call 911. Do not wait to see if the symptoms will go away. Do not drive yourself to the hospital. Summary Follow instructions from your health care provider about how to take care of your incision. Check your incision area every day  for signs of infection. Ask your health care provider what activities are safe for you. Keep all follow-up visits. This is important. Contact a health care provider if you have new swelling in your breast area or arm. This information is not intended to replace advice given to you by your health care provider. Make sure you discuss any questions you have with your health care provider. Document Revised: 09/26/2020 Document Reviewed: 09/26/2020 Elsevier Patient Education  2023 Elsevier Inc.  

## 2021-07-27 ENCOUNTER — Telehealth: Payer: 59 | Admitting: Plastic Surgery

## 2021-07-27 LAB — COMPLETE METABOLIC PANEL WITH GFR
AG Ratio: 1.4 (calc) (ref 1.0–2.5)
ALT: 16 U/L (ref 6–29)
AST: 14 U/L (ref 10–35)
Albumin: 3.7 g/dL (ref 3.6–5.1)
Alkaline phosphatase (APISO): 97 U/L (ref 37–153)
BUN: 12 mg/dL (ref 7–25)
CO2: 22 mmol/L (ref 20–32)
Calcium: 8.5 mg/dL — ABNORMAL LOW (ref 8.6–10.4)
Chloride: 104 mmol/L (ref 98–110)
Creat: 0.63 mg/dL (ref 0.50–1.03)
Globulin: 2.7 g/dL (calc) (ref 1.9–3.7)
Glucose, Bld: 290 mg/dL — ABNORMAL HIGH (ref 65–99)
Potassium: 4.2 mmol/L (ref 3.5–5.3)
Sodium: 136 mmol/L (ref 135–146)
Total Bilirubin: 0.3 mg/dL (ref 0.2–1.2)
Total Protein: 6.4 g/dL (ref 6.1–8.1)
eGFR: 107 mL/min/{1.73_m2} (ref >=60)

## 2021-07-27 LAB — LIPID PANEL
Cholesterol: 97 mg/dL (ref <200)
HDL: 31 mg/dL — ABNORMAL LOW (ref >=50)
LDL Cholesterol (Calc): 27 mg/dL (calc)
Non-HDL Cholesterol (Calc): 66 mg/dL (calc) (ref <130)
Total CHOL/HDL Ratio: 3.1 (calc) (ref <5.0)
Triglycerides: 373 mg/dL — ABNORMAL HIGH (ref <150)

## 2021-07-27 LAB — CBC WITH DIFFERENTIAL/PLATELET
Absolute Monocytes: 549 cells/uL (ref 200–950)
Basophils Absolute: 18 cells/uL (ref 0–200)
Basophils Relative: 0.3 %
Eosinophils Absolute: 171 cells/uL (ref 15–500)
Eosinophils Relative: 2.9 %
HCT: 37.2 % (ref 35.0–45.0)
Hemoglobin: 11.6 g/dL — ABNORMAL LOW (ref 11.7–15.5)
Lymphs Abs: 2620 cells/uL (ref 850–3900)
MCH: 28.4 pg (ref 27.0–33.0)
MCHC: 31.2 g/dL — ABNORMAL LOW (ref 32.0–36.0)
MCV: 91 fL (ref 80.0–100.0)
MPV: 10.3 fL (ref 7.5–12.5)
Monocytes Relative: 9.3 %
Neutro Abs: 2543 cells/uL (ref 1500–7800)
Neutrophils Relative %: 43.1 %
Platelets: 208 10*3/uL (ref 140–400)
RBC: 4.09 10*6/uL (ref 3.80–5.10)
RDW: 14.5 % (ref 11.0–15.0)
Total Lymphocyte: 44.4 %
WBC: 5.9 10*3/uL (ref 3.8–10.8)

## 2021-07-27 LAB — TSH: TSH: 4.22 mIU/L

## 2021-07-30 ENCOUNTER — Other Ambulatory Visit: Payer: Self-pay | Admitting: Nurse Practitioner

## 2021-08-03 NOTE — Progress Notes (Signed)
   Chief Complaint  Patient presents with   diabetic foot care    Diabetic foot care    HPI: 51 y.o. female presenting today as a new patient for evaluation of bilateral lower extremity swelling as well as a routine diabetic foot exam.  Patient does have a history of diabetes mellitus.  She presents for further treatment and evaluation  Past Medical History:  Diagnosis Date   DCIS (ductal carcinoma in situ) of breast    Diabetes mellitus without complication ([place])    Family history of bladder cancer    Family history of uterine cancer    High cholesterol    Neuromuscular disorder ([place])    peripheral neuropathy   PONV (postoperative nausea and vomiting)    Vaginal Pap smear, abnormal     Past Surgical History:  Procedure Laterality Date   BREAST BIOPSY Right 03/08/2021   right breast bx coil clip -positive   BREAST BIOPSY Right 03/24/2021   stereo biopsy x clip/DUCTAL CARCINOMA IN SITU (DCIS),   BREAST BIOPSY Left 05/18/2021   ribbon clip   BREAST BIOPSY Right 05/18/2021   coil clip   CRYOTHERAPY     EVACUATION BREAST HEMATOMA Right 06/16/2021   Procedure: EVACUATION HEMATOMA BREAST;  Surgeon: Ronny Bacon, MD;  Location: [facility];  Service: General;  Laterality: Right;   SIMPLE MASTECTOMY WITH AXILLARY SENTINEL NODE BIOPSY Right 06/15/2021   Procedure: SIMPLE MASTECTOMY WITH AXILLARY SENTINEL NODE BIOPSY, Total;  Surgeon: Ronny Bacon, MD;  Location: [facility];  Service: General;  Laterality: Right;   TOTAL MASTECTOMY Left 06/15/2021   Procedure: TOTAL MASTECTOMY, prophylactic left;  Surgeon: Ronny Bacon, MD;  Location: [facility];  Service: General;  Laterality: Left;   WRIST SURGERY      Allergies  Allergen Reactions   Codeine Itching and Anaphylaxis     Physical Exam: General: The patient is alert and oriented x3 in no acute distress.  Dermatology: Skin is warm, dry and supple bilateral lower extremities. Negative for open lesions or macerations.  Vascular:  Palpable pedal pulses bilaterally. Capillary refill within normal limits.  Chronic bilateral lower extremity edema noted  Neurological: Light touch and protective threshold grossly intact  Musculoskeletal Exam: No pedal deformities noted  Radiographic Exam:  Normal osseous mineralization. Joint spaces preserved. No fracture/dislocation/boney destruction.    Assessment: 1.  Diabetes mellitus last A1c on 06/09/2021 was 7.7 2.  Chronic bilateral lower extremity edema  Plan of Care:  1. Patient evaluated. X-Rays reviewed.  2.  Comprehensive diabetic foot exam performed today 3.  Recommend compression hose daily to the bilateral lower extremity.  The patient has compression hose at home but does not wear them currently 4.  Continue Lasix as per prescribing PCP 5.  Recommend good supportive shoes and sneakers.  Advised against going barefoot  6.  Return to clinic annually      Edrick Kins, DPM [facility]  Dr. Edrick Kins, DPM    [address], [place] [address]                Office [phone]  Fax [phone]

## 2021-08-08 ENCOUNTER — Ambulatory Visit (INDEPENDENT_AMBULATORY_CARE_PROVIDER_SITE_OTHER): Payer: 59 | Admitting: Plastic Surgery

## 2021-08-08 VITALS — Ht 63.0 in | Wt 328.4 lb

## 2021-08-08 DIAGNOSIS — D0511 Intraductal carcinoma in situ of right breast: Secondary | ICD-10-CM | POA: Diagnosis not present

## 2021-08-09 NOTE — Progress Notes (Signed)
   Referring Provider Cletis Athens, MD [address] [place],  [place] [address]   CC:  Breast reconstruction  Heather Cain is an 51 y.o. female.  HPI: The patient is a 51 year old very pleasant patient who does have multiple comorbidities.  She recently had a bilateral mastectomy.  She has not had to have chemo or radiation based on her tumor.  She saw Dr. Claudia Desanctis previously but it was recommended that she had delayed reconstruction.  Part of this was that she was smoking and had diabetes.  She has stopped smoking since May and not using any tobacco products.  She does note though that her sugars been higher she thinks since she started taking spironolactone.  She is interested in reconstruction with tissue expanders or implants possibly but also is interested in exploring autologous reconstruction if this is a possibility for her.  Review of Systems General: Breast:  Physical Exam    08/08/2021    2:42 PM 07/26/2021    9:47 AM 07/05/2021    2:38 PM  Vitals with BMI  Height '5\' 3"'$   '5\' 3"'$   Weight 328 lbs 6 oz 329 lbs 10 oz 333 lbs 3 oz  BMI 58.19 94.4 96.75  Systolic  916   Diastolic  73   Pulse  384     General:  No acute distress,  Alert and oriented, Non-Toxic, Normal speech and affect Breast: Well-healed bilateral mastectomy scars.  Sternal notch to scar on the right is 21 and sternal notch to scar is 21 on the left.  Base width of both scars is 21 cm. Abdomen: Patient has significant adipose in her abdomen but also some excess skin.  Assessment/Plan Patient is a possible candidate for tissue expander reconstruction.  Given her high sugars recently I want to confirm that her hemoglobin A1c has not risen.  She does want to explore the possibility of autologous reconstruction which may not be possible due to her BMI but I told her that I would check with some of our doctors we refer to since some deep surgeons perform this procedure on patients with very high BMI.  Lennice Sites 08/09/2021, 8:47 AM

## 2021-08-16 ENCOUNTER — Encounter: Payer: Self-pay | Admitting: Internal Medicine

## 2021-08-16 ENCOUNTER — Ambulatory Visit (INDEPENDENT_AMBULATORY_CARE_PROVIDER_SITE_OTHER): Payer: 59 | Admitting: Internal Medicine

## 2021-08-16 VITALS — BP 132/87 | HR 97 | Ht 63.0 in | Wt 328.8 lb

## 2021-08-16 DIAGNOSIS — R609 Edema, unspecified: Secondary | ICD-10-CM | POA: Diagnosis not present

## 2021-08-16 DIAGNOSIS — E119 Type 2 diabetes mellitus without complications: Secondary | ICD-10-CM

## 2021-08-16 DIAGNOSIS — Z9013 Acquired absence of bilateral breasts and nipples: Secondary | ICD-10-CM | POA: Diagnosis not present

## 2021-08-16 DIAGNOSIS — Z6841 Body Mass Index (BMI) 40.0 and over, adult: Secondary | ICD-10-CM | POA: Diagnosis not present

## 2021-08-16 LAB — GLUCOSE, POCT (MANUAL RESULT ENTRY): POC Glucose: 289 mg/dl — AB (ref 70–99)

## 2021-08-16 MED ORDER — GLIMEPIRIDE 4 MG PO TABS: 4.0000 mg | ORAL_TABLET | Freq: Every day | ORAL | 3 refills | Status: DC

## 2021-08-16 MED ORDER — POTASSIUM CHLORIDE CRYS ER 20 MEQ PO TBCR: 20.0000 meq | EXTENDED_RELEASE_TABLET | Freq: Every day | ORAL | 3 refills | Status: DC

## 2021-08-16 NOTE — Progress Notes (Signed)
Established Patient Office Visit  Subjective:  Patient ID: Heather Cain, female    DOB: [birth year]  Age: 51 y.o. MRN: [id number]  CC:  Chief Complaint  Patient presents with   Diabetes    6 week follow up, patient was started on a new medication for edema, however the medicine is causing the patient's sugar to stay increased.    Diabetes    Heather Cain presents for check   Past Medical History:  Diagnosis Date   DCIS (ductal carcinoma in situ) of breast    Diabetes mellitus without complication ([place])    Family history of bladder cancer    Family history of uterine cancer    High cholesterol    Neuromuscular disorder ([place])    peripheral neuropathy   PONV (postoperative nausea and vomiting)    Vaginal Pap smear, abnormal     Past Surgical History:  Procedure Laterality Date   BREAST BIOPSY Right 03/08/2021   right breast bx coil clip -positive   BREAST BIOPSY Right 03/24/2021   stereo biopsy x clip/DUCTAL CARCINOMA IN SITU (DCIS),   BREAST BIOPSY Left 05/18/2021   ribbon clip   BREAST BIOPSY Right 05/18/2021   coil clip   CRYOTHERAPY     EVACUATION BREAST HEMATOMA Right 06/16/2021   Procedure: EVACUATION HEMATOMA BREAST;  Surgeon: Ronny Bacon, MD;  Location: [facility] ORS;  Service: General;  Laterality: Right;   SIMPLE MASTECTOMY WITH AXILLARY SENTINEL NODE BIOPSY Right 06/15/2021   Procedure: SIMPLE MASTECTOMY WITH AXILLARY SENTINEL NODE BIOPSY, Total;  Surgeon: Ronny Bacon, MD;  Location: [facility] OR[facility];  Service: General;  Laterality: Right;   TOTAL MASTECTOMY Left 06/15/2021   Procedure: TOTAL MASTECTOMY, prophylactic left;  Surgeon: Ronny Bacon, MD;  Location: [facility] OR[facility];  Service: General;  Laterality: Left;   WRIST SURGERY      Family History  Problem Relation Age of Onset   Uterine cancer Mother 48   Bladder Cancer Father 58   Skin cancer Maternal Aunt    Cancer - Other Maternal Grandfather        possible colon cancer   Breast cancer Maternal  Great-grandmother     Social History   Socioeconomic History   Marital status: Single    Spouse name: Not on file   Number of children: Not on file   Years of education: Not on file   Highest education level: Not on file  Occupational History   Not on file  Tobacco Use   Smoking status: Former    Packs/day: 1.00    Types: Cigarettes    Quit date: 05/30/2021    Years since quitting: 0.2   Smokeless tobacco: Never  Vaping Use   Vaping Use: Every day  Substance and Sexual Activity   Alcohol use: Yes    Comment: social   Drug use: Yes    Types: Marijuana   Sexual activity: Not Currently    Birth control/protection: None  Other Topics Concern   Not on file  Social History Narrative   Lives in [place]; self; no children; 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 former Smoke ; rare alcohol.    Social Determinants of Health   Financial Resource Strain: Low Risk  (06/01/2021)   Overall Financial Resource Strain (CARDIA)    Difficulty of Paying Living Expenses: Not hard at all  Food Insecurity: No Food Insecurity (06/01/2021)   Hunger Vital Sign    Worried About Running Out of Food in the Last Year: Never true    Ran Out of  Food in the Last Year: Never true  Transportation Needs: No Transportation Needs (06/01/2021)   PRAPARE - Hydrologist (Medical): No    Lack of Transportation (Non-Medical): No  Physical Activity: Inactive (06/01/2021)   Exercise Vital Sign    Days of Exercise per Week: 0 days    Minutes of Exercise per Session: 0 min  Stress: Stress Concern Present (06/01/2021)   [place]    Feeling of Stress : Rather much  Social Connections: Socially Isolated (06/01/2021)   Social Connection and Isolation Panel [NHANES]    Frequency of Communication with Friends and Family: More than three times a week    Frequency of Social Gatherings with Friends and Family: More than three times a week    Attends  Religious Services: Never    Marine scientist or Organizations: No    Attends Archivist Meetings: Never    Marital Status: Never married  Intimate Partner Violence: Not At Risk (06/01/2021)   Humiliation, Afraid, Rape, and Kick questionnaire    Fear of Current or Ex-Partner: No    Emotionally Abused: No    Physically Abused: No    Sexually Abused: No     Current Outpatient Medications:    Accu-Chek Softclix Lancets lancets, USE 1  TO CHECK GLUCOSE ONCE DAILY, Disp: 100 each, Rfl: 3   albuterol (VENTOLIN HFA) 108 (90 Base) MCG/ACT inhaler, Inhale 2 puffs into the lungs every 6 (six) hours as needed for wheezing or shortness of breath., Disp: 8 g, Rfl: 0   Cobalamin Combinations (B-12) [phone] MCG SUBL, daily., Disp: , Rfl:    fluticasone (FLONASE) 50 MCG/ACT nasal spray, Place 2 sprays into both nostrils daily., Disp: 11.1 mL, Rfl: 3   furosemide (LASIX) 20 MG tablet, Take 1 tablet by mouth once daily (Patient taking differently: Take 20 mg by mouth daily. Pt takes once a day, 3 times a week), Disp: 30 tablet, Rfl: 0   gabapentin (NEURONTIN) 100 MG capsule, TAKE 1 CAPSULE BY MOUTH TWICE DAILY FOR 7 DAYS THEN INCREASE TO 2 CAPS TWICE DAILY AND CONTINUE, Disp: , Rfl:    glucose blood (ACCU-CHEK GUIDE) test strip, USE 1 STRIP TO CHECK GLUCOSE ONCE DAILY, Disp: 100 each, Rfl: 3   loratadine (CLARITIN) 10 MG tablet, Take 10 mg by mouth daily., Disp: , Rfl:    rosuvastatin (CRESTOR) 20 MG tablet, Take 1 tablet by mouth once daily, Disp: 30 tablet, Rfl: 0   Allergies  Allergen Reactions   Codeine Itching and Anaphylaxis    ROS Review of Systems  Constitutional: Negative.   HENT: Negative.    Eyes: Negative.   Respiratory: Negative.    Cardiovascular: Negative.   Gastrointestinal: Negative.   Endocrine: Negative.   Genitourinary: Negative.   Musculoskeletal: Negative.   Skin: Negative.   Allergic/Immunologic: Negative.   Neurological: Negative.   Hematological:  Negative.   Psychiatric/Behavioral: Negative.    All other systems reviewed and are negative.     Objective:    Physical Exam Vitals reviewed.  Constitutional:      Appearance: Normal appearance.  HENT:     Mouth/Throat:     Mouth: Mucous membranes are moist.  Eyes:     Pupils: Pupils are equal, round, and reactive to light.  Neck:     Vascular: No carotid bruit.  Cardiovascular:     Rate and Rhythm: Normal rate and regular rhythm.  Pulses: Normal pulses.     Heart sounds: Normal heart sounds.  Pulmonary:     Effort: Pulmonary effort is normal.     Breath sounds: Normal breath sounds.  Abdominal:     General: Bowel sounds are normal.     Palpations: Abdomen is soft. There is no hepatomegaly, splenomegaly or mass.     Tenderness: There is no abdominal tenderness.     Hernia: No hernia is present.  Musculoskeletal:        General: No tenderness.     Cervical back: Neck supple.     Right lower leg: No edema.     Left lower leg: No edema.  Skin:    Findings: No rash.  Neurological:     Mental Status: She is alert and oriented to person, place, and time.     Motor: No weakness.  Psychiatric:        Mood and Affect: Mood and affect normal.        Behavior: Behavior normal.     BP 132/87   Pulse 97   Ht 5' 3"  (1.6 m)   Wt (!) 328 lb 12.8 oz (149.1 kg)   BMI 58.24 kg/m  Wt Readings from Last 3 Encounters:  08/16/21 (!) 328 lb 12.8 oz (149.1 kg)  08/08/21 (!) 328 lb 6.4 oz (149 kg)  07/26/21 (!) 329 lb 9.6 oz (149.5 kg)     Health Maintenance Due  Topic Date Due   URINE MICROALBUMIN  Never done   HIV Screening  Never done   Hepatitis C Screening  Never done   Zoster Vaccines- Shingrix (1 of 2) Never done   COLONOSCOPY (Pts 45-52yr Insurance coverage will need to be confirmed)  Never done   TETANUS/TDAP  04/22/2016   COVID-19 Vaccine (3 - Moderna risk series) 05/13/2019   INFLUENZA VACCINE  08/09/2021    There are no preventive care reminders to  display for this patient.  Lab Results  Component Value Date   TSH 4.22 07/26/2021   Lab Results  Component Value Date   WBC 5.9 07/26/2021   HGB 11.6 (L) 07/26/2021   HCT 37.2 07/26/2021   MCV 91.0 07/26/2021   PLT 208 07/26/2021   Lab Results  Component Value Date   NA 136 07/26/2021   K 4.2 07/26/2021   CO2 22 07/26/2021   GLUCOSE 290 (H) 07/26/2021   BUN 12 07/26/2021   CREATININE 0.63 07/26/2021   BILITOT 0.3 07/26/2021   ALKPHOS 109 05/05/2020   AST 14 07/26/2021   ALT 16 07/26/2021   PROT 6.4 07/26/2021   ALBUMIN 3.9 05/05/2020   CALCIUM 8.5 (L) 07/26/2021   ANIONGAP 5 06/23/2021   EGFR 107 07/26/2021   Lab Results  Component Value Date   CHOL 97 07/26/2021   Lab Results  Component Value Date   HDL 31 (L) 07/26/2021   Lab Results  Component Value Date   LDLCALC 27 07/26/2021   Lab Results  Component Value Date   TRIG 373 (H) 07/26/2021   Lab Results  Component Value Date   CHOLHDL 3.1 07/26/2021   Lab Results  Component Value Date   HGBA1C 7.7 (H) 06/09/2021      Assessment & Plan:   Problem List Items Addressed This Visit       Endocrine   Diabetes ([place] - Primary    - The patient's blood sugar is labile on med. - The patient will continue the current treatment regimen.  - I encouraged  the patient to regularly check blood sugar.  - I encouraged the patient to monitor diet. I encouraged the patient to eat low-carb and low-sugar to help prevent blood sugar spikes.  - I encouraged the patient to continue following their prescribed treatment plan for diabetes - I informed the patient to get help if blood sugar drops below 70m/dL, or if suddenly have trouble thinking clearly or breathing.  Patient was advised to buy a book on diabetes from a local bookstore or from [place]  Patient should read 2 chapters every day to keep the motivation going, this is in addition to some of the materials we provided them from the office.  There are other  resources on the Internet like YouTube and wilkipedia to get an education on the diabetes      Relevant Orders   POCT glucose (manual entry) (Completed)     Other   Class 3 severe obesity due to excess calories with serious comorbidity and body mass index (BMI) of 60.0 to 69.9 in adult ([facility]    - I encouraged the patient to lose weight.  - I educated them on making healthy dietary choices including eating more fruits and vegetables and less fried foods. - I encouraged the patient to exercise more, and educated on the benefits of exercise including weight loss, diabetes prevention, and hypertension prevention.   Dietary counseling with a registered dietician  Referral to a weight management support group (e.g. Weight Watchers, Overeaters Anonymous)  If your BMI is greater than 29 or you have gained more than 15 pounds you should work on weight loss.  Attend a healthy cooking class       Status post bilateral mastectomy    Patient will need a breast surgery      Edema    She has 1+ edema in the legs       No orders of the defined types were placed in this encounter.   Follow-up: No follow-ups on file.    JCletis Athens MD

## 2021-08-16 NOTE — Assessment & Plan Note (Signed)

## 2021-08-16 NOTE — Assessment & Plan Note (Signed)
Patient will need a breast surgery

## 2021-08-16 NOTE — Assessment & Plan Note (Signed)
She has 1+ edema in the legs

## 2021-08-16 NOTE — Assessment & Plan Note (Signed)

## 2021-08-28 ENCOUNTER — Encounter: Payer: Self-pay | Admitting: Plastic Surgery

## 2021-08-31 ENCOUNTER — Other Ambulatory Visit: Payer: Self-pay | Admitting: Internal Medicine

## 2021-08-31 ENCOUNTER — Other Ambulatory Visit: Payer: Self-pay | Admitting: Nurse Practitioner

## 2021-08-31 DIAGNOSIS — E119 Type 2 diabetes mellitus without complications: Secondary | ICD-10-CM

## 2021-09-06 DIAGNOSIS — M79672 Pain in left foot: Secondary | ICD-10-CM

## 2021-09-07 ENCOUNTER — Telehealth: Payer: Self-pay

## 2021-09-07 NOTE — Telephone Encounter (Signed)
Faxed medical records to [employer] [facility] at [phone].

## 2021-09-08 ENCOUNTER — Other Ambulatory Visit: Payer: Self-pay | Admitting: Internal Medicine

## 2021-09-08 DIAGNOSIS — E119 Type 2 diabetes mellitus without complications: Secondary | ICD-10-CM

## 2021-09-09 ENCOUNTER — Ambulatory Visit (INDEPENDENT_AMBULATORY_CARE_PROVIDER_SITE_OTHER): Payer: 59 | Admitting: Plastic Surgery

## 2021-09-09 DIAGNOSIS — D0511 Intraductal carcinoma in situ of right breast: Secondary | ICD-10-CM

## 2021-09-09 DIAGNOSIS — Z9013 Acquired absence of bilateral breasts and nipples: Secondary | ICD-10-CM | POA: Diagnosis not present

## 2021-09-09 NOTE — Progress Notes (Signed)
   Referring Provider Cletis Athens, MD [address] [place],  [place] [address]   CC:  Breast reconstruction  Heather Cain is an 51 y.o. female.  HPI: 51 year old who is interested in breast reconstruction.  She was interested in a Diep flap but we discussed with [facility] and based on BMI she would not be a candidate at this time.  Review of Systems General: No fever or chills  Physical Exam    08/16/2021    2:38 PM 08/08/2021    2:42 PM 07/26/2021    9:47 AM  Vitals with BMI  Height '5\' 3"'$  '5\' 3"'$    Weight 328 lbs 13 oz 328 lbs 6 oz 329 lbs 10 oz  BMI 58.26 88.11 03.1  Systolic 594  585  Diastolic 87  73  Pulse 97  101    General:  No acute distress,  Alert and oriented, Non-Toxic, Normal speech and affect Breast: Exam unchanged well-healed mastectomy sites  Assessment/Plan Patient has continued to have problems with her blood sugar and her last hemoglobin A1c was 7.7.  Would like to get this under better control prior to surgery if possible.  She is also going to explore possible options for Diep flap [facility].  Lennice Sites 09/09/2021, 10:51 AM

## 2021-09-13 ENCOUNTER — Ambulatory Visit (INDEPENDENT_AMBULATORY_CARE_PROVIDER_SITE_OTHER): Payer: 59 | Admitting: Internal Medicine

## 2021-09-13 ENCOUNTER — Encounter: Payer: Self-pay | Admitting: Internal Medicine

## 2021-09-13 VITALS — BP 121/81 | HR 96 | Ht 63.0 in | Wt 329.8 lb

## 2021-09-13 DIAGNOSIS — E782 Mixed hyperlipidemia: Secondary | ICD-10-CM | POA: Diagnosis not present

## 2021-09-13 DIAGNOSIS — Z6841 Body Mass Index (BMI) 40.0 and over, adult: Secondary | ICD-10-CM

## 2021-09-13 DIAGNOSIS — E119 Type 2 diabetes mellitus without complications: Secondary | ICD-10-CM | POA: Diagnosis not present

## 2021-09-13 LAB — POCT GLYCOSYLATED HEMOGLOBIN (HGB A1C): HbA1c POC (<> result, manual entry): 8.4 % (ref 4.0–5.6)

## 2021-09-13 LAB — GLUCOSE, POCT (MANUAL RESULT ENTRY): POC Glucose: 258 mg/dl — AB (ref 70–99)

## 2021-09-13 MED ORDER — OZEMPIC (0.25 OR 0.5 MG/DOSE) 2 MG/1.5ML ~~LOC~~ SOPN: 0.2500 mg | PEN_INJECTOR | SUBCUTANEOUS | 4 refills | Status: DC

## 2021-09-13 NOTE — Assessment & Plan Note (Signed)

## 2021-09-13 NOTE — Progress Notes (Signed)
Established Patient Office Visit  Subjective:  Patient ID: NUMA HEATWOLE, female    DOB: 1970-08-11  Age: 51 y.o. MRN: [id number]  CC:  Chief Complaint  Patient presents with   Diabetes    Diabetes    [name] presents for diabetes check  Past Medical History:  Diagnosis Date   DCIS (ductal carcinoma in situ) of breast    Diabetes mellitus without complication ([place])    Family history of bladder cancer    Family history of uterine cancer    High cholesterol    Neuromuscular disorder ([place])    peripheral neuropathy   PONV (postoperative nausea and vomiting)    Vaginal Pap smear, abnormal     Past Surgical History:  Procedure Laterality Date   BREAST BIOPSY Right 03/08/2021   right breast bx coil clip -positive   BREAST BIOPSY Right 03/24/2021   stereo biopsy x clip/DUCTAL CARCINOMA IN SITU (DCIS),   BREAST BIOPSY Left 05/18/2021   ribbon clip   BREAST BIOPSY Right 05/18/2021   coil clip   CRYOTHERAPY     EVACUATION BREAST HEMATOMA Right 06/16/2021   Procedure: EVACUATION HEMATOMA BREAST;  Surgeon: Ronny Bacon, MD;  Location: [facility] ORS;  Service: General;  Laterality: Right;   SIMPLE MASTECTOMY WITH AXILLARY SENTINEL NODE BIOPSY Right 06/15/2021   Procedure: SIMPLE MASTECTOMY WITH AXILLARY SENTINEL NODE BIOPSY, Total;  Surgeon: Ronny Bacon, MD;  Location: [facility];  Service: General;  Laterality: Right;   TOTAL MASTECTOMY Left 06/15/2021   Procedure: TOTAL MASTECTOMY, prophylactic left;  Surgeon: Ronny Bacon, MD;  Location: [facility];  Service: General;  Laterality: Left;   WRIST SURGERY      Family History  Problem Relation Age of Onset   Uterine cancer Mother 64   Bladder Cancer Father 85   Skin cancer Maternal Aunt    Cancer - Other Maternal Grandfather        possible colon cancer   Breast cancer Maternal Great-grandmother     Social History   Socioeconomic History   Marital status: Single    Spouse name: Not on file   Number of  children: Not on file   Years of education: Not on file   Highest education level: Not on file  Occupational History   Not on file  Tobacco Use   Smoking status: Former    Packs/day: 1.00    Types: Cigarettes    Quit date: 05/30/2021    Years since quitting: 0.2   Smokeless tobacco: Never  Vaping Use   Vaping Use: Every day  Substance and Sexual Activity   Alcohol use: Yes    Comment: social   Drug use: Yes    Types: Marijuana   Sexual activity: Not Currently    Birth control/protection: None  Other Topics Concern   Not on file  Social History Narrative   Lives in [place]; self; no children; 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 former Smoke ; rare alcohol.    Social Determinants of Health   Financial Resource Strain: Low Risk  (06/01/2021)   Overall Financial Resource Strain (CARDIA)    Difficulty of Paying Living Expenses: Not hard at all  Food Insecurity: No Food Insecurity (06/01/2021)   Hunger Vital Sign    Worried About Running Out of Food in the Last Year: Never true    Ran Out of Food in the Last Year: Never true  Transportation Needs: No Transportation Needs (06/01/2021)   PRAPARE - Hydrologist (Medical): No  Lack of Transportation (Non-Medical): No  Physical Activity: Inactive (06/01/2021)   Exercise Vital Sign    Days of Exercise per Week: 0 days    Minutes of Exercise per Session: 0 min  Stress: Stress Concern Present (06/01/2021)   [place]    Feeling of Stress : Rather much  Social Connections: Socially Isolated (06/01/2021)   Social Connection and Isolation Panel [NHANES]    Frequency of Communication with Friends and Family: More than three times a week    Frequency of Social Gatherings with Friends and Family: More than three times a week    Attends Religious Services: Never    Marine scientist or Organizations: No    Attends Archivist Meetings: Never    Marital  Status: Never married  Intimate Partner Violence: Not At Risk (06/01/2021)   Humiliation, Afraid, Rape, and Kick questionnaire    Fear of Current or Ex-Partner: No    Emotionally Abused: No    Physically Abused: No    Sexually Abused: No     Current Outpatient Medications:    Semaglutide,0.25 or 0.5MG/DOS, (OZEMPIC, 0.25 OR 0.5 MG/DOSE,) 2 MG/1.5ML SOPN, Inject 0.25 mg into the skin once a week., Disp: 1.5 mL, Rfl: 4   Accu-Chek Softclix Lancets lancets, USE 1  TO CHECK GLUCOSE ONCE DAILY, Disp: 100 each, Rfl: 0   albuterol (VENTOLIN HFA) 108 (90 Base) MCG/ACT inhaler, Inhale 2 puffs into the lungs every 6 (six) hours as needed for wheezing or shortness of breath., Disp: 8 g, Rfl: 0   Cobalamin Combinations (B-12) [phone] MCG SUBL, daily., Disp: , Rfl:    fluticasone (FLONASE) 50 MCG/ACT nasal spray, Place 2 sprays into both nostrils daily., Disp: 11.1 mL, Rfl: 3   furosemide (LASIX) 20 MG tablet, Take 1 tablet by mouth once daily, Disp: 30 tablet, Rfl: 0   gabapentin (NEURONTIN) 100 MG capsule, TAKE 1 CAPSULE BY MOUTH TWICE DAILY FOR 7 DAYS THEN INCREASE TO 2 CAPS TWICE DAILY AND CONTINUE, Disp: , Rfl:    glimepiride (AMARYL) 4 MG tablet, Take 1 tablet (4 mg total) by mouth daily before breakfast., Disp: 30 tablet, Rfl: 3   glucose blood (ACCU-CHEK GUIDE) test strip, USE TO TEST BLOOD SUGAR ONCE DAILY, Disp: 50 each, Rfl: 0   loratadine (CLARITIN) 10 MG tablet, Take 10 mg by mouth daily., Disp: , Rfl:    potassium chloride SA (KLOR-CON M) 20 MEQ tablet, Take 1 tablet (20 mEq total) by mouth daily., Disp: 30 tablet, Rfl: 3   rosuvastatin (CRESTOR) 20 MG tablet, Take 1 tablet by mouth once daily, Disp: 30 tablet, Rfl: 0   Allergies  Allergen Reactions   Codeine Itching and Anaphylaxis    ROS Review of Systems  Constitutional: Negative.   HENT: Negative.    Eyes: Negative.   Respiratory: Negative.    Cardiovascular: Negative.   Gastrointestinal: Negative.   Endocrine: Negative.    Genitourinary: Negative.   Musculoskeletal: Negative.   Skin: Negative.   Allergic/Immunologic: Negative.   Neurological: Negative.   Hematological: Negative.   Psychiatric/Behavioral: Negative.    All other systems reviewed and are negative.     Objective:    Physical Exam Vitals reviewed.  Constitutional:      Appearance: Normal appearance.  HENT:     Mouth/Throat:     Mouth: Mucous membranes are moist.  Eyes:     Pupils: Pupils are equal, round, and reactive to light.  Neck:  Vascular: No carotid bruit.  Cardiovascular:     Rate and Rhythm: Normal rate and regular rhythm.     Pulses: Normal pulses.     Heart sounds: Normal heart sounds.  Pulmonary:     Effort: Pulmonary effort is normal.     Breath sounds: Normal breath sounds.  Abdominal:     General: Bowel sounds are normal.     Palpations: Abdomen is soft. There is no hepatomegaly, splenomegaly or mass.     Tenderness: There is no abdominal tenderness.     Hernia: No hernia is present.  Musculoskeletal:        General: No tenderness.     Cervical back: Neck supple.     Right lower leg: No edema.     Left lower leg: No edema.  Skin:    Findings: No rash.  Neurological:     Mental Status: She is alert and oriented to person, place, and time.     Motor: No weakness.  Psychiatric:        Mood and Affect: Mood and affect normal.        Behavior: Behavior normal.     BP 121/81   Pulse 96   Ht 5' 3"  (1.6 m)   Wt (!) 329 lb 12.8 oz (149.6 kg)   BMI 58.42 kg/m  Wt Readings from Last 3 Encounters:  09/13/21 (!) 329 lb 12.8 oz (149.6 kg)  08/16/21 (!) 328 lb 12.8 oz (149.1 kg)  08/08/21 (!) 328 lb 6.4 oz (149 kg)     Health Maintenance Due  Topic Date Due   URINE MICROALBUMIN  Never done   HIV Screening  Never done   Hepatitis C Screening  Never done   Zoster Vaccines- Shingrix (1 of 2) Never done   COLONOSCOPY (Pts 45-29yr Insurance coverage will need to be confirmed)  Never done    TETANUS/TDAP  04/22/2016   COVID-19 Vaccine (3 - Moderna risk series) 05/13/2019   INFLUENZA VACCINE  08/09/2021    There are no preventive care reminders to display for this patient.  Lab Results  Component Value Date   TSH 4.22 07/26/2021   Lab Results  Component Value Date   WBC 5.9 07/26/2021   HGB 11.6 (L) 07/26/2021   HCT 37.2 07/26/2021   MCV 91.0 07/26/2021   PLT 208 07/26/2021   Lab Results  Component Value Date   NA 136 07/26/2021   K 4.2 07/26/2021   CO2 22 07/26/2021   GLUCOSE 290 (H) 07/26/2021   BUN 12 07/26/2021   CREATININE 0.63 07/26/2021   BILITOT 0.3 07/26/2021   ALKPHOS 109 05/05/2020   AST 14 07/26/2021   ALT 16 07/26/2021   PROT 6.4 07/26/2021   ALBUMIN 3.9 05/05/2020   CALCIUM 8.5 (L) 07/26/2021   ANIONGAP 5 06/23/2021   EGFR 107 07/26/2021   Lab Results  Component Value Date   CHOL 97 07/26/2021   Lab Results  Component Value Date   HDL 31 (L) 07/26/2021   Lab Results  Component Value Date   LDLCALC 27 07/26/2021   Lab Results  Component Value Date   TRIG 373 (H) 07/26/2021   Lab Results  Component Value Date   CHOLHDL 3.1 07/26/2021   Lab Results  Component Value Date   HGBA1C 8.4 09/13/2021      Assessment & Plan:   Problem List Items Addressed This Visit       Endocrine   Diabetes ([place] - Primary    -  The patient's blood sugar is labile on med. - The patient will continue the current treatment regimen.  - I encouraged the patient to regularly check blood sugar.  - I encouraged the patient to monitor diet. I encouraged the patient to eat low-carb and low-sugar to help prevent blood sugar spikes.  - I encouraged the patient to continue following their prescribed treatment plan for diabetes - I informed the patient to get help if blood sugar drops below 13m/dL, or if suddenly have trouble thinking clearly or breathing.  Patient was advised to buy a book on diabetes from a local bookstore or from [place]  Patient  should read 2 chapters every day to keep the motivation going, this is in addition to some of the materials we provided them from the office.  There are other resources on the Internet like YouTube and wilkipedia to get an education on the diabetes      Relevant Medications   Semaglutide,0.25 or 0.5MG/DOS, (OZEMPIC, 0.25 OR 0.5 MG/DOSE,) 2 MG/1.5ML SOPN   Other Relevant Orders   POCT HgB A1C (Completed)   POCT glucose (manual entry) (Completed)     Other   Elevated triglycerides with high cholesterol    Patient educated on diet Hypercholesterolemia  I advised the patient to follow Mediterranean diet This diet is rich in fruits vegetables and whole grain, and This diet is also rich in fish and lean meat Patient should also eat a handful of almonds or walnuts daily Recent heart study indicated that average follow-up on this kind of diet reduces the cardiovascular mortality by 50 to 70%==      Class 3 severe obesity due to excess calories with serious comorbidity and body mass index (BMI) of 60.0 to 69.9 in adult ([facility]    We will start Ozempic to control obesity and diabetes      Relevant Medications   Semaglutide,0.25 or 0.5MG/DOS, (OZEMPIC, 0.25 OR 0.5 MG/DOSE,) 2 MG/1.5ML SOPN    Meds ordered this encounter  Medications   Semaglutide,0.25 or 0.5MG/DOS, (OZEMPIC, 0.25 OR 0.5 MG/DOSE,) 2 MG/1.5ML SOPN    Sig: Inject 0.25 mg into the skin once a week.    Dispense:  1.5 mL    Refill:  4    Follow-up: No follow-ups on file.    JCletis Athens MD

## 2021-09-13 NOTE — Assessment & Plan Note (Signed)
We will start Ozempic to control obesity and diabetes

## 2021-09-13 NOTE — Assessment & Plan Note (Signed)
Patient educated on diet Hypercholesterolemia  I advised the patient to follow Mediterranean diet This diet is rich in fruits vegetables and whole grain, and This diet is also rich in fish and lean meat Patient should also eat a handful of almonds or walnuts daily Recent heart study indicated that average follow-up on this kind of diet reduces the cardiovascular mortality by 50 to 70%==

## 2021-09-26 ENCOUNTER — Encounter: Payer: Self-pay | Admitting: Internal Medicine

## 2021-09-26 ENCOUNTER — Ambulatory Visit (INDEPENDENT_AMBULATORY_CARE_PROVIDER_SITE_OTHER): Payer: 59 | Admitting: Internal Medicine

## 2021-09-26 VITALS — BP 135/92 | HR 94 | Ht 63.0 in | Wt 328.4 lb

## 2021-09-26 DIAGNOSIS — M65321 Trigger finger, right index finger: Secondary | ICD-10-CM

## 2021-09-26 DIAGNOSIS — Z6841 Body Mass Index (BMI) 40.0 and over, adult: Secondary | ICD-10-CM

## 2021-09-26 DIAGNOSIS — Z23 Encounter for immunization: Secondary | ICD-10-CM

## 2021-09-26 DIAGNOSIS — Z9013 Acquired absence of bilateral breasts and nipples: Secondary | ICD-10-CM

## 2021-09-26 DIAGNOSIS — E119 Type 2 diabetes mellitus without complications: Secondary | ICD-10-CM

## 2021-09-26 DIAGNOSIS — G4733 Obstructive sleep apnea (adult) (pediatric): Secondary | ICD-10-CM | POA: Diagnosis not present

## 2021-09-26 DIAGNOSIS — J329 Chronic sinusitis, unspecified: Secondary | ICD-10-CM

## 2021-09-26 LAB — GLUCOSE, POCT (MANUAL RESULT ENTRY): POC Glucose: 328 mg/dl — AB (ref 70–99)

## 2021-09-26 LAB — POCT GLYCOSYLATED HEMOGLOBIN (HGB A1C): HbA1c POC (<> result, manual entry): 8.5 % (ref 4.0–5.6)

## 2021-09-26 MED ORDER — ACCU-CHEK GUIDE VI STRP: ORAL_STRIP | 4 refills | Status: DC

## 2021-09-26 MED ORDER — ACCU-CHEK SOFTCLIX LANCETS MISC: 4 refills | Status: DC

## 2021-09-26 NOTE — Assessment & Plan Note (Signed)
Patient is doing well

## 2021-09-26 NOTE — Progress Notes (Signed)
Established Patient Office Visit  Subjective:  Patient ID: Heather Cain, female    DOB: 09-29-1970  Age: 51 y.o. MRN: [id number]  CC:  Chief Complaint  Patient presents with   Diabetes    Diabetes    [name] presents for check up  Past Medical History:  Diagnosis Date   DCIS (ductal carcinoma in situ) of breast    Diabetes mellitus without complication ([place])    Family history of bladder cancer    Family history of uterine cancer    High cholesterol    Neuromuscular disorder ([place])    peripheral neuropathy   PONV (postoperative nausea and vomiting)    Vaginal Pap smear, abnormal     Past Surgical History:  Procedure Laterality Date   BREAST BIOPSY Right 03/08/2021   right breast bx coil clip -positive   BREAST BIOPSY Right 03/24/2021   stereo biopsy x clip/DUCTAL CARCINOMA IN SITU (DCIS),   BREAST BIOPSY Left 05/18/2021   ribbon clip   BREAST BIOPSY Right 05/18/2021   coil clip   CRYOTHERAPY     EVACUATION BREAST HEMATOMA Right 06/16/2021   Procedure: EVACUATION HEMATOMA BREAST;  Surgeon: Ronny Bacon, MD;  Location: [facility] ORS;  Service: General;  Laterality: Right;   SIMPLE MASTECTOMY WITH AXILLARY SENTINEL NODE BIOPSY Right 06/15/2021   Procedure: SIMPLE MASTECTOMY WITH AXILLARY SENTINEL NODE BIOPSY, Total;  Surgeon: Ronny Bacon, MD;  Location: [facility];  Service: General;  Laterality: Right;   TOTAL MASTECTOMY Left 06/15/2021   Procedure: TOTAL MASTECTOMY, prophylactic left;  Surgeon: Ronny Bacon, MD;  Location: [facility];  Service: General;  Laterality: Left;   WRIST SURGERY      Family History  Problem Relation Age of Onset   Uterine cancer Mother 39   Bladder Cancer Father 58   Skin cancer Maternal Aunt    Cancer - Other Maternal Grandfather        possible colon cancer   Breast cancer Maternal Great-grandmother     Social History   Socioeconomic History   Marital status: Single    Spouse name: Not on file   Number of children:  Not on file   Years of education: Not on file   Highest education level: Not on file  Occupational History   Not on file  Tobacco Use   Smoking status: Former    Packs/day: 1.00    Types: Cigarettes    Quit date: 05/30/2021    Years since quitting: 0.3   Smokeless tobacco: Never  Vaping Use   Vaping Use: Every day  Substance and Sexual Activity   Alcohol use: Yes    Comment: social   Drug use: Yes    Types: Marijuana   Sexual activity: Not Currently    Birth control/protection: None  Other Topics Concern   Not on file  Social History Narrative   Lives in [place]; self; no children; 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 former Smoke ; rare alcohol.    Social Determinants of Health   Financial Resource Strain: Low Risk  (06/01/2021)   Overall Financial Resource Strain (CARDIA)    Difficulty of Paying Living Expenses: Not hard at all  Food Insecurity: No Food Insecurity (06/01/2021)   Hunger Vital Sign    Worried About Running Out of Food in the Last Year: Never true    Ran Out of Food in the Last Year: Never true  Transportation Needs: No Transportation Needs (06/01/2021)   PRAPARE - Hydrologist (Medical): No  Lack of Transportation (Non-Medical): No  Physical Activity: Inactive (06/01/2021)   Exercise Vital Sign    Days of Exercise per Week: 0 days    Minutes of Exercise per Session: 0 min  Stress: Stress Concern Present (06/01/2021)   [place]    Feeling of Stress : Rather much  Social Connections: Socially Isolated (06/01/2021)   Social Connection and Isolation Panel [NHANES]    Frequency of Communication with Friends and Family: More than three times a week    Frequency of Social Gatherings with Friends and Family: More than three times a week    Attends Religious Services: Never    Marine scientist or Organizations: No    Attends Archivist Meetings: Never    Marital Status:  Never married  Intimate Partner Violence: Not At Risk (06/01/2021)   Humiliation, Afraid, Rape, and Kick questionnaire    Fear of Current or Ex-Partner: No    Emotionally Abused: No    Physically Abused: No    Sexually Abused: No     Current Outpatient Medications:    Accu-Chek Softclix Lancets lancets, Use as instructed, Disp: 100 each, Rfl: 4   albuterol (VENTOLIN HFA) 108 (90 Base) MCG/ACT inhaler, Inhale 2 puffs into the lungs every 6 (six) hours as needed for wheezing or shortness of breath., Disp: 8 g, Rfl: 0   Cobalamin Combinations (B-12) [phone] MCG SUBL, daily., Disp: , Rfl:    fluticasone (FLONASE) 50 MCG/ACT nasal spray, Place 2 sprays into both nostrils daily., Disp: 11.1 mL, Rfl: 3   furosemide (LASIX) 20 MG tablet, Take 1 tablet by mouth once daily, Disp: 30 tablet, Rfl: 0   gabapentin (NEURONTIN) 100 MG capsule, TAKE 1 CAPSULE BY MOUTH TWICE DAILY FOR 7 DAYS THEN INCREASE TO 2 CAPS TWICE DAILY AND CONTINUE, Disp: , Rfl:    glimepiride (AMARYL) 4 MG tablet, Take 1 tablet (4 mg total) by mouth daily before breakfast., Disp: 30 tablet, Rfl: 3   glucose blood (ACCU-CHEK GUIDE) test strip, Use as directed, Disp: 50 each, Rfl: 4   loratadine (CLARITIN) 10 MG tablet, Take 10 mg by mouth daily., Disp: , Rfl:    potassium chloride SA (KLOR-CON M) 20 MEQ tablet, Take 1 tablet (20 mEq total) by mouth daily., Disp: 30 tablet, Rfl: 3   rosuvastatin (CRESTOR) 20 MG tablet, Take 1 tablet by mouth once daily, Disp: 30 tablet, Rfl: 0   Allergies  Allergen Reactions   Codeine Itching and Anaphylaxis    ROS Review of Systems  Constitutional: Negative.   HENT: Negative.    Eyes: Negative.   Respiratory: Negative.    Cardiovascular: Negative.   Gastrointestinal: Negative.   Endocrine: Negative.   Genitourinary: Negative.   Musculoskeletal: Negative.   Skin: Negative.   Allergic/Immunologic: Negative.   Neurological: Negative.   Hematological: Negative.   Psychiatric/Behavioral:  Negative.    All other systems reviewed and are negative.     Objective:    Physical Exam Vitals reviewed.  Constitutional:      Appearance: Normal appearance.  HENT:     Mouth/Throat:     Mouth: Mucous membranes are moist.  Eyes:     Pupils: Pupils are equal, round, and reactive to light.  Neck:     Vascular: No carotid bruit.  Cardiovascular:     Rate and Rhythm: Normal rate and regular rhythm.     Pulses: Normal pulses.     Heart sounds: Normal heart  sounds.  Pulmonary:     Effort: Pulmonary effort is normal.     Breath sounds: Normal breath sounds.  Abdominal:     General: Bowel sounds are normal.     Palpations: Abdomen is soft. There is no hepatomegaly, splenomegaly or mass.     Tenderness: There is no abdominal tenderness.     Hernia: No hernia is present.  Musculoskeletal:        General: No tenderness.     Cervical back: Neck supple.     Right lower leg: No edema.     Left lower leg: No edema.  Skin:    Findings: No rash.  Neurological:     Mental Status: She is alert and oriented to person, place, and time.     Motor: No weakness.  Psychiatric:        Mood and Affect: Mood and affect normal.        Behavior: Behavior normal.     BP (!) 135/92   Pulse 94   Ht 5' 3"  (1.6 m)   Wt (!) 328 lb 6.4 oz (149 kg)   BMI 58.17 kg/m  Wt Readings from Last 3 Encounters:  09/26/21 (!) 328 lb 6.4 oz (149 kg)  09/13/21 (!) 329 lb 12.8 oz (149.6 kg)  08/16/21 (!) 328 lb 12.8 oz (149.1 kg)     Health Maintenance Due  Topic Date Due   HIV Screening  Never done   Diabetic kidney evaluation - Urine ACR  Never done   Hepatitis C Screening  Never done   Zoster Vaccines- Shingrix (1 of 2) Never done   COLONOSCOPY (Pts 45-21yr Insurance coverage will need to be confirmed)  Never done   TETANUS/TDAP  04/22/2016   COVID-19 Vaccine (3 - Moderna risk series) 05/13/2019    There are no preventive care reminders to display for this patient.  Lab Results  Component  Value Date   TSH 4.22 07/26/2021   Lab Results  Component Value Date   WBC 5.9 07/26/2021   HGB 11.6 (L) 07/26/2021   HCT 37.2 07/26/2021   MCV 91.0 07/26/2021   PLT 208 07/26/2021   Lab Results  Component Value Date   NA 136 07/26/2021   K 4.2 07/26/2021   CO2 22 07/26/2021   GLUCOSE 290 (H) 07/26/2021   BUN 12 07/26/2021   CREATININE 0.63 07/26/2021   BILITOT 0.3 07/26/2021   ALKPHOS 109 05/05/2020   AST 14 07/26/2021   ALT 16 07/26/2021   PROT 6.4 07/26/2021   ALBUMIN 3.9 05/05/2020   CALCIUM 8.5 (L) 07/26/2021   ANIONGAP 5 06/23/2021   EGFR 107 07/26/2021   Lab Results  Component Value Date   CHOL 97 07/26/2021   Lab Results  Component Value Date   HDL 31 (L) 07/26/2021   Lab Results  Component Value Date   LDLCALC 27 07/26/2021   Lab Results  Component Value Date   TRIG 373 (H) 07/26/2021   Lab Results  Component Value Date   CHOLHDL 3.1 07/26/2021   Lab Results  Component Value Date   HGBA1C 8.5 09/26/2021      Assessment & Plan:   Problem List Items Addressed This Visit       Respiratory   Sinusitis    Stable at the present time      Obstructive sleep apnea    Patient complains of snoring at night.  She does not have a restful sleep and she feels Tired and fatigued in the  morning.  We will do a sleep study.        Endocrine   Diabetes ([place])    Restart Ozempic      Relevant Medications   Accu-Chek Softclix Lancets lancets   glucose blood (ACCU-CHEK GUIDE) test strip   Other Relevant Orders   POCT glucose (manual entry) (Completed)   POCT HgB A1C (Completed)     Musculoskeletal and Integument   Trigger index finger of right hand    Stable        Other   Class 3 severe obesity due to excess calories with serious comorbidity and body mass index (BMI) of 60.0 to 69.9 in adult [facility])    - I encouraged the patient to lose weight.  - I educated them on making healthy dietary choices including eating more fruits and vegetables  and less fried foods. - I encouraged the patient to exercise more, and educated on the benefits of exercise including weight loss, diabetes prevention, and hypertension prevention.   Dietary counseling with a registered dietician  Referral to a weight management support group (e.g. Weight Watchers, Overeaters Anonymous)  If your BMI is greater than 29 or you have gained more than 15 pounds you should work on weight loss.  Attend a healthy cooking class       Status post bilateral mastectomy    Patient is doing well      Other Visit Diagnoses     Need for influenza vaccination    -  Primary   Relevant Orders   Flu Vaccine QUAD 6+ mos PF IM (Fluarix Quad PF) (Completed)       Meds ordered this encounter  Medications   Accu-Chek Softclix Lancets lancets    Sig: Use as instructed    Dispense:  100 each    Refill:  4   glucose blood (ACCU-CHEK GUIDE) test strip    Sig: Use as directed    Dispense:  50 each    Refill:  4    Follow-up: No follow-ups on file.    Cletis Athens, MD

## 2021-09-26 NOTE — Assessment & Plan Note (Signed)

## 2021-09-26 NOTE — Assessment & Plan Note (Signed)
Stable

## 2021-09-26 NOTE — Assessment & Plan Note (Signed)
Restart Ozempic

## 2021-09-26 NOTE — Assessment & Plan Note (Signed)
Stable at the present time. 

## 2021-09-26 NOTE — Assessment & Plan Note (Signed)
Patient complains of snoring at night.  She does not have a restful sleep and she feels Tired and fatigued in the morning.  We will do a sleep study.

## 2021-09-27 DIAGNOSIS — G5602 Carpal tunnel syndrome, left upper limb: Secondary | ICD-10-CM | POA: Diagnosis not present

## 2021-09-27 DIAGNOSIS — R202 Paresthesia of skin: Secondary | ICD-10-CM | POA: Diagnosis not present

## 2021-09-27 DIAGNOSIS — G629 Polyneuropathy, unspecified: Secondary | ICD-10-CM | POA: Diagnosis not present

## 2021-09-27 DIAGNOSIS — R200 Anesthesia of skin: Secondary | ICD-10-CM | POA: Diagnosis not present

## 2021-09-27 DIAGNOSIS — M65321 Trigger finger, right index finger: Secondary | ICD-10-CM | POA: Diagnosis not present

## 2021-10-10 ENCOUNTER — Encounter: Payer: Self-pay | Admitting: Internal Medicine

## 2021-10-10 ENCOUNTER — Other Ambulatory Visit: Payer: Self-pay | Admitting: Internal Medicine

## 2021-10-10 ENCOUNTER — Ambulatory Visit (INDEPENDENT_AMBULATORY_CARE_PROVIDER_SITE_OTHER): Payer: 59 | Admitting: Internal Medicine

## 2021-10-10 VITALS — BP 131/82 | HR 90 | Ht 63.0 in | Wt 327.9 lb

## 2021-10-10 DIAGNOSIS — Z6841 Body Mass Index (BMI) 40.0 and over, adult: Secondary | ICD-10-CM

## 2021-10-10 DIAGNOSIS — Z9013 Acquired absence of bilateral breasts and nipples: Secondary | ICD-10-CM

## 2021-10-10 DIAGNOSIS — E119 Type 2 diabetes mellitus without complications: Secondary | ICD-10-CM

## 2021-10-10 DIAGNOSIS — E782 Mixed hyperlipidemia: Secondary | ICD-10-CM

## 2021-10-10 DIAGNOSIS — M65321 Trigger finger, right index finger: Secondary | ICD-10-CM

## 2021-10-10 LAB — GLUCOSE, POCT (MANUAL RESULT ENTRY): POC Glucose: 133 mg/dL — AB (ref 70–99)

## 2021-10-10 MED ORDER — FREESTYLE LIBRE 3 SENSOR MISC: 1.0000 | 4 refills | Status: DC

## 2021-10-10 MED ORDER — TIRZEPATIDE 2.5 MG/0.5ML ~~LOC~~ SOAJ: 2.5000 mg | SUBCUTANEOUS | 3 refills | Status: DC

## 2021-10-10 MED ORDER — FREESTYLE LIBRE 14 DAY READER DEVI: 1.0000 | 4 refills | Status: DC

## 2021-10-10 NOTE — Assessment & Plan Note (Signed)
We will start the patient on Mounjaro, lose weight

## 2021-10-10 NOTE — Assessment & Plan Note (Signed)
Stable at the present time. 

## 2021-10-10 NOTE — Assessment & Plan Note (Signed)

## 2021-10-10 NOTE — Assessment & Plan Note (Signed)
Stable

## 2021-10-10 NOTE — Assessment & Plan Note (Signed)
Hypercholesterolemia  I advised the patient to follow Mediterranean diet This diet is rich in fruits vegetables and whole grain, and This diet is also rich in fish and lean meat Patient should also eat a handful of almonds or walnuts daily Recent heart study indicated that average follow-up on this kind of diet reduces the cardiovascular mortality by 50 to 70%== 

## 2021-10-10 NOTE — Progress Notes (Signed)
Established Patient Office Visit  Subjective:  Patient ID: Heather Cain, female    DOB: [birth year]  Age: 51 y.o. MRN: [id number]  CC:  Chief Complaint  Patient presents with   Diabetes    Diabetes    [name] presents for check up, want to lose wt  Past Medical History:  Diagnosis Date   DCIS (ductal carcinoma in situ) of breast    Diabetes mellitus without complication ([place])    Family history of bladder cancer    Family history of uterine cancer    High cholesterol    Neuromuscular disorder ([place])    peripheral neuropathy   PONV (postoperative nausea and vomiting)    Vaginal Pap smear, abnormal     Past Surgical History:  Procedure Laterality Date   BREAST BIOPSY Right 03/08/2021   right breast bx coil clip -positive   BREAST BIOPSY Right 03/24/2021   stereo biopsy x clip/DUCTAL CARCINOMA IN SITU (DCIS),   BREAST BIOPSY Left 05/18/2021   ribbon clip   BREAST BIOPSY Right 05/18/2021   coil clip   CRYOTHERAPY     EVACUATION BREAST HEMATOMA Right 06/16/2021   Procedure: EVACUATION HEMATOMA BREAST;  Surgeon: Ronny Bacon, MD;  Location: [facility] ORS;  Service: General;  Laterality: Right;   SIMPLE MASTECTOMY WITH AXILLARY SENTINEL NODE BIOPSY Right 06/15/2021   Procedure: SIMPLE MASTECTOMY WITH AXILLARY SENTINEL NODE BIOPSY, Total;  Surgeon: Ronny Bacon, MD;  Location: [facility] OR[facility];  Service: General;  Laterality: Right;   TOTAL MASTECTOMY Left 06/15/2021   Procedure: TOTAL MASTECTOMY, prophylactic left;  Surgeon: Ronny Bacon, MD;  Location: [facility];  Service: General;  Laterality: Left;   WRIST SURGERY      Family History  Problem Relation Age of Onset   Uterine cancer Mother 42   Bladder Cancer Father 93   Skin cancer Maternal Aunt    Cancer - Other Maternal Grandfather        possible colon cancer   Breast cancer Maternal Great-grandmother     Social History   Socioeconomic History   Marital status: Single    Spouse name: Not on file    Number of children: Not on file   Years of education: Not on file   Highest education level: Not on file  Occupational History   Not on file  Tobacco Use   Smoking status: Former    Packs/day: 1.00    Types: Cigarettes    Quit date: 05/30/2021    Years since quitting: 0.3   Smokeless tobacco: Never  Vaping Use   Vaping Use: Every day  Substance and Sexual Activity   Alcohol use: Yes    Comment: social   Drug use: Yes    Types: Marijuana   Sexual activity: Not Currently    Birth control/protection: None  Other Topics Concern   Not on file  Social History Narrative   Lives in [place]; self; no children; 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 former Smoke ; rare alcohol.    Social Determinants of Health   Financial Resource Strain: Low Risk  (06/01/2021)   Overall Financial Resource Strain (CARDIA)    Difficulty of Paying Living Expenses: Not hard at all  Food Insecurity: No Food Insecurity (06/01/2021)   Hunger Vital Sign    Worried About Running Out of Food in the Last Year: Never true    Ran Out of Food in the Last Year: Never true  Transportation Needs: No Transportation Needs (06/01/2021)   PRAPARE - Transportation    Lack  of Transportation (Medical): No    Lack of Transportation (Non-Medical): No  Physical Activity: Inactive (06/01/2021)   Exercise Vital Sign    Days of Exercise per Week: 0 days    Minutes of Exercise per Session: 0 min  Stress: Stress Concern Present (06/01/2021)   [place]    Feeling of Stress : Rather much  Social Connections: Socially Isolated (06/01/2021)   Social Connection and Isolation Panel [NHANES]    Frequency of Communication with Friends and Family: More than three times a week    Frequency of Social Gatherings with Friends and Family: More than three times a week    Attends Religious Services: Never    Marine scientist or Organizations: No    Attends Archivist Meetings: Never     Marital Status: Never married  Intimate Partner Violence: Not At Risk (06/01/2021)   Humiliation, Afraid, Rape, and Kick questionnaire    Fear of Current or Ex-Partner: No    Emotionally Abused: No    Physically Abused: No    Sexually Abused: No     Current Outpatient Medications:    Continuous Blood Gluc Receiver (FREESTYLE LIBRE 14 DAY READER) [place], 1 each by Does not apply route every 14 (fourteen) days., Disp: 1 each, Rfl: 4   Continuous Blood Gluc Sensor (FREESTYLE LIBRE 3 SENSOR) MISC, 1 each by Does not apply route every 14 (fourteen) days. Place 1 sensor on the skin every 14 days. Use to check glucose continuously, Disp: 1 each, Rfl: 4   tirzepatide (MOUNJARO) 2.5 MG/0.5ML Pen, Inject 2.5 mg into the skin once a week., Disp: 2 mL, Rfl: 3   Accu-Chek Softclix Lancets lancets, Use as instructed, Disp: 100 each, Rfl: 4   albuterol (VENTOLIN HFA) 108 (90 Base) MCG/ACT inhaler, Inhale 2 puffs into the lungs every 6 (six) hours as needed for wheezing or shortness of breath., Disp: 8 g, Rfl: 0   Cobalamin Combinations (B-12) [phone] MCG SUBL, daily., Disp: , Rfl:    fluticasone (FLONASE) 50 MCG/ACT nasal spray, Place 2 sprays into both nostrils daily., Disp: 11.1 mL, Rfl: 3   furosemide (LASIX) 20 MG tablet, Take 1 tablet by mouth once daily, Disp: 30 tablet, Rfl: 0   gabapentin (NEURONTIN) 100 MG capsule, TAKE 1 CAPSULE BY MOUTH TWICE DAILY FOR 7 DAYS THEN INCREASE TO 2 CAPS TWICE DAILY AND CONTINUE, Disp: , Rfl:    glimepiride (AMARYL) 4 MG tablet, Take 1 tablet (4 mg total) by mouth daily before breakfast., Disp: 30 tablet, Rfl: 3   glucose blood (ACCU-CHEK GUIDE) test strip, Use as directed, Disp: 50 each, Rfl: 4   loratadine (CLARITIN) 10 MG tablet, Take 10 mg by mouth daily., Disp: , Rfl:    potassium chloride SA (KLOR-CON M) 20 MEQ tablet, Take 1 tablet (20 mEq total) by mouth daily., Disp: 30 tablet, Rfl: 3   rosuvastatin (CRESTOR) 20 MG tablet, Take 1 tablet by mouth once daily,  Disp: 30 tablet, Rfl: 0   Allergies  Allergen Reactions   Codeine Itching and Anaphylaxis    ROS Review of Systems  Constitutional: Negative.   HENT: Negative.    Eyes: Negative.   Respiratory: Negative.    Cardiovascular: Negative.   Gastrointestinal: Negative.   Endocrine: Negative.   Genitourinary: Negative.   Musculoskeletal: Negative.   Skin: Negative.   Allergic/Immunologic: Negative.   Neurological: Negative.   Hematological: Negative.   Psychiatric/Behavioral: Negative.    All other  systems reviewed and are negative.     Objective:    Physical Exam Vitals reviewed.  Constitutional:      Appearance: Normal appearance.  HENT:     Mouth/Throat:     Mouth: Mucous membranes are moist.  Eyes:     Pupils: Pupils are equal, round, and reactive to light.  Neck:     Vascular: No carotid bruit.  Cardiovascular:     Rate and Rhythm: Normal rate and regular rhythm.     Pulses: Normal pulses.     Heart sounds: Normal heart sounds.  Pulmonary:     Effort: Pulmonary effort is normal.     Breath sounds: Normal breath sounds.  Abdominal:     General: Bowel sounds are normal.     Palpations: Abdomen is soft. There is no hepatomegaly, splenomegaly or mass.     Tenderness: There is no abdominal tenderness.     Hernia: No hernia is present.  Musculoskeletal:        General: No tenderness.     Cervical back: Neck supple.     Right lower leg: No edema.     Left lower leg: No edema.  Skin:    Findings: No rash.  Neurological:     Mental Status: She is alert and oriented to person, place, and time.     Motor: No weakness.  Psychiatric:        Mood and Affect: Mood and affect normal.        Behavior: Behavior normal.     BP 131/82   Pulse 90   Ht 5' 3"  (1.6 m)   Wt (!) 327 lb 14.4 oz (148.7 kg)   BMI 58.08 kg/m  Wt Readings from Last 3 Encounters:  10/10/21 (!) 327 lb 14.4 oz (148.7 kg)  09/26/21 (!) 328 lb 6.4 oz (149 kg)  09/13/21 (!) 329 lb 12.8 oz (149.6  kg)     Health Maintenance Due  Topic Date Due   HIV Screening  Never done   Diabetic kidney evaluation - Urine ACR  Never done   Hepatitis C Screening  Never done   Zoster Vaccines- Shingrix (1 of 2) Never done   COLONOSCOPY (Pts 45-45yr Insurance coverage will need to be confirmed)  Never done   TETANUS/TDAP  04/22/2016   COVID-19 Vaccine (3 - Moderna risk series) 05/13/2019    There are no preventive care reminders to display for this patient.  Lab Results  Component Value Date   TSH 4.22 07/26/2021   Lab Results  Component Value Date   WBC 5.9 07/26/2021   HGB 11.6 (L) 07/26/2021   HCT 37.2 07/26/2021   MCV 91.0 07/26/2021   PLT 208 07/26/2021   Lab Results  Component Value Date   NA 136 07/26/2021   K 4.2 07/26/2021   CO2 22 07/26/2021   GLUCOSE 290 (H) 07/26/2021   BUN 12 07/26/2021   CREATININE 0.63 07/26/2021   BILITOT 0.3 07/26/2021   ALKPHOS 109 05/05/2020   AST 14 07/26/2021   ALT 16 07/26/2021   PROT 6.4 07/26/2021   ALBUMIN 3.9 05/05/2020   CALCIUM 8.5 (L) 07/26/2021   ANIONGAP 5 06/23/2021   EGFR 107 07/26/2021   Lab Results  Component Value Date   CHOL 97 07/26/2021   Lab Results  Component Value Date   HDL 31 (L) 07/26/2021   Lab Results  Component Value Date   LDLCALC 27 07/26/2021   Lab Results  Component Value Date   TRIG  373 (H) 07/26/2021   Lab Results  Component Value Date   CHOLHDL 3.1 07/26/2021   Lab Results  Component Value Date   HGBA1C 8.5 09/26/2021      Assessment & Plan:   Problem List Items Addressed This Visit       Endocrine   Diabetes ([place]) - Primary    - The patient's blood sugar is labile on med. - The patient will continue the current treatment regimen.  - I encouraged the patient to regularly check blood sugar.  - I encouraged the patient to monitor diet. I encouraged the patient to eat low-carb and low-sugar to help prevent blood sugar spikes.  - I encouraged the patient to continue following  their prescribed treatment plan for diabetes - I informed the patient to get help if blood sugar drops below 71m/dL, or if suddenly have trouble thinking clearly or breathing.  Patient was advised to buy a book on diabetes from a local bookstore or from [place]  Patient should read 2 chapters every day to keep the motivation going, this is in addition to some of the materials we provided them from the office.  There are other resources on the Internet like YouTube and wilkipedia to get an education on the diabetes      Relevant Medications   tirzepatide ([facility] 2.5 MG/0.5ML Pen   Other Relevant Orders   POCT glucose (manual entry) (Completed)     Musculoskeletal and Integument   Trigger index finger of right hand    Stable        Other   Elevated triglycerides with high cholesterol    Hypercholesterolemia  I advised the patient to follow Mediterranean diet This diet is rich in fruits vegetables and whole grain, and This diet is also rich in fish and lean meat Patient should also eat a handful of almonds or walnuts daily Recent heart study indicated that average follow-up on this kind of diet reduces the cardiovascular mortality by 50 to 70%==      Class 3 severe obesity due to excess calories with serious comorbidity and body mass index (BMI) of 60.0 to 69.9 in adult ([facility]    We will start the patient on Mounjaro, lose weight      Relevant Medications   tirzepatide (MOUNJARO) 2.5 MG/0.5ML Pen   Status post bilateral mastectomy    Stable at the present time       Meds ordered this encounter  Medications   tirzepatide ([facility] 2.5 MG/0.5ML Pen    Sig: Inject 2.5 mg into the skin once a week.    Dispense:  2 mL    Refill:  3   Continuous Blood Gluc Sensor (FREESTYLE LIBRE 3 SENSOR) MISC    Sig: 1 each by Does not apply route every 14 (fourteen) days. Place 1 sensor on the skin every 14 days. Use to check glucose continuously    Dispense:  1 each    Refill:  4    Continuous Blood Gluc Receiver (FREESTYLE LIBRE 14 DAY READER) [place]    Sig: 1 each by Does not apply route every 14 (fourteen) days.    Dispense:  1 each    Refill:  4    Follow-up: No follow-ups on file.    JCletis Athens MD

## 2021-10-12 MED ORDER — DEXCOM G7 RECEIVER DEVI: 1.0000 | 4 refills | Status: DC

## 2021-10-12 MED ORDER — TRULICITY 0.75 MG/0.5ML ~~LOC~~ SOAJ: 0.7500 mg | SUBCUTANEOUS | 4 refills | Status: DC

## 2021-10-12 MED ORDER — DEXCOM G7 SENSOR MISC: 1.0000 | 4 refills | Status: DC

## 2021-10-12 NOTE — Addendum Note (Signed)
Addended by: Lacretia Nicks L on: 10/12/2021 09:09 AM   Modules accepted: Orders

## 2021-10-18 ENCOUNTER — Other Ambulatory Visit: Payer: Self-pay | Admitting: *Deleted

## 2021-10-18 MED ORDER — LINAGLIPTIN 5 MG PO TABS: 5.0000 mg | ORAL_TABLET | Freq: Every day | ORAL | 4 refills | Status: DC

## 2021-10-18 MED ORDER — DAPAGLIFLOZIN PROPANEDIOL 10 MG PO TABS: 10.0000 mg | ORAL_TABLET | Freq: Every day | ORAL | 4 refills | Status: DC

## 2021-10-27 ENCOUNTER — Other Ambulatory Visit: Payer: Self-pay | Admitting: *Deleted

## 2021-10-27 MED ORDER — BASAGLAR TEMPO PEN 100 UNIT/ML ~~LOC~~ SOPN: 20.0000 [IU] | PEN_INJECTOR | Freq: Two times a day (BID) | SUBCUTANEOUS | 6 refills | Status: DC

## 2021-11-03 ENCOUNTER — Other Ambulatory Visit: Payer: Self-pay | Admitting: *Deleted

## 2021-11-03 MED ORDER — TRULICITY 0.75 MG/0.5ML ~~LOC~~ SOAJ: 0.7500 mg | SUBCUTANEOUS | 4 refills | Status: DC

## 2021-11-10 ENCOUNTER — Other Ambulatory Visit: Payer: Self-pay | Admitting: Internal Medicine

## 2021-11-10 DIAGNOSIS — E119 Type 2 diabetes mellitus without complications: Secondary | ICD-10-CM

## 2021-11-17 ENCOUNTER — Inpatient Hospital Stay: Payer: 59 | Attending: Oncology | Admitting: Licensed Clinical Social Worker

## 2021-11-17 DIAGNOSIS — D0511 Intraductal carcinoma in situ of right breast: Secondary | ICD-10-CM

## 2021-11-17 NOTE — Progress Notes (Signed)
[facility] CSW Counseling Note  Patient was referred by medical provider. Treatment type: Individual  Presenting Concerns: Patient and/or family reports the following symptoms/concerns: anxiety, depression, stress, and adjsutment to diagnosis and treatment Duration of problem: 6 months; Severity of problem: moderate   Orientation:oriented to person, place, time/date, situation, day of week, month of year, and year.   Affect: Appropriate, Depressed, and Tearful Risk of harm to self or others: No plan to harm self or others  Patient and/or Family's Strengths/Protective Factors: Social connections, Social and Emotional competence, Concrete supports in place (healthy food, safe environments, etc.), Sense of purpose, and Physical Health (exercise, healthy diet, medication compliance, etc.)Ability for insight  Active sense of humor  Average or above average intelligence  Capable of independent living  Communication skills  Motivation for treatment/growth  Physical Health  Religious Affiliation  Special hobby/interest  Supportive family/friends  Work skills      Goals Addressed: Patient will:  Reduce symptoms of: anxiety, depression, stress, and adjustment concerns Increase knowledge and/or ability of: coping skills, healthy habits, self-management skills, and stress reduction  Increase healthy adjustment to current life circumstances, Increase adequate support systems for patient/family, Increase motivation to adhere to plan of care, and Begin healthy grieving over loss   Progress towards Goals: Progressing   Interventions: Interventions utilized:  CBT, Solution Focused, Strength-based, Assertiveness Training, Supportive, Reframing, and Other: ACT       Assessment: Patient currently experiencing anxiety, depression and adjustment difficulties to diagnosis and post-op recuperation. Patient reported feeling frustrated with health insurance plan, and concerns over whether she will be able to  afford reconstructive surgery.  Patient also reported anxiety due to "getting used to my body" specifically with swimming.  Patient expressed concerns over A1C results and adjustments to diet and life style chances for to qualify for upcoming surgery.  Discussed ways to improve communication with support system, mindfulness skills and body acceptance.  Discussed ways to access stress relief, including physical excessive, especially swimming which the patient enjoys. Discussed grieving process after surgery.      Plan: Follow up with CSW: 3 weeks 12/09/2021 @ 3:00PM Behavioral recommendations: continue using mindfulness skills, improve communication and acceptance skills Referral(s): Support group(s):  Breast Cancer Support Group         Adelene Amas, LCSW

## 2021-11-21 ENCOUNTER — Ambulatory Visit: Payer: 59 | Admitting: Internal Medicine

## 2021-12-06 ENCOUNTER — Other Ambulatory Visit: Payer: Self-pay | Admitting: Internal Medicine

## 2021-12-06 DIAGNOSIS — E119 Type 2 diabetes mellitus without complications: Secondary | ICD-10-CM

## 2021-12-09 ENCOUNTER — Ambulatory Visit: Payer: 59 | Admitting: Nurse Practitioner

## 2021-12-09 ENCOUNTER — Encounter: Payer: Self-pay | Admitting: Nurse Practitioner

## 2021-12-09 ENCOUNTER — Encounter: Payer: Self-pay | Admitting: Licensed Clinical Social Worker

## 2021-12-09 ENCOUNTER — Inpatient Hospital Stay: Payer: 59 | Admitting: Licensed Clinical Social Worker

## 2021-12-09 ENCOUNTER — Ambulatory Visit (INDEPENDENT_AMBULATORY_CARE_PROVIDER_SITE_OTHER): Payer: 59 | Admitting: Nurse Practitioner

## 2021-12-09 VITALS — BP 117/80 | HR 90 | Ht 63.0 in | Wt 328.5 lb

## 2021-12-09 DIAGNOSIS — E119 Type 2 diabetes mellitus without complications: Secondary | ICD-10-CM

## 2021-12-09 DIAGNOSIS — D0511 Intraductal carcinoma in situ of right breast: Secondary | ICD-10-CM

## 2021-12-09 LAB — POCT GLYCOSYLATED HEMOGLOBIN (HGB A1C): Hemoglobin A1C: 7.3 % — AB (ref 4.0–5.6)

## 2021-12-09 MED ORDER — DEXCOM G7 SENSOR MISC: 1.0000 | 6 refills | Status: DC

## 2021-12-09 NOTE — Progress Notes (Signed)
Established Patient Office Visit  Subjective:  Patient ID: Heather Cain, female    DOB: [birth year]  Age: 51 y.o. MRN: [id number]  CC:  Chief Complaint  Patient presents with   Diabetes    6 week Follow up. FBS this am 149.      HPI  Heather Cain presents for diabetes follow up.   HPI   Past Medical History:  Diagnosis Date   DCIS (ductal carcinoma in situ) of breast    Diabetes mellitus without complication (HCC)    Family history of bladder cancer    Family history of uterine cancer    High cholesterol    Neuromuscular disorder ([place])    peripheral neuropathy   PONV (postoperative nausea and vomiting)    Vaginal Pap smear, abnormal     Past Surgical History:  Procedure Laterality Date   BREAST BIOPSY Right 03/08/2021   right breast bx coil clip -positive   BREAST BIOPSY Right 03/24/2021   stereo biopsy x clip/DUCTAL CARCINOMA IN SITU (DCIS),   BREAST BIOPSY Left 05/18/2021   ribbon clip   BREAST BIOPSY Right 05/18/2021   coil clip   CRYOTHERAPY     EVACUATION BREAST HEMATOMA Right 06/16/2021   Procedure: EVACUATION HEMATOMA BREAST;  Surgeon: Ronny Bacon, MD;  Location: [facility] ORS;  Service: General;  Laterality: Right;   SIMPLE MASTECTOMY WITH AXILLARY SENTINEL NODE BIOPSY Right 06/15/2021   Procedure: SIMPLE MASTECTOMY WITH AXILLARY SENTINEL NODE BIOPSY, Total;  Surgeon: Ronny Bacon, MD;  Location: [facility] OR[facility];  Service: General;  Laterality: Right;   TOTAL MASTECTOMY Left 06/15/2021   Procedure: TOTAL MASTECTOMY, prophylactic left;  Surgeon: Ronny Bacon, MD;  Location: [facility] OR[facility];  Service: General;  Laterality: Left;   WRIST SURGERY      Family History  Problem Relation Age of Onset   Uterine cancer Mother 16   Bladder Cancer Father 55   Skin cancer Maternal Aunt    Cancer - Other Maternal Grandfather        possible colon cancer   Breast cancer Maternal Great-grandmother     Social History   Socioeconomic History   Marital status: Single     Spouse name: Not on file   Number of children: Not on file   Years of education: Not on file   Highest education level: Not on file  Occupational History   Not on file  Tobacco Use   Smoking status: Former    Packs/day: 1.00    Types: Cigarettes    Quit date: 05/30/2021    Years since quitting: 0.5   Smokeless tobacco: Never  Vaping Use   Vaping Use: Every day  Substance and Sexual Activity   Alcohol use: Yes    Comment: social   Drug use: Yes    Types: Marijuana   Sexual activity: Not Currently    Birth control/protection: None  Other Topics Concern   Not on file  Social History Narrative   Lives in [place]; self; no children; 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 former Smoke ; rare alcohol.    Social Determinants of Health   Financial Resource Strain: Low Risk  (06/01/2021)   Overall Financial Resource Strain (CARDIA)    Difficulty of Paying Living Expenses: Not hard at all  Food Insecurity: No Food Insecurity (06/01/2021)   Hunger Vital Sign    Worried About Running Out of Food in the Last Year: Never true    Ran Out of Food in the Last Year: Never true  Transportation Needs: No  Transportation Needs (06/01/2021)   PRAPARE - Hydrologist (Medical): No    Lack of Transportation (Non-Medical): No  Physical Activity: Inactive (06/01/2021)   Exercise Vital Sign    Days of Exercise per Week: 0 days    Minutes of Exercise per Session: 0 min  Stress: Stress Concern Present (06/01/2021)   [place]    Feeling of Stress : Rather much  Social Connections: Socially Isolated (06/01/2021)   Social Connection and Isolation Panel [NHANES]    Frequency of Communication with Friends and Family: More than three times a week    Frequency of Social Gatherings with Friends and Family: More than three times a week    Attends Religious Services: Never    Marine scientist or Organizations: No    Attends English as a second language teacher Meetings: Never    Marital Status: Never married  Intimate Partner Violence: Not At Risk (06/01/2021)   Humiliation, Afraid, Rape, and Kick questionnaire    Fear of Current or Ex-Partner: No    Emotionally Abused: No    Physically Abused: No    Sexually Abused: No     Outpatient Medications Prior to Visit  Medication Sig Dispense Refill   Accu-Chek Softclix Lancets lancets Use as instructed 100 each 4   albuterol (VENTOLIN HFA) 108 (90 Base) MCG/ACT inhaler Inhale 2 puffs into the lungs every 6 (six) hours as needed for wheezing or shortness of breath. 8 g 0   Cobalamin Combinations (B-12) [phone] MCG SUBL daily.     Dulaglutide (TRULICITY) 7.35 HG/9.9ME SOPN Inject 0.75 mg into the skin once a week. 0.5 mL 4   fluticasone (FLONASE) 50 MCG/ACT nasal spray Use 2 spray(s) in each nostril once daily 16 g 0   furosemide (LASIX) 20 MG tablet Take 1 tablet by mouth once daily 30 tablet 4   gabapentin (NEURONTIN) 100 MG capsule TAKE 1 CAPSULE BY MOUTH TWICE DAILY FOR 7 DAYS THEN INCREASE TO 2 CAPS TWICE DAILY AND CONTINUE     glucose blood (ACCU-CHEK GUIDE) test strip Use as directed 50 each 4   loratadine (CLARITIN) 10 MG tablet Take 10 mg by mouth daily.     potassium chloride SA (KLOR-CON M) 20 MEQ tablet Take 1 tablet (20 mEq total) by mouth daily. 30 tablet 3   rosuvastatin (CRESTOR) 20 MG tablet Take 1 tablet by mouth once daily 30 tablet 4   Continuous Blood Gluc Sensor (DEXCOM G7 SENSOR) MISC USE AS DIRECTED (Patient taking differently: 1 each by Other route. Every 10 days) 1 each 0   glimepiride (AMARYL) 4 MG tablet Take 1 tablet (4 mg total) by mouth daily before breakfast. 30 tablet 3   No facility-administered medications prior to visit.    Allergies  Allergen Reactions   Codeine Itching and Anaphylaxis    ROS Review of Systems  Constitutional: Negative.   HENT: Negative.    Eyes: Negative.   Respiratory:  Negative for chest tightness and shortness of  breath.   Cardiovascular:  Positive for leg swelling. Negative for chest pain and palpitations.  Gastrointestinal: Negative.   Genitourinary: Negative.   Musculoskeletal: Negative.   Neurological: Negative.   Psychiatric/Behavioral:  Negative for agitation, behavioral problems and confusion.       Objective:    Physical Exam Constitutional:      Appearance: Normal appearance. She is obese.  HENT:     Head: Normocephalic.  Right Ear: Tympanic membrane normal.     Left Ear: Tympanic membrane normal.     Nose: Nose normal.     Mouth/Throat:     Mouth: Mucous membranes are moist.     Pharynx: Oropharynx is clear.  Eyes:     Extraocular Movements: Extraocular movements intact.     Conjunctiva/sclera: Conjunctivae normal.     Pupils: Pupils are equal, round, and reactive to light.  Cardiovascular:     Rate and Rhythm: Normal rate and regular rhythm.     Pulses: Normal pulses.     Heart sounds: Normal heart sounds.  Pulmonary:     Effort: Pulmonary effort is normal. No respiratory distress.     Breath sounds: Normal breath sounds. No rhonchi.  Abdominal:     General: Bowel sounds are normal.     Palpations: Abdomen is soft. There is no mass.     Tenderness: There is no abdominal tenderness.     Hernia: No hernia is present.  Musculoskeletal:     Cervical back: Neck supple. No tenderness.     Right lower leg: Edema present.     Left lower leg: Edema present.  Skin:    General: Skin is warm.     Capillary Refill: Capillary refill takes less than 2 seconds.  Neurological:     General: No focal deficit present.     Mental Status: She is alert and oriented to person, place, and time. Mental status is at baseline.  Psychiatric:        Mood and Affect: Mood normal.        Behavior: Behavior normal.        Thought Content: Thought content normal.        Judgment: Judgment normal.     BP 117/80   Pulse 90   Ht _0  (1.6 m)   Wt (!) 328 lb 8 oz (149 kg)   SpO2 95%    BMI 58.19 kg/m  Wt Readings from Last 3 Encounters:  12/09/21 (!) 328 lb 8 oz (149 kg)  10/10/21 (!) 327 lb 14.4 oz (148.7 kg)  09/26/21 (!) 328 lb 6.4 oz (149 kg)     Health Maintenance  Topic Date Due   HIV Screening  Never done   Diabetic kidney evaluation - Urine ACR  Never done   Hepatitis C Screening  Never done   DTaP/Tdap/Td (2 - Td or Tdap) 04/22/2016   COVID-19 Vaccine (3 - Moderna risk series) 12/25/2021 (Originally 05/13/2019)   Zoster Vaccines- Shingrix (1 of 2) 03/10/2022 (Originally 07/25/1989)   COLONOSCOPY (Pts 45-79yr Insurance coverage will need to be confirmed)  12/10/2022 (Originally 07/26/2015)   OPHTHALMOLOGY EXAM  05/25/2022   HEMOGLOBIN A1C  06/10/2022   FOOT EXAM  07/23/2022   Diabetic kidney evaluation - eGFR measurement  07/27/2022   MAMMOGRAM  03/25/2023   PAP SMEAR-Modifier  05/06/2023   INFLUENZA VACCINE  Completed   HPV VACCINES  Aged Out    There are no preventive care reminders to display for this patient.  Lab Results  Component Value Date   TSH 4.22 07/26/2021   Lab Results  Component Value Date   WBC 5.9 07/26/2021   HGB 11.6 (L) 07/26/2021   HCT 37.2 07/26/2021   MCV 91.0 07/26/2021   PLT 208 07/26/2021   Lab Results  Component Value Date   NA 136 07/26/2021   K 4.2 07/26/2021   CO2 22 07/26/2021   GLUCOSE 290 (H) 07/26/2021   BUN  12 07/26/2021   CREATININE 0.63 07/26/2021   BILITOT 0.3 07/26/2021   ALKPHOS 109 05/05/2020   AST 14 07/26/2021   ALT 16 07/26/2021   PROT 6.4 07/26/2021   ALBUMIN 3.9 05/05/2020   CALCIUM 8.5 (L) 07/26/2021   ANIONGAP 5 06/23/2021   EGFR 107 07/26/2021   Lab Results  Component Value Date   CHOL 97 07/26/2021   Lab Results  Component Value Date   HDL 31 (L) 07/26/2021   Lab Results  Component Value Date   LDLCALC 27 07/26/2021   Lab Results  Component Value Date   TRIG 373 (H) 07/26/2021   Lab Results  Component Value Date   CHOLHDL 3.1 07/26/2021   Lab Results  Component  Value Date   HGBA1C 7.3 (A) 12/09/2021      Assessment & Plan:   Problem List Items Addressed This Visit       Endocrine   Diabetes ([place]) - Primary    Her hemoglobin A1c 7.3 in the office today. Advised pt to check the BS regularly, make a log and bring to next appointment.  Encouraged her to consume food including fruits, vegetables, whole grains, complex carbohydrates and proteins.  Continue the current medication regimen.       Relevant Medications   Continuous Blood Gluc Sensor (DEXCOM G7 SENSOR) MISC   Other Relevant Orders   POCT glycosylated hemoglobin (Hb A1C) (Completed)     Meds ordered this encounter  Medications   Continuous Blood Gluc Sensor (DEXCOM G7 SENSOR) MISC    Sig: 1 each by Other route as directed. Every 10 days    Dispense:  3 each    Refill:  6     Follow-up: No follow-ups on file.    Theresia Lo, NP

## 2021-12-09 NOTE — Progress Notes (Signed)
[place] CSW Progress Note  Clinical Education officer, museum contacted patient by phone to reschedule appointment.  CSW left voicemail with contact information and a request for return call.    Adelene Amas, LCSW

## 2021-12-19 ENCOUNTER — Telehealth: Payer: Self-pay | Admitting: Surgery

## 2021-12-19 ENCOUNTER — Other Ambulatory Visit: Payer: Self-pay | Admitting: Internal Medicine

## 2021-12-19 DIAGNOSIS — E119 Type 2 diabetes mellitus without complications: Secondary | ICD-10-CM

## 2021-12-19 NOTE — Telephone Encounter (Signed)
Spoke with patient- prefers to have referral to [facility] plastic. Marland Kitchen

## 2021-12-19 NOTE — Telephone Encounter (Signed)
Patient had bilateral mastectomy done 06/15/21 Dr. Christian Mate. A referral was placed for her to see [facility]. The patient has since been there twice for evaluation and now those doctors are not longer there, there is only one left.  Patient states she will not go back there, she feels like she has been given the run around with that practice.  She is asking if perhaps we can refer her to a plastic surgeon with [facility] and for that referral to be placed.  Please call her if we can do this.  Thank you.

## 2021-12-20 ENCOUNTER — Other Ambulatory Visit: Payer: Self-pay

## 2021-12-20 ENCOUNTER — Telehealth: Payer: Self-pay

## 2021-12-20 DIAGNOSIS — H66001 Acute suppurative otitis media without spontaneous rupture of ear drum, right ear: Secondary | ICD-10-CM | POA: Diagnosis not present

## 2021-12-20 DIAGNOSIS — Z9013 Acquired absence of bilateral breasts and nipples: Secondary | ICD-10-CM

## 2021-12-20 DIAGNOSIS — R059 Cough, unspecified: Secondary | ICD-10-CM | POA: Diagnosis not present

## 2021-12-20 NOTE — Telephone Encounter (Signed)
Referral faxed to Jerolyn Shin at [facility]. [phone] and provided contact information for [facility].    Left detailed message stating referral has been fax to the above and that patient can reach out to their office if she has not heard from anyone.

## 2021-12-21 NOTE — Assessment & Plan Note (Signed)
Her hemoglobin A1c 7.3 in the office today. Advised pt to check the BS regularly, make a log and bring to next appointment.  Encouraged her to consume food including fruits, vegetables, whole grains, complex carbohydrates and proteins.  Continue the current medication regimen.

## 2021-12-22 ENCOUNTER — Inpatient Hospital Stay: Payer: 59 | Admitting: Licensed Clinical Social Worker

## 2022-01-05 ENCOUNTER — Inpatient Hospital Stay: Payer: 59 | Attending: Oncology | Admitting: Licensed Clinical Social Worker

## 2022-01-05 DIAGNOSIS — D0511 Intraductal carcinoma in situ of right breast: Secondary | ICD-10-CM

## 2022-01-05 NOTE — Progress Notes (Signed)
[place] CSW Counseling Note  Patient was referred by medical provider. Treatment type: Individual  Presenting Concerns: Patient and/or family reports the following symptoms/concerns: anxiety, stress, and adjustment to diagnosis and treatment Duration of problem: 7 months; Severity of problem: moderate   Orientation:oriented to person, place, time/date, situation, day of week, month of year, and year.   Affect: Tearful Risk of harm to self or others: No plan to harm self or others  Patient and/or Family's Strengths/Protective Factors: Social connections, Social and Emotional competence, Concrete supports in place (healthy food, safe environments, etc.), Sense of purpose, Physical Health (exercise, healthy diet, medication compliance, etc.), and Caregiver has knowledge of parenting & child developmentAbility for insight  Active sense of humor  Average or above average intelligence  Capable of independent living  Engineer, drilling fund of knowledge  Motivation for treatment/growth  Physical Health  Special hobby/interest  Supportive family/friends  Work skills      Goals Addressed: Patient will:  Reduce symptoms of: anxiety, stress, and adjustment concerns Increase knowledge and/or ability of: coping skills, healthy habits, self-management skills, stress reduction, and communication   Increase healthy adjustment to current life circumstances, Increase adequate support systems for patient/family, Begin healthy grieving over loss, and increase physical health    Progress towards Goals: Progressing   Interventions: Interventions utilized:  CBT, Solution Focused, Strength-based, Supportive, Family Systems, Reframing, and Other: ACT       Assessment: Patient currently experiencing  anxiety, mild symptoms of depression and adjustment difficulties to diagnosis and post-op recuperation. Patient reported feeling some stress due to family gatherings during the  holidays. Patient reported some frustration with changes at the surgeon's office and delay with post-op reconstructive surgery. Dicussed medical advocacy for addressing concerns. Discussed ways to improve communication with support system, mindfulness skills and body acceptance.  Discussed ways to access stress relief, including physical excessive, especially swimming which the patient enjoys. Discussed grieving process after surgery.    .      Plan: Follow up with CSW: 2 weeks  01/19/2022 @ 3:00 Behavioral recommendations: continue using mindfulness skills, improve communication and acceptance skills  Referral(s): Support group(s):  N/A         Adelene Amas, LCSW   Patient is participating in a Managed Medicaid Plan:  Yes

## 2022-01-10 ENCOUNTER — Telehealth: Payer: Self-pay

## 2022-01-10 NOTE — Telephone Encounter (Signed)
Patient states new insurance requires a prior authorization for her DEXCOM Glucose monitor and for her trulicity. Wondering if we can start this so there is no gap in care.

## 2022-01-11 ENCOUNTER — Telehealth: Payer: Self-pay

## 2022-01-11 ENCOUNTER — Other Ambulatory Visit (HOSPITAL_COMMUNITY): Payer: Self-pay

## 2022-01-11 NOTE — Telephone Encounter (Signed)
PA pending. New telephone encounters for both PAs

## 2022-01-11 NOTE — Telephone Encounter (Signed)
Pharmacy Patient Advocate Encounter   Received notification that prior authorization for Trulicity 0.'75MG'$ /0.5ML pen-injectors is required/requested.  Per Test Claim: Refill too soon on primary insurance Scientist, clinical (histocompatibility and immunogenetics)). PA required on secondary insurance ([employer])   PA submitted on 01/11/22 to (ins) [place] via [employer] [phone] Status is pending

## 2022-01-11 NOTE — Telephone Encounter (Signed)
Pharmacy Patient Advocate Encounter   Received notification that prior authorization for Dexcom G7 Sensor is required/requested.  Per Test Claim: Refill too soon on primary insurance Scientist, clinical (histocompatibility and immunogenetics)). PA required on secondary insurance ([employer])    PA submitted on 01/11/22 to (ins) [place] via CoverMyMeds [place] Status is pending

## 2022-01-16 ENCOUNTER — Other Ambulatory Visit (HOSPITAL_COMMUNITY): Payer: Self-pay

## 2022-01-16 NOTE — Telephone Encounter (Signed)
Patient aware.

## 2022-01-16 NOTE — Telephone Encounter (Signed)
Patient is calling in stating the ONLY insurance she has is [employer]. States Holland Falling was last active on 01/08/22. States it specifically needs to state she tried Metformin and had severe adverse effects was placed on Glimepiride which helped up until she had bilateral mastectomy due to breast cancer and it then raised her blood sugar. Attempted Ozempic but it was not covered under [employer] so Dr.Masoud put her on Trulicity and the Dexcom which has currently helped.

## 2022-01-16 NOTE — Telephone Encounter (Signed)
Patient Advocate Encounter  Prior Authorization for Trulicity 0.'75MG'$ /0.5ML pen-injectors has been approved.    Effective dates: 01/11/22 through 01/12/23     Approval letter attached to charts

## 2022-01-16 NOTE — Telephone Encounter (Addendum)
Patient is calling in stating the ONLY insurance she has is [employer]. States Holland Falling was last active on 01/08/22. States it specifically needs to state she tried Metformin and had severe adverse effects was placed on Glimepiride which helped up until she had bilateral mastectomy due to breast cancer and it then raised her blood sugar. Attempted Ozempic but it was not covered under [employer] so Dr.Masoud put her on Trulicity and the Dexcom which has currently helped.

## 2022-01-16 NOTE — Telephone Encounter (Signed)
Pharmacy Patient Advocate Encounter  Received notification from [place] that the request for prior authorization for Dexcom G7 Sensor has been denied due to not meeting policy requirements.      Denial letter attached to charts

## 2022-01-18 ENCOUNTER — Ambulatory Visit: Payer: Medicaid Other | Admitting: Plastic Surgery

## 2022-01-18 ENCOUNTER — Encounter: Payer: Self-pay | Admitting: Plastic Surgery

## 2022-01-18 VITALS — BP 124/82 | HR 95 | Ht 63.0 in | Wt 324.8 lb

## 2022-01-18 DIAGNOSIS — Z9013 Acquired absence of bilateral breasts and nipples: Secondary | ICD-10-CM

## 2022-01-18 DIAGNOSIS — D0511 Intraductal carcinoma in situ of right breast: Secondary | ICD-10-CM

## 2022-01-18 NOTE — Progress Notes (Signed)
Heather Cain returns today for discussion of breast reconstruction after bilateral mastectomies performed in June 2023.  The patient was initially seen and reconstruction delayed due to her smoking.  She was subsequently seen and reconstruction delayed 1 due to concern that she may not receive the best reconstruction possible due to her size and 2 due to an A1c of 8.5.  Since her last appointment she has been diligently working on bringing her blood sugars back under control and her A1c is now 7.4.  She is still not decided if she wants to proceed with reconstruction but was interested in additional information.  We spent 30 minutes discussing the technique for a tissue expander and implant-based reconstruction.  We discussed the use of ADM both above and below the muscle and the reason why I prefer to use a submuscular approach.  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  We discussed the fact that implants do not have to be removed every 10 years however the warrantee terminates generally at the end of 10 years but that the implants need to be interrogated every 2 to 3 years to ensure no rupture has occurred.  We did discuss the surgical risks of bleeding and infection and how infection with either the tissue expander or the implant typically causes the entire reconstruction to be lost.  She understands currently it is very unlikely that she would be a candidate for autologous tissue reconstruction due to her weight.  She is actively working on weight loss using the new weight loss medications.  I, much like the last surgeon, have concerns that I would be able to achieve an implant-based reconstruction that would give her a breast that was proportionate to the rest of her body habitus.  She fully understands this as well.  She would like to consider these  options and our discussion and will return to let me know how she would like to proceed.

## 2022-01-22 ENCOUNTER — Other Ambulatory Visit: Payer: Self-pay | Admitting: Internal Medicine

## 2022-01-22 DIAGNOSIS — E119 Type 2 diabetes mellitus without complications: Secondary | ICD-10-CM

## 2022-02-01 ENCOUNTER — Ambulatory Visit: Payer: Medicaid Other | Admitting: Dermatology

## 2022-02-01 VITALS — BP 128/82 | HR 73

## 2022-02-01 DIAGNOSIS — Z1283 Encounter for screening for malignant neoplasm of skin: Secondary | ICD-10-CM | POA: Diagnosis not present

## 2022-02-01 DIAGNOSIS — L820 Inflamed seborrheic keratosis: Secondary | ICD-10-CM | POA: Diagnosis not present

## 2022-02-01 DIAGNOSIS — B079 Viral wart, unspecified: Secondary | ICD-10-CM | POA: Diagnosis not present

## 2022-02-01 DIAGNOSIS — D229 Melanocytic nevi, unspecified: Secondary | ICD-10-CM | POA: Diagnosis not present

## 2022-02-01 DIAGNOSIS — Z79899 Other long term (current) drug therapy: Secondary | ICD-10-CM | POA: Diagnosis not present

## 2022-02-01 DIAGNOSIS — L821 Other seborrheic keratosis: Secondary | ICD-10-CM | POA: Diagnosis not present

## 2022-02-01 DIAGNOSIS — L219 Seborrheic dermatitis, unspecified: Secondary | ICD-10-CM

## 2022-02-01 DIAGNOSIS — B078 Other viral warts: Secondary | ICD-10-CM | POA: Diagnosis not present

## 2022-02-01 DIAGNOSIS — L814 Other melanin hyperpigmentation: Secondary | ICD-10-CM | POA: Diagnosis not present

## 2022-02-01 DIAGNOSIS — I872 Venous insufficiency (chronic) (peripheral): Secondary | ICD-10-CM | POA: Diagnosis not present

## 2022-02-01 DIAGNOSIS — D1801 Hemangioma of skin and subcutaneous tissue: Secondary | ICD-10-CM

## 2022-02-01 DIAGNOSIS — L578 Other skin changes due to chronic exposure to nonionizing radiation: Secondary | ICD-10-CM

## 2022-02-01 DIAGNOSIS — A630 Anogenital (venereal) warts: Secondary | ICD-10-CM

## 2022-02-01 MED ORDER — KETOCONAZOLE 2 % EX SHAM: MEDICATED_SHAMPOO | CUTANEOUS | 3 refills | Status: DC

## 2022-02-01 MED ORDER — KETOCONAZOLE 2 % EX CREA: TOPICAL_CREAM | CUTANEOUS | 0 refills | Status: DC

## 2022-02-01 MED ORDER — HYDROCORTISONE 2.5 % EX CREA: TOPICAL_CREAM | CUTANEOUS | 11 refills | Status: DC

## 2022-02-01 MED ORDER — MOMETASONE FUROATE 0.1 % EX CREA: TOPICAL_CREAM | CUTANEOUS | 6 refills | Status: DC

## 2022-02-01 NOTE — Patient Instructions (Addendum)
Rash on face and scalp  Start Ketoconazole shampoo apply three times per week, massage into scalp and leave in for 10 minutes before rinsing out  Start Ketoconazole cream apply to face once a day Mon, Wed, Fri  Start Hydrocortisone 2.5% cream  apply to face once a day Tues, Thurs, Sat   Rash on legs Start Mometasone cream apply to legs 5 nights a week  Start graduated compression stocking   Topical steroids (such as triamcinolone, fluocinolone, fluocinonide, mometasone, clobetasol, halobetasol, betamethasone, hydrocortisone) can cause thinning and lightening of the skin if they are used for too long in the same area. Your physician has selected the right strength medicine for your problem and area affected on the body. Please use your medication only as directed by your physician to prevent side effects.      Cryotherapy Aftercare  Wash gently with soap and water everyday.   Apply Vaseline and Band-Aid daily until healed.     Due to recent changes in healthcare laws, you may see results of your pathology and/or laboratory studies on MyChart before the doctors have had a chance to review them. We understand that in some cases there may be results that are confusing or concerning to you. Please understand that not all results are received at the same time and often the doctors may need to interpret multiple results in order to provide you with the best plan of care or course of treatment. Therefore, we ask that you please give [place] 2 business days to thoroughly review all your results before contacting the office for clarification. Should we see a critical lab result, you will be contacted sooner.   If You Need Anything After Your Visit  If you have any questions or concerns for your doctor, please call our main line at [phone] and press option 4 to reach your doctor's medical assistant. If no one answers, please leave a voicemail as directed and we will return your call as soon as  possible. Messages left after 4 pm will be answered the following business day.   You may also send [place] a message via [place]. We typically respond to MyChart messages within 1-2 business days.  For prescription refills, please ask your pharmacy to contact our office. Our fax number is [phone].  If you have an urgent issue when the clinic is closed that cannot wait until the next business day, you can page your doctor at the number below.    Please note that while we do our best to be available for urgent issues outside of office hours, we are not available 24/7.   If you have an urgent issue and are unable to reach [place], you may choose to seek medical care at your doctor's office, retail clinic, urgent care center, or emergency room.  If you have a medical emergency, please immediately call 911 or go to the emergency department.  Pager Numbers  - Dr. Nehemiah Massed: [phone]  - Dr. Laurence Ferrari: [phone]  - Dr. Nicole Kindred: [phone]  In the event of inclement weather, please call our main line at [phone] for an update on the status of any delays or closures.  Dermatology Medication Tips: Please keep the boxes that topical medications come in in order to help keep track of the instructions about where and how to use these. Pharmacies typically print the medication instructions only on the boxes and not directly on the medication tubes.   If your medication is too expensive, please contact our office at [phone]  4 or send us a message through MyChart.   We are unable to tell what your co-pay for medications will be in advance as this is different depending on your insurance coverage. However, we may be able to find a substitute medication at lower cost or fill out paperwork to get insurance to cover a needed medication.   If a prior authorization is required to get your medication covered by your insurance company, please allow us 1-2 business days to complete this  process.  Drug prices often vary depending on where the prescription is filled and some pharmacies may offer cheaper prices.  The website www.goodrx.com contains coupons for medications through different pharmacies. The prices here do not account for what the cost may be with help from insurance (it may be cheaper with your insurance), but the website can give you the price if you did not use any insurance.  - You can print the associated coupon and take it with your prescription to the pharmacy.  - You may also stop by our office during regular business hours and pick up a GoodRx coupon card.  - If you need your prescription sent electronically to a different pharmacy, notify our office through [place] MyChart or by phone at [phone] option 4.     Si Usted Necesita Algo Despus de Su Visita  Tambin puede enviarnos un mensaje a travs de MyChart. Por lo general respondemos a los mensajes de MyChart en el transcurso de 1 a 2 das hbiles.  Para renovar recetas, por favor pida a su farmacia que se ponga en contacto con nuestra oficina. Nuestro nmero de fax es el [phone].  Si tiene un asunto urgente cuando la clnica est cerrada y que no puede esperar hasta el siguiente da hbil, puede llamar/localizar a su doctor(a) al nmero que aparece a continuacin.   Por favor, tenga en cuenta que aunque hacemos todo lo posible para estar disponibles para asuntos urgentes fuera del horario de oficina, no estamos disponibles las 24 horas del da, los 7 das de la semana.   Si tiene un problema urgente y no puede comunicarse con nosotros, puede optar por buscar atencin mdica  en el consultorio de su doctor(a), en una clnica privada, en un centro de atencin urgente o en una sala de emergencias.  Si tiene una emergencia mdica, por favor llame inmediatamente al 911 o vaya a la sala de emergencias.  Nmeros de bper  - Dr. Kowalski: [phone]  - Dra. Moye: [phone]  - Dra.  Stewart: [phone]  En caso de inclemencias del tiempo, por favor llame a nuestra lnea principal al [phone] para una actualizacin sobre el estado de cualquier retraso o cierre.  Consejos para la medicacin en dermatologa: Por favor, guarde las cajas en las que vienen los medicamentos de uso tpico para ayudarle a seguir las instrucciones sobre dnde y cmo usarlos. Las farmacias generalmente imprimen las instrucciones del medicamento slo en las cajas y no directamente en los tubos del medicamento.   Si su medicamento es muy caro, por favor, pngase en contacto con nuestra oficina llamando al [phone] y presione la opcin 4 o envenos un mensaje a travs de MyChart.   No podemos decirle cul ser su copago por los medicamentos por adelantado ya que esto es diferente dependiendo de la cobertura de su seguro. Sin embargo, es posible que podamos encontrar un medicamento sustituto a menor costo o llenar un formulario para que el seguro cubra el medicamento que se considera necesario.     Si se requiere una autorizacin previa para que su compaa de seguros [place] su medicamento, por favor permtanos de 1 a 2 das hbiles para completar este proceso.  Los precios de los medicamentos varan con frecuencia dependiendo del Environmental consultant de dnde se surte la receta y alguna farmacias pueden ofrecer precios ms baratos.  El sitio web www.goodrx.com tiene cupones para medicamentos de Airline pilot. Los precios aqu no tienen en cuenta lo que podra costar con la ayuda del seguro (puede ser ms barato con su seguro), pero el sitio web puede darle el precio si no utiliz Research scientist (physical sciences).  - Puede imprimir el cupn correspondiente y llevarlo con su receta a la farmacia.  - Tambin puede pasar por nuestra oficina durante el horario de atencin regular y Charity fundraiser una tarjeta de cupones de GoodRx.  - Si necesita que su receta se enve electrnicamente a una farmacia diferente, informe a nuestra oficina a  travs de MyChart de [place] o por telfono llamando al [phone] y presione la opcin 4.

## 2022-02-01 NOTE — Progress Notes (Signed)
New Patient Visit  Subjective  Heather Cain is a 52 y.o. female who presents for the following: Skin Problem. Hx of right breast cancer. The patient presents for Total-Body Skin Exam (TBSE) for skin cancer screening and mole check.  The patient has spots, moles and lesions to be evaluated, some may be new or changing and the patient has concerns that these could be cancer.   The following portions of the chart were reviewed this encounter and updated as appropriate:   Tobacco  Allergies  Meds  Problems  Med Hx  Surg Hx  Fam Hx     Review of Systems:  No other skin or systemic complaints except as noted in HPI or Assessment and Plan.  Objective  Well appearing patient in no apparent distress; mood and affect are within normal limits.  A full examination was performed including scalp, head, eyes, ears, nose, lips, neck, chest, axillae, abdomen, back, buttocks, bilateral upper extremities, bilateral lower extremities, hands, feet, fingers, toes, fingernails, and toenails. All findings within normal limits unless otherwise noted below.  face and scalp Pink patches with greasy scale.   back x 2, right pretibial x 5  (7) (7) Stuck-on, waxy, tan-brown papules -- Discussed benign etiology and prognosis.   lower legs Erythematous, scaly patches involving the ankle and distal lower leg with associated lower leg edema.   right thumb x 2 (2) Verrucous papules -- Discussed viral etiology and contagion.   right superior labial x 2 (2) Verrucous papules -- Discussed viral etiology and contagion.    Assessment & Plan  Seborrheic dermatitis face and scalp Seborrheic Dermatitis  -  is a chronic persistent rash characterized by pinkness and scaling most commonly of the mid face but also can occur on the scalp (dandruff), ears; mid chest, mid back and groin.  It tends to be exacerbated by stress and cooler weather.  People who have neurologic disease may experience new onset or  exacerbation of existing seborrheic dermatitis.  The condition is not curable but treatable and can be controlled.   Start Ketoconazole shampoo apply three times per week, massage into scalp and leave in for 10 minutes before rinsing out  Start Ketoconazole cream apply to face once a day Mon, Wed, Fri  Start Hydrocortisone 2.5% cream  apply to face once a day Tues, Thurs, Sat   Related Medications ketoconazole (NIZORAL) 2 % shampoo apply three times per week, massage into scalp and leave in for 10 minutes before rinsing out  ketoconazole (NIZORAL) 2 % cream apply to face once a day Mon, Wed, Fri hydrocortisone 2.5 % cream apply to face once a day Tues, Thurs, Sat  Inflamed seborrheic keratosis (7) back x 2, right pretibial x 5  (7) Symptomatic, irritating, patient would like treated.  Destruction of lesion - back x 2, right pretibial x 5  (7) Complexity: simple   Destruction method: cryotherapy   Informed consent: discussed and consent obtained   Timeout:  patient name, date of birth, surgical site, and procedure verified Lesion destroyed using liquid nitrogen: Yes   Region frozen until ice ball extended beyond lesion: Yes   Outcome: patient tolerated procedure well with no complications   Post-procedure details: wound care instructions given    Venous stasis dermatitis of both lower extremities lower legs Stasis in the legs causes chronic leg swelling, which may result in itchy or painful rashes, skin discoloration, skin texture changes, and sometimes ulceration.  Recommend daily graduated compression hose/stockings- easiest to put  on first thing in morning, remove at bedtime.  Elevate legs as much as possible. Avoid salt/sodium rich foods.   Start Mometasone cream apply to legs 5 nights a week Topical steroids (such as triamcinolone, fluocinolone, fluocinonide, mometasone, clobetasol, halobetasol, betamethasone, hydrocortisone) can cause thinning and lightening of the skin if they  are used for too long in the same area. Your physician has selected the right strength medicine for your problem and area affected on the body. Please use your medication only as directed by your physician to prevent side effects.    Related Medications mometasone (ELOCON) 0.1 % cream apply to legs 5 nights a week  Other viral warts (2) right thumb x 2 Viral Wart (HPV) Counseling  Discussed viral / HPV (Human Papilloma Virus) etiology and risk of spread /infectivity to other areas of body as well as to other people.  Multiple treatments and methods may be required to clear warts and it is possible treatment may not be successful.  Treatment risks include discoloration; scarring and there is still potential for wart recurrence.   Destruction of lesion - right thumb x 2 Complexity: simple   Destruction method: cryotherapy   Informed consent: discussed and consent obtained   Timeout:  patient name, date of birth, surgical site, and procedure verified Lesion destroyed using liquid nitrogen: Yes   Region frozen until ice ball extended beyond lesion: Yes   Outcome: patient tolerated procedure well with no complications   Post-procedure details: wound care instructions given    Verruca (2) right superior labial x 2 Wart vs Condyloma  Viral Wart (HPV) Counseling  Discussed viral / HPV (Human Papilloma Virus) etiology and risk of spread /infectivity to other areas of body as well as to other people.  Multiple treatments and methods may be required to clear warts and it is possible treatment may not be successful.  Treatment risks include discoloration; scarring and there is still potential for wart recurrence.   Recommend yearly GYN exams, patient report she has yearly exams and she recently had her GYN exam and PAP smear was "normal" without HPV.  Destruction of lesion - right superior labial x 2 Complexity: simple   Destruction method: cryotherapy   Informed consent: discussed and consent  obtained   Timeout:  patient name, date of birth, surgical site, and procedure verified Lesion destroyed using liquid nitrogen: Yes   Region frozen until ice ball extended beyond lesion: Yes   Outcome: patient tolerated procedure well with no complications   Post-procedure details: wound care instructions given    Lentigines - Scattered tan macules - Due to sun exposure - Benign-appearing, observe - Recommend daily broad spectrum sunscreen SPF 30+ to sun-exposed areas, reapply every 2 hours as needed. - Call for any changes  Seborrheic Keratoses - Stuck-on, waxy, tan-brown papules and/or plaques  - Benign-appearing - Discussed benign etiology and prognosis. - Observe - Call for any changes  Melanocytic Nevi - Tan-brown and/or pink-flesh-colored symmetric macules and papules - Benign appearing on exam today - Observation - Call clinic for new or changing moles - Recommend daily use of broad spectrum spf 30+ sunscreen to sun-exposed areas.   Hemangiomas - Red papules - Discussed benign nature - Observe - Call for any changes  Actinic Damage - Chronic condition, secondary to cumulative UV/sun exposure - diffuse scaly erythematous macules with underlying dyspigmentation - Recommend daily broad spectrum sunscreen SPF 30+ to sun-exposed areas, reapply every 2 hours as needed.  - Staying in the shade or wearing  long sleeves, sun glasses (UVA+UVB protection) and wide brim hats (4-inch brim around the entire circumference of the hat) are also recommended for sun protection.  - Call for new or changing lesions.  Hx or breast cancer  Right breast /2023 Double mastectomy   Skin cancer screening performed today.   Return in about 3 months (around 05/03/2022) for seborrheic dermatitis, warts, stasis dermatitis .  IMarye Round, CMA, am acting as scribe for Sarina Ser, MD .  Documentation: I have reviewed the above documentation for accuracy and completeness, and I agree  with the above.  Sarina Ser, MD

## 2022-02-09 ENCOUNTER — Encounter: Payer: Self-pay | Admitting: Dermatology

## 2022-02-10 ENCOUNTER — Ambulatory Visit: Payer: 59 | Admitting: Nurse Practitioner

## 2022-02-10 ENCOUNTER — Ambulatory Visit: Payer: Medicaid Other | Admitting: Nurse Practitioner

## 2022-02-10 ENCOUNTER — Encounter: Payer: Self-pay | Admitting: Nurse Practitioner

## 2022-02-10 VITALS — BP 118/78 | HR 94 | Temp 98.4°F | Ht 63.0 in | Wt 322.4 lb

## 2022-02-10 DIAGNOSIS — E785 Hyperlipidemia, unspecified: Secondary | ICD-10-CM | POA: Diagnosis not present

## 2022-02-10 DIAGNOSIS — Z23 Encounter for immunization: Secondary | ICD-10-CM | POA: Diagnosis not present

## 2022-02-10 DIAGNOSIS — R609 Edema, unspecified: Secondary | ICD-10-CM

## 2022-02-10 DIAGNOSIS — E119 Type 2 diabetes mellitus without complications: Secondary | ICD-10-CM

## 2022-02-10 NOTE — Progress Notes (Signed)
Established Patient Office Visit  Subjective:  Patient ID: Heather Cain, female    DOB: [birth year]  Age: 52 y.o. MRN: [id number]  CC:  Chief Complaint  Patient presents with   Diabetes   HPI  Heather Cain presents for routine follow up. She has h/o breast cancer with double mastectomy,  diabetes, hyperlipemia, lower leg edema. The fasting BS at home 140-160.  She is taking Trulicity and glimepiride.  Her last hemoglobin A1c was 7.3 on 12/09/2021. Denies shortness of breath,  chest pain or headache at present.   HPI   Past Medical History:  Diagnosis Date   DCIS (ductal carcinoma in situ) of breast    Diabetes mellitus without complication (HCC)    Family history of bladder cancer    Family history of uterine cancer    High cholesterol    Neuromuscular disorder ([place])    peripheral neuropathy   PONV (postoperative nausea and vomiting)    Vaginal Pap smear, abnormal     Past Surgical History:  Procedure Laterality Date   BREAST BIOPSY Right 03/08/2021   right breast bx coil clip -positive   BREAST BIOPSY Right 03/24/2021   stereo biopsy x clip/DUCTAL CARCINOMA IN SITU (DCIS),   BREAST BIOPSY Left 05/18/2021   ribbon clip   BREAST BIOPSY Right 05/18/2021   coil clip   CRYOTHERAPY     EVACUATION BREAST HEMATOMA Right 06/16/2021   Procedure: EVACUATION HEMATOMA BREAST;  Surgeon: Ronny Bacon, MD;  Location: [facility];  Service: General;  Laterality: Right;   SIMPLE MASTECTOMY WITH AXILLARY SENTINEL NODE BIOPSY Right 06/15/2021   Procedure: SIMPLE MASTECTOMY WITH AXILLARY SENTINEL NODE BIOPSY, Total;  Surgeon: Ronny Bacon, MD;  Location: [facility];  Service: General;  Laterality: Right;   TOTAL MASTECTOMY Left 06/15/2021   Procedure: TOTAL MASTECTOMY, prophylactic left;  Surgeon: Ronny Bacon, MD;  Location: [facility];  Service: General;  Laterality: Left;   WRIST SURGERY      Family History  Problem Relation Age of Onset   Uterine cancer Mother 59   Bladder  Cancer Father 28   Skin cancer Maternal Aunt    Cancer - Other Maternal Grandfather        possible colon cancer   Breast cancer Maternal Great-grandmother     Social History   Socioeconomic History   Marital status: Single    Spouse name: Not on file   Number of children: Not on file   Years of education: Not on file   Highest education level: Not on file  Occupational History   Not on file  Tobacco Use   Smoking status: Former    Packs/day: 1.00    Types: Cigarettes    Quit date: 05/30/2021    Years since quitting: 0.7   Smokeless tobacco: Never  Vaping Use   Vaping Use: Every day  Substance and Sexual Activity   Alcohol use: Yes    Comment: social   Drug use: Yes    Types: Marijuana   Sexual activity: Not Currently    Birth control/protection: None  Other Topics Concern   Not on file  Social History Narrative   Lives in [place]; self; no children; 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 former Smoke ; rare alcohol.    Social Determinants of Health   Financial Resource Strain: Medium Risk (01/05/2022)   Overall Financial Resource Strain (CARDIA)    Difficulty of Paying Living Expenses: Somewhat hard  Food Insecurity: No Food Insecurity (01/05/2022)   Hunger Vital Sign  Worried About Charity fundraiser in the Last Year: Never true    [place] in the Last Year: Never true  Transportation Needs: No Transportation Needs (01/05/2022)   PRAPARE - Hydrologist (Medical): No    Lack of Transportation (Non-Medical): No  Physical Activity: Inactive (01/05/2022)   Exercise Vital Sign    Days of Exercise per Week: 0 days    Minutes of Exercise per Session: 0 min  Stress: Stress Concern Present (01/05/2022)   [place]    Feeling of Stress : To some extent  Social Connections: Socially Isolated (01/05/2022)   Social Connection and Isolation Panel [NHANES]    Frequency of Communication with  Friends and Family: More than three times a week    Frequency of Social Gatherings with Friends and Family: More than three times a week    Attends Religious Services: Never    Marine scientist or Organizations: No    Attends Archivist Meetings: Never    Marital Status: Never married  Intimate Partner Violence: Not At Risk (01/05/2022)   Humiliation, Afraid, Rape, and Kick questionnaire    Fear of Current or Ex-Partner: No    Emotionally Abused: No    Physically Abused: No    Sexually Abused: No     Outpatient Medications Prior to Visit  Medication Sig Dispense Refill   albuterol (VENTOLIN HFA) 108 (90 Base) MCG/ACT inhaler Inhale 2 puffs into the lungs every 6 (six) hours as needed for wheezing or shortness of breath. 8 g 0   Cobalamin Combinations (B-12) [phone] MCG SUBL daily.     Dulaglutide (TRULICITY) A999333 0000000 SOPN Inject 0.75 mg into the skin once a week. 0.5 mL 4   furosemide (LASIX) 20 MG tablet Take 1 tablet by mouth once daily 30 tablet 4   gabapentin (NEURONTIN) 100 MG capsule TAKE 1 CAPSULE BY MOUTH TWICE DAILY FOR 7 DAYS THEN INCREASE TO 2 CAPS TWICE DAILY AND CONTINUE     hydrocortisone 2.5 % cream apply to face once a day Tues, Thurs, Sat 30 g 11   ketoconazole (NIZORAL) 2 % cream apply to face once a day Mon, Wed, Fri 15 g 0   ketoconazole (NIZORAL) 2 % shampoo apply three times per week, massage into scalp and leave in for 10 minutes before rinsing out 120 mL 3   loratadine (CLARITIN) 10 MG tablet Take 10 mg by mouth daily.     mometasone (ELOCON) 0.1 % cream apply to legs 5 nights a week 45 g 6   Accu-Chek Softclix Lancets lancets Use as instructed 100 each 4   Continuous Blood Gluc Sensor (DEXCOM G7 SENSOR) MISC 1 each by Other route as directed. Every 10 days 3 each 6   fluticasone (FLONASE) 50 MCG/ACT nasal spray Use 2 spray(s) in each nostril once daily 16 g 0   glimepiride (AMARYL) 4 MG tablet TAKE 1 TABLET BY MOUTH ONCE DAILY BEFORE  BREAKFAST 90 tablet 1   glucose blood (ACCU-CHEK GUIDE) test strip Use as directed 50 each 4   rosuvastatin (CRESTOR) 20 MG tablet Take 1 tablet by mouth once daily 30 tablet 4   potassium chloride SA (KLOR-CON M) 20 MEQ tablet Take 1 tablet (20 mEq total) by mouth daily. 30 tablet 3   No facility-administered medications prior to visit.    Allergies  Allergen Reactions   Codeine Itching and Anaphylaxis  ROS Review of Systems  Constitutional: Negative.   HENT: Negative.    Eyes: Negative.   Respiratory:  Negative for chest tightness and shortness of breath.   Cardiovascular:  Positive for leg swelling. Negative for chest pain and palpitations.  Gastrointestinal: Negative.   Genitourinary: Negative.   Musculoskeletal: Negative.   Neurological: Negative.   Psychiatric/Behavioral:  Negative for agitation, behavioral problems and confusion.       Objective:    Physical Exam Constitutional:      Appearance: Normal appearance. She is obese.  HENT:     Head: Normocephalic.     Right Ear: Tympanic membrane normal.     Left Ear: Tympanic membrane normal.     Nose: Nose normal.     Mouth/Throat:     Mouth: Mucous membranes are moist.     Pharynx: Oropharynx is clear.  Eyes:     Extraocular Movements: Extraocular movements intact.     Conjunctiva/sclera: Conjunctivae normal.     Pupils: Pupils are equal, round, and reactive to light.  Cardiovascular:     Rate and Rhythm: Normal rate and regular rhythm.     Pulses: Normal pulses.     Heart sounds: Normal heart sounds.  Pulmonary:     Effort: Pulmonary effort is normal. No respiratory distress.     Breath sounds: Normal breath sounds. No rhonchi.  Abdominal:     General: Bowel sounds are normal.     Palpations: Abdomen is soft. There is no mass.     Tenderness: There is no abdominal tenderness.     Hernia: No hernia is present.  Musculoskeletal:     Cervical back: Neck supple. No tenderness.     Right lower leg: Edema  present.     Left lower leg: Edema present.  Skin:    General: Skin is warm.     Capillary Refill: Capillary refill takes less than 2 seconds.  Neurological:     General: No focal deficit present.     Mental Status: She is alert and oriented to person, place, and time. Mental status is at baseline.  Psychiatric:        Mood and Affect: Mood normal.        Behavior: Behavior normal.        Thought Content: Thought content normal.        Judgment: Judgment normal.     BP 118/78   Pulse 94   Temp 98.4 F (36.9 C)   Ht 5' 3"$  (1.6 m)   Wt (!) 322 lb 6.4 oz (146.2 kg)   SpO2 99%   BMI 57.11 kg/m  Wt Readings from Last 3 Encounters:  02/10/22 (!) 322 lb 6.4 oz (146.2 kg)  01/18/22 (!) 324 lb 12.8 oz (147.3 kg)  12/09/21 (!) 328 lb 8 oz (149 kg)     Health Maintenance  Topic Date Due   HIV Screening  Never done   Diabetic kidney evaluation - Urine ACR  Never done   Hepatitis C Screening  Never done   COVID-19 Vaccine (3 - Moderna risk series) 02/26/2022 (Originally 05/13/2019)   COLONOSCOPY (Pts 45-62yr Insurance coverage will need to be confirmed)  12/10/2022 (Originally 07/26/2015)   Zoster Vaccines- Shingrix (2 of 2) 04/07/2022   OPHTHALMOLOGY EXAM  05/25/2022   HEMOGLOBIN A1C  06/10/2022   FOOT EXAM  07/23/2022   Diabetic kidney evaluation - eGFR measurement  07/27/2022   MAMMOGRAM  03/25/2023   PAP SMEAR-Modifier  05/06/2023   DTaP/Tdap/Td (3 -  Td or Tdap) 02/11/2032   INFLUENZA VACCINE  Completed   HPV VACCINES  Aged Out    There are no preventive care reminders to display for this patient.  Lab Results  Component Value Date   TSH 4.22 07/26/2021   Lab Results  Component Value Date   WBC 5.9 07/26/2021   HGB 11.6 (L) 07/26/2021   HCT 37.2 07/26/2021   MCV 91.0 07/26/2021   PLT 208 07/26/2021   Lab Results  Component Value Date   NA 136 07/26/2021   K 4.2 07/26/2021   CO2 22 07/26/2021   GLUCOSE 290 (H) 07/26/2021   BUN 12 07/26/2021   CREATININE  0.63 07/26/2021   BILITOT 0.3 07/26/2021   ALKPHOS 109 05/05/2020   AST 14 07/26/2021   ALT 16 07/26/2021   PROT 6.4 07/26/2021   ALBUMIN 3.9 05/05/2020   CALCIUM 8.5 (L) 07/26/2021   ANIONGAP 5 06/23/2021   EGFR 107 07/26/2021   Lab Results  Component Value Date   CHOL 97 07/26/2021   Lab Results  Component Value Date   HDL 31 (L) 07/26/2021   Lab Results  Component Value Date   LDLCALC 27 07/26/2021   Lab Results  Component Value Date   TRIG 373 (H) 07/26/2021   Lab Results  Component Value Date   CHOLHDL 3.1 07/26/2021   Lab Results  Component Value Date   HGBA1C 7.3 (A) 12/09/2021      Assessment & Plan:   Problem List Items Addressed This Visit       Endocrine   Diabetes ([place]) - Primary    Previous A1c 7.3 on 12/09/2021. Continue Trulicity and glimepiride. Advised pt to check the BS regularly, make a log and bring to next appointment.  Advised pt to eat variety of food including fruits, vegetables, whole grains, complex carbohydrates and proteins.         Relevant Orders   Microalbumin / creatinine urine ratio   Basic metabolic panel   CBC with Differential/Platelet   Hemoglobin A1c   TSH     Other   Hyperlipidemia    Elevated triglyceride 373 and low HDL 31 on 07/26/2021. Watch diet. Continue rosuvastatin 20 mg for cardiovascular risk reduction.      Relevant Orders   Lipid panel   TSH   Edema    Lower extremity had edema bilaterally. Continue furosemide 20 mg daily.      Other Visit Diagnoses     Need for Tdap vaccination       Relevant Orders   Tdap vaccine greater than or equal to 7yo IM (Completed)        No orders of the defined types were placed in this encounter.    Follow-up: No follow-ups on file.    Theresia Lo, NP

## 2022-02-10 NOTE — Patient Instructions (Addendum)
Singles and Tdap vaccine administered today. Continue take the current medication. Labs ordered for future.

## 2022-02-13 ENCOUNTER — Inpatient Hospital Stay: Payer: Medicaid Other | Attending: Oncology | Admitting: Licensed Clinical Social Worker

## 2022-02-13 DIAGNOSIS — D0511 Intraductal carcinoma in situ of right breast: Secondary | ICD-10-CM

## 2022-02-13 NOTE — Progress Notes (Signed)
[place] CSW Counseling Note  Patient was referred by medical provider. Treatment type: Individual  Presenting Concerns: Patient and/or family reports the following symptoms/concerns: anxiety, depression, and adjustment to diagnosis and post-treatment Duration of problem: 8 months; Severity of problem: mild   Orientation:oriented to person, place, time/date, situation, day of week, month of year, and year.   Affect: Appropriate Risk of harm to self or others: No plan to harm self or others  Patient and/or Family's Strengths/Protective Factors: Social connections, Social and Emotional competence, Concrete supports in place (healthy food, safe environments, etc.), Sense of purpose, Physical Health (exercise, healthy diet, medication compliance, etc.), and Caregiver has knowledge of parenting & child developmentAbility for insight  Active sense of humor  Average or above average intelligence  Capable of independent living  Communication skills  General fund of knowledge  Motivation for treatment/growth  Physical Health  Special hobby/interest  Supportive family/friends  Work skills      Goals Addressed: Patient will:  Reduce symptoms of: anxiety, depression, and stress Increase knowledge and/or ability of: coping skills, healthy habits, and stress reduction  Increase healthy adjustment to current life circumstances and Begin healthy grieving over loss   Progress towards Goals: Progressing   Interventions: Interventions utilized:  CBT, Solution Focused, Strength-based, Supportive, Family Systems, and Reframing      Assessment: Patient currently experiencing anxiety, mild symptoms of depression and adjustment difficulties to diagnosis and post-op recuperation. Patient reports marked improvement in overall feeling and decisions making. Patient stated she is experiencing less stress over medical concerns, but is still dealing with financial concerns. Discussed continue improvement with  communication with support system, mindfulness skills and body acceptance.        Plan: Follow up with CSW: 03/16/2022 @ 3:00PM Behavioral recommendations: : continue using mindfulness skills, improve communication and acceptance skills   Referral(s): Support group(s):    N/A       Adelene Amas, LCSW   Patient is participating in a Managed Medicaid Plan:  Yes

## 2022-02-20 ENCOUNTER — Encounter: Payer: Self-pay | Admitting: Nurse Practitioner

## 2022-02-21 ENCOUNTER — Encounter: Payer: Self-pay | Admitting: Nurse Practitioner

## 2022-02-21 ENCOUNTER — Other Ambulatory Visit: Payer: Self-pay

## 2022-02-21 DIAGNOSIS — G4733 Obstructive sleep apnea (adult) (pediatric): Secondary | ICD-10-CM

## 2022-02-21 DIAGNOSIS — Z1211 Encounter for screening for malignant neoplasm of colon: Secondary | ICD-10-CM

## 2022-02-21 DIAGNOSIS — E119 Type 2 diabetes mellitus without complications: Secondary | ICD-10-CM

## 2022-02-21 MED ORDER — ACCU-CHEK SOFTCLIX LANCETS MISC: 4 refills | Status: DC

## 2022-02-21 MED ORDER — ROSUVASTATIN CALCIUM 20 MG PO TABS: 20.0000 mg | ORAL_TABLET | Freq: Every day | ORAL | 4 refills | Status: DC

## 2022-02-21 MED ORDER — GLIMEPIRIDE 4 MG PO TABS: 4.0000 mg | ORAL_TABLET | Freq: Every day | ORAL | 1 refills | Status: DC

## 2022-02-21 MED ORDER — ACCU-CHEK GUIDE VI STRP: ORAL_STRIP | 4 refills | Status: DC

## 2022-02-21 MED ORDER — FLUTICASONE PROPIONATE 50 MCG/ACT NA SUSP: NASAL | 0 refills | Status: DC

## 2022-02-21 NOTE — Assessment & Plan Note (Signed)
Previous A1c 7.3 on 12/09/2021. Continue Trulicity and glimepiride. Advised pt to check the BS regularly, make a log and bring to next appointment.  Advised pt to eat variety of food including fruits, vegetables, whole grains, complex carbohydrates and proteins.

## 2022-02-21 NOTE — Assessment & Plan Note (Signed)
Lower extremity had edema bilaterally. Continue furosemide 20 mg daily.

## 2022-02-21 NOTE — Assessment & Plan Note (Signed)
Elevated triglyceride 373 and low HDL 31 on 07/26/2021. Watch diet. Continue rosuvastatin 20 mg for cardiovascular risk reduction.

## 2022-02-22 ENCOUNTER — Other Ambulatory Visit: Payer: Self-pay

## 2022-02-22 DIAGNOSIS — Z9013 Acquired absence of bilateral breasts and nipples: Secondary | ICD-10-CM | POA: Diagnosis not present

## 2022-02-23 ENCOUNTER — Other Ambulatory Visit: Payer: Self-pay

## 2022-02-23 ENCOUNTER — Telehealth: Payer: Self-pay

## 2022-02-23 NOTE — Telephone Encounter (Signed)
LVM for pt to return my call to schedule her colonoscopy.  Thanks, [place], [place]

## 2022-02-24 ENCOUNTER — Telehealth: Payer: Self-pay

## 2022-02-24 ENCOUNTER — Other Ambulatory Visit: Payer: Self-pay

## 2022-02-24 DIAGNOSIS — Z1211 Encounter for screening for malignant neoplasm of colon: Secondary | ICD-10-CM

## 2022-02-24 MED ORDER — NA SULFATE-K SULFATE-MG SULF 17.5-3.13-1.6 GM/177ML PO SOLN: 1.0000 | Freq: Once | ORAL | 0 refills | Status: AC

## 2022-02-24 NOTE — Telephone Encounter (Signed)
Gastroenterology Pre-Procedure Review  Request Date: 03/08/22 Requesting Physician: Dr. Vicente Males  PATIENT REVIEW QUESTIONS: The patient responded to the following health history questions as indicated:    1. Are you having any GI issues? no 2. Do you have a personal history of Polyps? no 3. Do you have a family history of Colon Cancer or Polyps? Maternal Grandfather colon cancer 4. Diabetes Mellitus? Yes patient takes her Trulicity on Mondays.  Has been advised to stop on 02/27/22 may resume after colonoscopy.  Pt advised to stop Glimepiride 1 day prior to procedure on 03/07/22.  This has been noted on instructions. 5. Joint replacements in the past 12 months?no 6. Major health problems in the past 3 months?no 7. Any artificial heart valves, MVP, or defibrillator?no    MEDICATIONS & ALLERGIES:    Patient reports the following regarding taking any anticoagulation/antiplatelet therapy:   Plavix, Coumadin, Eliquis, Xarelto, Lovenox, Pradaxa, Brilinta, or Effient? no Aspirin? no  Patient confirms/reports the following medications:  Current Outpatient Medications  Medication Sig Dispense Refill   Accu-Chek Softclix Lancets lancets Use as instructed 100 each 4   albuterol (VENTOLIN HFA) 108 (90 Base) MCG/ACT inhaler Inhale 2 puffs into the lungs every 6 (six) hours as needed for wheezing or shortness of breath. 8 g 0   Cobalamin Combinations (B-12) [phone] MCG SUBL daily.     Dulaglutide (TRULICITY) A999333 0000000 SOPN Inject 0.75 mg into the skin once a week. 0.5 mL 4   fluticasone (FLONASE) 50 MCG/ACT nasal spray Use 2 spray(s) in each nostril once daily 16 g 0   furosemide (LASIX) 20 MG tablet Take 1 tablet by mouth once daily 30 tablet 4   gabapentin (NEURONTIN) 100 MG capsule TAKE 1 CAPSULE BY MOUTH TWICE DAILY FOR 7 DAYS THEN INCREASE TO 2 CAPS TWICE DAILY AND CONTINUE     glimepiride (AMARYL) 4 MG tablet Take 1 tablet (4 mg total) by mouth daily before breakfast. 90 tablet 1   glucose  blood (ACCU-CHEK GUIDE) test strip Use as directed 50 each 4   hydrocortisone 2.5 % cream apply to face once a day Tues, Thurs, Sat 30 g 11   ketoconazole (NIZORAL) 2 % cream apply to face once a day Mon, Wed, Fri 15 g 0   ketoconazole (NIZORAL) 2 % shampoo apply three times per week, massage into scalp and leave in for 10 minutes before rinsing out 120 mL 3   loratadine (CLARITIN) 10 MG tablet Take 10 mg by mouth daily.     mometasone (ELOCON) 0.1 % cream apply to legs 5 nights a week 45 g 6   rosuvastatin (CRESTOR) 20 MG tablet Take 1 tablet (20 mg total) by mouth daily. 30 tablet 4   No current facility-administered medications for this visit.    Patient confirms/reports the following allergies:  Allergies  Allergen Reactions   Codeine Itching and Anaphylaxis    No orders of the defined types were placed in this encounter.   AUTHORIZATION INFORMATION Primary Insurance: 1D#: Group #:  Secondary Insurance: 1D#: Group #:  SCHEDULE INFORMATION: Date: 03/08/22 Time: Location: [facility]

## 2022-02-27 NOTE — Telephone Encounter (Signed)
PA Ozempic

## 2022-03-02 ENCOUNTER — Other Ambulatory Visit (HOSPITAL_COMMUNITY): Payer: Self-pay

## 2022-03-02 ENCOUNTER — Telehealth: Payer: Self-pay

## 2022-03-02 NOTE — Telephone Encounter (Signed)
Pharmacy Patient Advocate Encounter   Received notification that prior authorization for Ozempic (0.25 or 0.5 MG/DOSE) 2MG/3ML pen-injectors is required/requested.  Per Test Claim: PA required   PA submitted on 03/02/22 to (ins) [employer] via CoverMyMeds Key [id number] Status is pending

## 2022-03-06 ENCOUNTER — Telehealth: Payer: Self-pay

## 2022-03-06 NOTE — Telephone Encounter (Signed)
Pharmacy Patient Advocate Encounter  Received notification from [facility] that the request for prior authorization for Ozempic has been cancelled due to patient has alternative pharmacy benefits.

## 2022-03-06 NOTE — Telephone Encounter (Signed)
Pt advised.

## 2022-03-06 NOTE — Telephone Encounter (Signed)
My chart message sent to pt.

## 2022-03-07 ENCOUNTER — Encounter: Payer: Self-pay | Admitting: Gastroenterology

## 2022-03-07 DIAGNOSIS — H5213 Myopia, bilateral: Secondary | ICD-10-CM | POA: Diagnosis not present

## 2022-03-08 ENCOUNTER — Encounter
Admission: RE | Disposition: A | Discharge status: Home / Self Care | Payer: Self-pay | Source: Ambulatory Visit | Attending: Gastroenterology

## 2022-03-08 ENCOUNTER — Encounter: Payer: Self-pay | Admitting: Gastroenterology

## 2022-03-08 ENCOUNTER — Ambulatory Visit: Payer: Medicaid Other | Admitting: Certified Registered"

## 2022-03-08 ENCOUNTER — Ambulatory Visit
Admission: RE | Admit: 2022-03-08 | Discharge: 2022-03-08 | Disposition: A | Discharge status: Home / Self Care | Payer: Medicaid Other | Source: Ambulatory Visit | Attending: Gastroenterology | Admitting: Gastroenterology

## 2022-03-08 DIAGNOSIS — E119 Type 2 diabetes mellitus without complications: Secondary | ICD-10-CM | POA: Insufficient documentation

## 2022-03-08 DIAGNOSIS — D127 Benign neoplasm of rectosigmoid junction: Secondary | ICD-10-CM | POA: Insufficient documentation

## 2022-03-08 DIAGNOSIS — D124 Benign neoplasm of descending colon: Secondary | ICD-10-CM | POA: Diagnosis not present

## 2022-03-08 DIAGNOSIS — Z87891 Personal history of nicotine dependence: Secondary | ICD-10-CM | POA: Insufficient documentation

## 2022-03-08 DIAGNOSIS — Z1211 Encounter for screening for malignant neoplasm of colon: Secondary | ICD-10-CM | POA: Diagnosis not present

## 2022-03-08 DIAGNOSIS — D122 Benign neoplasm of ascending colon: Secondary | ICD-10-CM | POA: Diagnosis not present

## 2022-03-08 DIAGNOSIS — K621 Rectal polyp: Secondary | ICD-10-CM | POA: Diagnosis not present

## 2022-03-08 DIAGNOSIS — Z6841 Body Mass Index (BMI) 40.0 and over, adult: Secondary | ICD-10-CM | POA: Insufficient documentation

## 2022-03-08 DIAGNOSIS — J45909 Unspecified asthma, uncomplicated: Secondary | ICD-10-CM | POA: Diagnosis not present

## 2022-03-08 DIAGNOSIS — D126 Benign neoplasm of colon, unspecified: Secondary | ICD-10-CM

## 2022-03-08 DIAGNOSIS — Z7984 Long term (current) use of oral hypoglycemic drugs: Secondary | ICD-10-CM | POA: Diagnosis not present

## 2022-03-08 HISTORY — PX: COLONOSCOPY WITH PROPOFOL: SHX5780

## 2022-03-08 LAB — POCT PREGNANCY, URINE: Preg Test, Ur: NEGATIVE

## 2022-03-08 SURGERY — COLONOSCOPY WITH PROPOFOL
Anesthesia: General

## 2022-03-08 MED ORDER — PROPOFOL 10 MG/ML IV BOLUS
INTRAVENOUS | Status: AC
  Filled 2022-03-08: qty 20

## 2022-03-08 MED ORDER — SODIUM CHLORIDE 0.9 % IV SOLN: INTRAVENOUS | Status: DC

## 2022-03-08 MED ORDER — LIDOCAINE HCL (CARDIAC) PF 100 MG/5ML IV SOSY
PREFILLED_SYRINGE | INTRAVENOUS | Status: DC | PRN
  Administered 2022-03-08: 100 mg via INTRAVENOUS

## 2022-03-08 MED ORDER — PROPOFOL 10 MG/ML IV BOLUS
INTRAVENOUS | Status: DC | PRN
  Administered 2022-03-08: 85 ug/kg/min via INTRAVENOUS
  Administered 2022-03-08: 120 mg via INTRAVENOUS

## 2022-03-08 NOTE — Anesthesia Preprocedure Evaluation (Addendum)
Anesthesia Evaluation  Patient identified by MRN, date of birth, ID band Patient awake    Reviewed: Allergy & Precautions, NPO status , Patient's Chart, lab work & pertinent test results  Airway Mallampati: III  TM Distance: >3 FB Neck ROM: full    Dental  (+) Chipped, Dental Advidsory Given   Pulmonary neg shortness of breath, asthma , former smoker   Pulmonary exam normal        Cardiovascular (-) hypertension(-) angina (-) Past MI negative cardio ROS Normal cardiovascular exam     Neuro/Psych negative neurological ROS  negative psych ROS   GI/Hepatic negative GI ROS, Neg liver ROS,,,  Endo/Other  diabetes  Morbid obesity  Renal/GU negative Renal ROS  negative genitourinary   Musculoskeletal   Abdominal   Peds  Hematology negative hematology ROS (+)   Anesthesia Other Findings Past Medical History: No date: DCIS (ductal carcinoma in situ) of breast No date: Diabetes mellitus without complication (HCC) No date: Family history of bladder cancer No date: Family history of uterine cancer No date: High cholesterol No date: Neuromuscular disorder ([place])     Comment:  peripheral neuropathy No date: PONV (postoperative nausea and vomiting) No date: Vaginal Pap smear, abnormal  Past Surgical History: 03/08/2021: BREAST BIOPSY; Right     Comment:  right breast bx coil clip -positive 03/24/2021: BREAST BIOPSY; Right     Comment:  stereo biopsy x clip/DUCTAL CARCINOMA IN SITU (DCIS), 05/18/2021: BREAST BIOPSY; Left     Comment:  ribbon clip 05/18/2021: BREAST BIOPSY; Right     Comment:  coil clip No date: CRYOTHERAPY 06/16/2021: EVACUATION BREAST HEMATOMA; Right     Comment:  Procedure: EVACUATION HEMATOMA BREAST;  Surgeon:               Ronny Bacon, MD;  Location: [facility];  Service:               General;  Laterality: Right; No date: MASTECTOMY 06/15/2021: SIMPLE MASTECTOMY WITH AXILLARY SENTINEL NODE  BIOPSY;  Right     Comment:  Procedure: SIMPLE MASTECTOMY WITH AXILLARY SENTINEL NODE              BIOPSY, Total;  Surgeon: Ronny Bacon, MD;  Location:              [facility];  Service: General;  Laterality: Right; 06/15/2021: TOTAL MASTECTOMY; Left     Comment:  Procedure: TOTAL MASTECTOMY, prophylactic left;                Surgeon: Ronny Bacon, MD;  Location: [facility];                Service: General;  Laterality: Left; No date: WRIST SURGERY  BMI    Body Mass Index: 56.83 kg/m      Reproductive/Obstetrics negative OB ROS                             Anesthesia Physical Anesthesia Plan  ASA: 3  Anesthesia Plan: General   Post-op Pain Management: Minimal or no pain anticipated   Induction: Intravenous  PONV Risk Score and Plan: 3 and Propofol infusion, TIVA and Ondansetron  Airway Management Planned: Nasal Cannula  Additional Equipment: None  Intra-op Plan:   Post-operative Plan:   Informed Consent: I have reviewed the patients History and Physical, chart, labs and discussed the procedure including the risks, benefits and alternatives for the proposed anesthesia with the patient or authorized representative  who has indicated his/her understanding and acceptance.     Dental advisory given  Plan Discussed with: CRNA and Surgeon  Anesthesia Plan Comments: (Discussed risks of anesthesia with patient, including possibility of difficulty with spontaneous ventilation under anesthesia necessitating airway intervention, PONV, and rare risks such as cardiac or respiratory or neurological events, and allergic reactions. Discussed the role of CRNA in patient's perioperative care. Patient understands.)       Anesthesia Quick Evaluation

## 2022-03-08 NOTE — Telephone Encounter (Signed)
Patient called insurance and fixed the issue. Resubmitted PA via CMM. Key: [id number]. Status pending

## 2022-03-08 NOTE — Telephone Encounter (Signed)
See other message

## 2022-03-08 NOTE — H&P (Signed)
Jonathon Bellows, MD [address], [place], [place], [place], [address] [address], [place], [place], [place], [address] Phone: [phone]  Fax: [phone]  Primary Care Physician:  Theresia Lo, NP   Pre-Procedure History & Physical: HPI:  MELANEY BETTELYOUN is a 52 y.o. female is here for an colonoscopy.   Past Medical History:  Diagnosis Date   DCIS (ductal carcinoma in situ) of breast    Diabetes mellitus without complication ([place])    Family history of bladder cancer    Family history of uterine cancer    High cholesterol    Neuromuscular disorder ([place])    peripheral neuropathy   PONV (postoperative nausea and vomiting)    Vaginal Pap smear, abnormal     Past Surgical History:  Procedure Laterality Date   BREAST BIOPSY Right 03/08/2021   right breast bx coil clip -positive   BREAST BIOPSY Right 03/24/2021   stereo biopsy x clip/DUCTAL CARCINOMA IN SITU (DCIS),   BREAST BIOPSY Left 05/18/2021   ribbon clip   BREAST BIOPSY Right 05/18/2021   coil clip   CRYOTHERAPY     EVACUATION BREAST HEMATOMA Right 06/16/2021   Procedure: EVACUATION HEMATOMA BREAST;  Surgeon: Ronny Bacon, MD;  Location: [facility];  Service: General;  Laterality: Right;   MASTECTOMY     SIMPLE MASTECTOMY WITH AXILLARY SENTINEL NODE BIOPSY Right 06/15/2021   Procedure: SIMPLE MASTECTOMY WITH AXILLARY SENTINEL NODE BIOPSY, Total;  Surgeon: Ronny Bacon, MD;  Location: [facility];  Service: General;  Laterality: Right;   TOTAL MASTECTOMY Left 06/15/2021   Procedure: TOTAL MASTECTOMY, prophylactic left;  Surgeon: Ronny Bacon, MD;  Location: [facility];  Service: General;  Laterality: Left;   WRIST SURGERY      Prior to Admission medications   Medication Sig Start Date End Date Taking? Authorizing Provider  Accu-Chek Softclix Lancets lancets Use as instructed 02/21/22   Theresia Lo, NP  albuterol (VENTOLIN HFA) 108 (90 Base) MCG/ACT inhaler Inhale 2 puffs into the lungs every  6 (six) hours as needed for wheezing or shortness of breath. 07/07/20   Brunetta Jeans, PA-C  Cobalamin Combinations (B-12) [phone] MCG SUBL daily.    [provider]  Dulaglutide (TRULICITY) [id number] SOPN Inject 0.75 mg into the skin once a week. 11/03/21   Theresia Lo, NP  fluticasone Asencion Islam) 50 MCG/ACT nasal spray Use 2 spray(s) in each nostril once daily 02/21/22   Theresia Lo, NP  furosemide (LASIX) 20 MG tablet Take 1 tablet by mouth once daily 10/10/21   Cletis Athens, MD  gabapentin (NEURONTIN) 100 MG capsule TAKE 1 CAPSULE BY MOUTH TWICE DAILY FOR 7 DAYS THEN INCREASE TO 2 CAPS TWICE DAILY AND CONTINUE 08/04/20   [provider]  glimepiride (AMARYL) 4 MG tablet Take 1 tablet (4 mg total) by mouth daily before breakfast. 02/21/22   Theresia Lo, NP  glucose blood (ACCU-CHEK GUIDE) test strip Use as directed 02/21/22   Theresia Lo, NP  hydrocortisone 2.5 % cream apply to face once a day [place], Sat 02/01/22   Ralene Bathe, MD  ketoconazole (NIZORAL) 2 % cream apply to face once a day Mon, Wed, Fri 02/01/22   Ralene Bathe, MD  ketoconazole (NIZORAL) 2 % shampoo apply three times per week, massage into scalp and leave in for 10 minutes before rinsing out 02/01/22   Ralene Bathe, MD  loratadine (CLARITIN) 10 MG tablet Take 10 mg by mouth daily.    [provider]  mometasone (ELOCON) 0.1 % cream apply to legs 5 nights a week 02/01/22   Ralene Bathe, MD  rosuvastatin (CRESTOR) 20 MG tablet Take 1 tablet (20 mg total) by mouth daily. 02/21/22   Theresia Lo, NP    Allergies as of 02/24/2022 - Review Complete 02/21/2022  Allergen Reaction Noted   Codeine Itching and Anaphylaxis 05/12/2015    Family History  Problem Relation Age of Onset   Uterine cancer Mother 80   Bladder Cancer Father 4   Skin cancer Maternal Aunt    Cancer - Other Maternal Grandfather        possible colon cancer   Breast cancer Maternal  Great-grandmother     Social History   Socioeconomic History   Marital status: Single    Spouse name: Not on file   Number of children: Not on file   Years of education: Not on file   Highest education level: Not on file  Occupational History   Not on file  Tobacco Use   Smoking status: Former    Packs/day: 1.00    Types: Cigarettes    Quit date: 05/30/2021    Years since quitting: 0.7   Smokeless tobacco: Never  Vaping Use   Vaping Use: Every day  Substance and Sexual Activity   Alcohol use: Yes    Comment: social   Drug use: Yes    Types: Marijuana   Sexual activity: Not Currently    Birth control/protection: None  Other Topics Concern   Not on file  Social History Narrative   Lives in [place]; self; no children; 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 former Smoke ; rare alcohol.    Social Determinants of Health   Financial Resource Strain: Medium Risk (01/05/2022)   Overall Financial Resource Strain (CARDIA)    Difficulty of Paying Living Expenses: Somewhat hard  Food Insecurity: No Food Insecurity (01/05/2022)   Hunger Vital Sign    Worried About Running Out of Food in the Last Year: Never true    Ran Out of Food in the Last Year: Never true  Transportation Needs: No Transportation Needs (01/05/2022)   PRAPARE - Hydrologist (Medical): No    Lack of Transportation (Non-Medical): No  Physical Activity: Inactive (01/05/2022)   Exercise Vital Sign    Days of Exercise per Week: 0 days    Minutes of Exercise per Session: 0 min  Stress: Stress Concern Present (01/05/2022)   [place]    Feeling of Stress : To some extent  Social Connections: Socially Isolated (01/05/2022)   Social Connection and Isolation Panel [NHANES]    Frequency of Communication with Friends and Family: More than three times a week    Frequency of Social Gatherings with Friends and Family: More than three times a week     Attends Religious Services: Never    Marine scientist or Organizations: No    Attends Archivist Meetings: Never    Marital Status: Never married  Intimate Partner Violence: Not At Risk (01/05/2022)   Humiliation, Afraid, Rape, and Kick questionnaire    Fear of Current or Ex-Partner: No    Emotionally Abused: No    Physically Abused: No    Sexually Abused: No    Review of Systems: See HPI, otherwise negative ROS  Physical Exam: There were no vitals taken for this visit. General:   Alert,  pleasant and cooperative in NAD Head:  Normocephalic and  atraumatic. Neck:  Supple; no masses or thyromegaly. Lungs:  Clear throughout to auscultation, normal respiratory effort.    Heart:  +S1, +S2, Regular rate and rhythm, No edema. Abdomen:  Soft, nontender and nondistended. Normal bowel sounds, without guarding, and without rebound.   Neurologic:  Alert and  oriented x4;  grossly normal neurologically.  Impression/Plan: TRINADY PARRILLO is here for an colonoscopy to be performed for Screening colonoscopy average risk   Risks, benefits, limitations, and alternatives regarding  colonoscopy have been reviewed with the patient.  Questions have been answered.  All parties agreeable.   Jonathon Bellows, MD  03/08/2022, 12:23 PM

## 2022-03-08 NOTE — Anesthesia Postprocedure Evaluation (Signed)
Anesthesia Post Note  Patient: Heather Cain  Procedure(s) Performed: COLONOSCOPY WITH PROPOFOL  Patient location during evaluation: Endoscopy Anesthesia Type: General Level of consciousness: awake and alert Pain management: pain level controlled Vital Signs Assessment: post-procedure vital signs reviewed and stable Respiratory status: spontaneous breathing, nonlabored ventilation, respiratory function stable and patient connected to nasal cannula oxygen Cardiovascular status: blood pressure returned to baseline and stable Postop Assessment: no apparent nausea or vomiting Anesthetic complications: no  No notable events documented.   Last Vitals:  Vitals:   03/08/22 1313 03/08/22 1324  BP: 105/67 116/81  Pulse:  95  Resp: 15 19  Temp: (!) 35.7 C   SpO2: 99% 96%    Last Pain:  Vitals:   03/08/22 1324  TempSrc:   PainSc: 0-No pain                 Dimas Millin

## 2022-03-08 NOTE — Op Note (Signed)
[facility] Patient Name: Heather Cain Procedure Date: 03/08/2022 12:25 PM MRN: [id number] Account #: [id number] Date of Birth: 10/20/1970 Admit Type: Outpatient Age: 52 Room: [facility] ENDO ROOM 3 Gender: Female Note Status: Finalized Instrument Name: Park Meo [id number] Procedure:             Colonoscopy Indications:           Screening for colorectal malignant neoplasm Providers:             Jonathon Bellows MD, MD Referring MD:          Theresia Lo (Referring MD) Medicines:             Monitored Anesthesia Care Complications:         No immediate complications. Procedure:             Pre-Anesthesia Assessment:                        - Prior to the procedure, a History and Physical was                         performed, and patient medications, allergies and                         sensitivities were reviewed. The patient's tolerance                         of previous anesthesia was reviewed.                        - The risks and benefits of the procedure and the                         sedation options and risks were discussed with the                         patient. All questions were answered and informed                         consent was obtained.                        - ASA Grade Assessment: II - A patient with mild                         systemic disease.                        After obtaining informed consent, the colonoscope was                         passed under direct vision. Throughout the procedure,                         the patient's blood pressure, pulse, and oxygen                         saturations were monitored continuously. The                         Colonoscope was introduced through  the anus and                         advanced to the the cecum, identified by the                         appendiceal orifice. The colonoscopy was performed                         with ease. The patient tolerated the procedure well.                          The quality of the bowel preparation was good. The                         ileocecal valve, appendiceal orifice, and rectum were                         photographed. Findings:      The perianal and digital rectal examinations were normal.      Three sessile polyps were found in the ascending colon. The polyps were       5 to 7 mm in size. These polyps were removed with a cold snare.       Resection and retrieval were complete.      Two sessile polyps were found in the descending colon. The polyps were 5       to 7 mm in size. These polyps were removed with a cold snare. Resection       and retrieval were complete.      Three sessile polyps were found in the recto-sigmoid colon. The polyps       were 5 to 7 mm in size. These polyps were removed with a cold snare.       Resection and retrieval were complete.      An 8 mm polyp was found in the rectum. The polyp was sessile. The polyp       was removed with a hot snare. Resection and retrieval were complete.      The exam was otherwise without abnormality on direct and retroflexion       views. Impression:            - Three 5 to 7 mm polyps in the ascending colon,                         removed with a cold snare. Resected and retrieved.                        - Two 5 to 7 mm polyps in the descending colon,                         removed with a cold snare. Resected and retrieved.                        - Three 5 to 7 mm polyps at the recto-sigmoid colon,                         removed with a cold snare. Resected and retrieved.                        -  One 8 mm polyp in the rectum, removed with a hot                         snare. Resected and retrieved.                        - The examination was otherwise normal on direct and                         retroflexion views. Recommendation:        - Discharge patient to home (with escort).                        - Resume previous diet.                        - Continue  present medications.                        - Await pathology results.                        - Repeat colonoscopy for surveillance based on                         pathology results. Procedure Code(s):     --- Professional ---                        [phone], Colonoscopy, flexible; with removal of                         tumor(s), polyp(s), or other lesion(s) by snare                         technique Diagnosis Code(s):     --- Professional ---                        Z12.11, Encounter for screening for malignant neoplasm                         of colon                        D12.2, Benign neoplasm of ascending colon                        D12.4, Benign neoplasm of descending colon                        D12.7, Benign neoplasm of rectosigmoid junction                        D12.8, Benign neoplasm of rectum CPT copyright 2022 American Medical Association. All rights reserved. The codes documented in this report are preliminary and upon coder review may  be revised to meet current compliance requirements. Jonathon Bellows, MD Jonathon Bellows MD, MD 03/08/2022 1:13:00 PM This report has been signed electronically. Number of Addenda: 0 Note Initiated On: 03/08/2022 12:25 PM Scope Withdrawal Time: 0 hours 16 minutes 22 seconds  Total Procedure Duration: 0 hours 18 minutes 49 seconds  Estimated Blood Loss:  Estimated blood loss: none.      [facility]

## 2022-03-08 NOTE — Transfer of Care (Signed)
Immediate Anesthesia Transfer of Care Note  Patient: Heather Cain  Procedure(s) Performed: COLONOSCOPY WITH PROPOFOL  Patient Location: PACU  Anesthesia Type:General  Level of Consciousness: awake, alert , and oriented  Airway & Oxygen Therapy: Patient Spontanous Breathing  Post-op Assessment: Report given to RN and Post -op Vital signs reviewed and stable  Post vital signs: Reviewed and stable  Last Vitals:  Vitals Value Taken Time  BP 105/67 03/08/22 1315  Temp 35.7 C 03/08/22 1313  Pulse 99 03/08/22 1316  Resp 18 03/08/22 1316  SpO2 96 % 03/08/22 1316  Vitals shown include unvalidated device data.  Last Pain:  Vitals:   03/08/22 1313  TempSrc: Temporal  PainSc: 0-No pain         Complications: No notable events documented.

## 2022-03-09 ENCOUNTER — Encounter: Payer: Self-pay | Admitting: Gastroenterology

## 2022-03-09 ENCOUNTER — Other Ambulatory Visit (HOSPITAL_COMMUNITY): Payer: Self-pay

## 2022-03-09 LAB — SURGICAL PATHOLOGY

## 2022-03-09 NOTE — Telephone Encounter (Signed)
Patient Advocate Encounter  Prior Authorization for Ozempic (0.25 or 0.5 MG/DOSE) '2MG'$ /3ML pen-injectors has been approved.    Effective dates: 03/07/22 through 03/08/23

## 2022-03-10 ENCOUNTER — Encounter: Payer: Self-pay | Admitting: Gastroenterology

## 2022-03-13 ENCOUNTER — Telehealth: Payer: Self-pay

## 2022-03-13 ENCOUNTER — Other Ambulatory Visit: Payer: Self-pay

## 2022-03-13 MED ORDER — SEMAGLUTIDE(0.25 OR 0.5MG/DOS) 2 MG/3ML ~~LOC~~ SOPN: 0.2500 mg | PEN_INJECTOR | SUBCUTANEOUS | 0 refills | Status: DC

## 2022-03-13 NOTE — Telephone Encounter (Signed)
S/w pt - authorization placed for 74m pen for 56 days ?  Will look into auth

## 2022-03-13 NOTE — Telephone Encounter (Signed)
PA Ozempic ?

## 2022-03-13 NOTE — Telephone Encounter (Addendum)
Patient called back and needed to speak to nurse about Ozempic and asked for a call back at [phone]. She said she really needs to talk to someone today

## 2022-03-14 DIAGNOSIS — R202 Paresthesia of skin: Secondary | ICD-10-CM | POA: Diagnosis not present

## 2022-03-14 DIAGNOSIS — G5602 Carpal tunnel syndrome, left upper limb: Secondary | ICD-10-CM | POA: Diagnosis not present

## 2022-03-14 DIAGNOSIS — G629 Polyneuropathy, unspecified: Secondary | ICD-10-CM | POA: Diagnosis not present

## 2022-03-14 DIAGNOSIS — M65321 Trigger finger, right index finger: Secondary | ICD-10-CM | POA: Diagnosis not present

## 2022-03-14 DIAGNOSIS — R200 Anesthesia of skin: Secondary | ICD-10-CM | POA: Diagnosis not present

## 2022-03-14 NOTE — Telephone Encounter (Signed)
Authorization re-submitted

## 2022-03-16 ENCOUNTER — Inpatient Hospital Stay: Payer: Medicaid Other | Attending: Oncology | Admitting: Licensed Clinical Social Worker

## 2022-03-16 NOTE — Progress Notes (Signed)
[place] CSW Counseling Note  Patient was referred by medical provider. Treatment type: Individual  Presenting Concerns: Patient and/or family reports the following symptoms/concerns: anxiety, depression, stress, and adjustment concerns post-op Duration of problem: 9 months; Severity of problem: mild   Orientation:oriented to person, place, time/date, situation, day of week, month of year, and year.   Affect: Appropriate Risk of harm to self or others: No plan to harm self or others  Patient and/or Family's Strengths/Protective Factors: Social connections, Social and Emotional competence, Concrete supports in place (healthy food, safe environments, etc.), Sense of purpose, Physical Health (exercise, healthy diet, medication compliance, etc.), and Caregiver has knowledge of parenting & child developmentAbility for insight  Active sense of humor  Average or above average intelligence  Capable of independent living  Engineer, drilling fund of knowledge  Motivation for treatment/growth  Physical Health  Special hobby/interest  Supportive family/friends  Work skills      Goals Addressed: Patient will:  Reduce symptoms of: anxiety, depression, and stress Increase knowledge and/or ability of: coping skills, healthy habits, self-management skills, and stress reduction  Increase healthy adjustment to current life circumstances, Increase motivation to adhere to plan of care, Improve medication compliance, and Begin healthy grieving over loss   Progress towards Goals: Progressing   Interventions: Interventions utilized:  DBT, Solution Focused, Strength-based, Supportive, Family Systems, and Reframing      Assessment: Patient currently experiencing  anxiety, mild symptoms of depression and adjustment difficulties to diagnosis and post-op recuperation. Patient continues to report improvement in overall feeling and decisions making. Patient stated she is  experiencing less stress over medical concerns, but is still dealing with financial concerns. Discussed continue improvement with communication with support system, mindfulness skills and body acceptance.  Discussed trusting and accepting support from support persons. Discussed effective communication with support persons.      Plan: Follow up with CSW: 04/13/2022 @ 3:00PM Behavioral recommendations: continue using mindfulness skills, improve communication and acceptance skills    Referral(s): Support group(s):    N/A       Adelene Amas, LCSW   Patient is participating in a Managed Medicaid Plan:  Yes

## 2022-03-17 ENCOUNTER — Other Ambulatory Visit (HOSPITAL_COMMUNITY): Payer: Self-pay

## 2022-03-17 NOTE — Telephone Encounter (Signed)
noted 

## 2022-03-21 DIAGNOSIS — Z9013 Acquired absence of bilateral breasts and nipples: Secondary | ICD-10-CM | POA: Diagnosis not present

## 2022-03-21 DIAGNOSIS — Z713 Dietary counseling and surveillance: Secondary | ICD-10-CM | POA: Diagnosis not present

## 2022-04-05 ENCOUNTER — Encounter: Payer: Self-pay | Admitting: Nurse Practitioner

## 2022-04-05 ENCOUNTER — Other Ambulatory Visit: Payer: Self-pay | Admitting: Nurse Practitioner

## 2022-04-05 ENCOUNTER — Other Ambulatory Visit: Payer: Self-pay

## 2022-04-05 DIAGNOSIS — E119 Type 2 diabetes mellitus without complications: Secondary | ICD-10-CM

## 2022-04-05 DIAGNOSIS — R609 Edema, unspecified: Secondary | ICD-10-CM

## 2022-04-05 DIAGNOSIS — G4733 Obstructive sleep apnea (adult) (pediatric): Secondary | ICD-10-CM

## 2022-04-05 MED ORDER — FLUTICASONE PROPIONATE 50 MCG/ACT NA SUSP: NASAL | 0 refills | Status: DC

## 2022-04-05 MED ORDER — FUROSEMIDE 20 MG PO TABS: 20.0000 mg | ORAL_TABLET | Freq: Every day | ORAL | 4 refills | Status: DC

## 2022-04-05 MED ORDER — GLIMEPIRIDE 4 MG PO TABS: 4.0000 mg | ORAL_TABLET | Freq: Every day | ORAL | 1 refills | Status: DC

## 2022-04-05 NOTE — Telephone Encounter (Signed)
Referral note says need order for [facility] for sleep study, I havre pended medication request for your approval.

## 2022-04-05 NOTE — Telephone Encounter (Signed)
It is done. Thank you so much for your help.

## 2022-04-11 ENCOUNTER — Encounter: Payer: Self-pay | Admitting: Nurse Practitioner

## 2022-04-11 DIAGNOSIS — H524 Presbyopia: Secondary | ICD-10-CM | POA: Diagnosis not present

## 2022-04-13 ENCOUNTER — Inpatient Hospital Stay: Payer: Medicaid Other | Attending: Oncology | Admitting: Licensed Clinical Social Worker

## 2022-04-13 DIAGNOSIS — D0511 Intraductal carcinoma in situ of right breast: Secondary | ICD-10-CM

## 2022-04-13 NOTE — Progress Notes (Signed)
[place] CSW Counseling Note  Patient was referred by medical provider. Treatment type: Individual  Presenting Concerns: Patient and/or family reports the following symptoms/concerns: anxiety, depression, stress, and adjustment concerns post-op Duration of problem: 10 months; Severity of problem: mild   Orientation:oriented to person, place, time/date, situation, day of week, month of year, and year.   Affect: Appropriate Risk of harm to self or others: No plan to harm self or others  Patient and/or Family's Strengths/Protective Factors: Social connections, Social and Emotional competence, Concrete supports in place (healthy food, safe environments, etc.), Sense of purpose, and Physical Health (exercise, healthy diet, medication compliance, etc.)Ability for insight  Active sense of humor  Average or above average intelligence  Capable of independent living  Communication skills  General fund of knowledge  Motivation for treatment/growth  Physical Health  Special hobby/interest  Supportive family/friends  Work skills      Goals Addressed: Patient will:  Reduce symptoms of: anxiety, depression, stress, and adjustment concerns Increase knowledge and/or ability of: coping skills, healthy habits, self-management skills, and stress reduction  Increase healthy adjustment to current life circumstances, Increase adequate support systems for patient/family, Increase motivation to adhere to plan of care, and Begin healthy grieving over loss   Progress towards Goals: Progressing   Interventions: Interventions utilized:  CBT, Strength-based, Supportive, Family Systems, and Reframing      Assessment: Patient currently experiencing  anxiety, mild symptoms of depression and adjustment difficulties to diagnosis and post-op recuperation. Patient continues to report improvement in overall feeling and decisions making. Patient stated she is experiencing less stress over medical concerns, but is still  dealing with financial concerns. Patient reports considering different types of weight loss programs. Discussed continue improvement with communication with support system, mindfulness skills and body acceptance.  Discussed trusting and accepting support from support persons. Discussed effective communication with support persons.  .      Plan: Follow up with CSW: 05/09/2022 @ 3:00PM Behavioral recommendations:  continue using mindfulness skills, improve communication and acceptance skills    Referral(s): Support group(s):  N/A         Adelene Amas, LCSW   Patient is participating in a Managed Medicaid Plan:  Yes

## 2022-04-17 ENCOUNTER — Encounter: Payer: Self-pay | Admitting: Nurse Practitioner

## 2022-04-17 MED ORDER — SEMAGLUTIDE(0.25 OR 0.5MG/DOS) 2 MG/3ML ~~LOC~~ SOPN: 0.5000 mg | PEN_INJECTOR | SUBCUTANEOUS | 2 refills | Status: DC

## 2022-04-20 DIAGNOSIS — C50919 Malignant neoplasm of unspecified site of unspecified female breast: Secondary | ICD-10-CM | POA: Diagnosis not present

## 2022-04-20 DIAGNOSIS — Z6841 Body Mass Index (BMI) 40.0 and over, adult: Secondary | ICD-10-CM | POA: Diagnosis not present

## 2022-04-20 DIAGNOSIS — E785 Hyperlipidemia, unspecified: Secondary | ICD-10-CM | POA: Diagnosis not present

## 2022-04-20 DIAGNOSIS — E669 Obesity, unspecified: Secondary | ICD-10-CM | POA: Diagnosis not present

## 2022-04-20 DIAGNOSIS — Z9013 Acquired absence of bilateral breasts and nipples: Secondary | ICD-10-CM | POA: Diagnosis not present

## 2022-04-20 DIAGNOSIS — E119 Type 2 diabetes mellitus without complications: Secondary | ICD-10-CM | POA: Diagnosis not present

## 2022-04-20 DIAGNOSIS — Z4431 Encounter for fitting and adjustment of external right breast prosthesis: Secondary | ICD-10-CM | POA: Diagnosis not present

## 2022-04-20 DIAGNOSIS — Z4432 Encounter for fitting and adjustment of external left breast prosthesis: Secondary | ICD-10-CM | POA: Diagnosis not present

## 2022-04-27 ENCOUNTER — Ambulatory Visit: Payer: Medicaid Other | Admitting: Nurse Practitioner

## 2022-04-27 ENCOUNTER — Encounter: Payer: Self-pay | Admitting: Nurse Practitioner

## 2022-04-27 VITALS — BP 134/80 | HR 80 | Temp 97.4°F | Ht 63.0 in | Wt 317.2 lb

## 2022-04-27 DIAGNOSIS — G4733 Obstructive sleep apnea (adult) (pediatric): Secondary | ICD-10-CM

## 2022-04-27 NOTE — Progress Notes (Signed)
Established Patient Office Visit  Subjective:  Patient ID: Heather Cain, female    DOB: 09/01/1970  Age: 52 y.o. MRN: [id number]  CC:  Chief Complaint  Patient presents with   Obstructive Sleep Apnea    Review of sleep study result    HPI  Heather Cain presents for review of sleep study result.   HPI   Past Medical History:  Diagnosis Date   DCIS (ductal carcinoma in situ) of breast    Diabetes mellitus without complication (HCC)    Family history of bladder cancer    Family history of uterine cancer    High cholesterol    Neuromuscular disorder (HCC)    peripheral neuropathy   PONV (postoperative nausea and vomiting)    Vaginal Pap smear, abnormal     Past Surgical History:  Procedure Laterality Date   BREAST BIOPSY Right 03/08/2021   right breast bx coil clip -positive   BREAST BIOPSY Right 03/24/2021   stereo biopsy x clip/DUCTAL CARCINOMA IN SITU (DCIS),   BREAST BIOPSY Left 05/18/2021   ribbon clip   BREAST BIOPSY Right 05/18/2021   coil clip   COLONOSCOPY WITH PROPOFOL N/A 03/08/2022   Procedure: COLONOSCOPY WITH PROPOFOL;  Surgeon: Wyline Mood, MD;  Location: [facility] ENDOSCOPY;  Service: Gastroenterology;  Laterality: N/A;  REQUESTED LAST CASE   CRYOTHERAPY     EVACUATION BREAST HEMATOMA Right 06/16/2021   Procedure: EVACUATION HEMATOMA BREAST;  Surgeon: Campbell Lerner, MD;  Location: [facility] ORS;  Service: General;  Laterality: Right;   MASTECTOMY     SIMPLE MASTECTOMY WITH AXILLARY SENTINEL NODE BIOPSY Right 06/15/2021   Procedure: SIMPLE MASTECTOMY WITH AXILLARY SENTINEL NODE BIOPSY, Total;  Surgeon: Campbell Lerner, MD;  Location: [facility] ORS;  Service: General;  Laterality: Right;   TOTAL MASTECTOMY Left 06/15/2021   Procedure: TOTAL MASTECTOMY, prophylactic left;  Surgeon: Campbell Lerner, MD;  Location: [facility] ORS;  Service: General;  Laterality: Left;   WRIST SURGERY      Family History  Problem Relation Age of Onset   Uterine cancer Mother 53    Bladder Cancer Father 67   Skin cancer Maternal Aunt    Cancer - Other Maternal Grandfather        possible colon cancer   Breast cancer Maternal Great-grandmother     Social History   Socioeconomic History   Marital status: Single    Spouse name: Not on file   Number of children: Not on file   Years of education: Not on file   Highest education level: Some college, no degree  Occupational History   Not on file  Tobacco Use   Smoking status: Former    Packs/day: 1    Types: Cigarettes    Quit date: 05/30/2021    Years since quitting: 0.9   Smokeless tobacco: Never  Vaping Use   Vaping Use: Every day  Substance and Sexual Activity   Alcohol use: Yes    Comment: social   Drug use: Yes    Types: Marijuana   Sexual activity: Not Currently    Birth control/protection: None  Other Topics Concern   Not on file  Social History Narrative   Lives in [place]; self; no children; 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 former Smoke ; rare alcohol.    Social Determinants of Health   Financial Resource Strain: High Risk (04/23/2022)   Overall Financial Resource Strain (CARDIA)    Difficulty of Paying Living Expenses: Hard  Food Insecurity: Food Insecurity Present (04/23/2022)   Hunger Vital  Sign    Worried About Programme researcher, broadcasting/film/video in the Last Year: Sometimes true    Ran Out of Food in the Last Year: Sometimes true  Transportation Needs: No Transportation Needs (04/23/2022)   PRAPARE - Administrator, Civil Service (Medical): No    Lack of Transportation (Non-Medical): No  Physical Activity: Insufficiently Active (04/23/2022)   Exercise Vital Sign    Days of Exercise per Week: 3 days    Minutes of Exercise per Session: 30 min  Stress: No Stress Concern Present (04/23/2022)   [employer] of Occupational Health - Occupational Stress Questionnaire    Feeling of Stress : Only a little  Recent Concern: Stress - Stress Concern Present (03/16/2022)   [employer] of Occupational Health  - Occupational Stress Questionnaire    Feeling of Stress : To some extent  Social Connections: Socially Isolated (04/23/2022)   Social Connection and Isolation Panel [NHANES]    Frequency of Communication with Friends and Family: More than three times a week    Frequency of Social Gatherings with Friends and Family: Once a week    Attends Religious Services: Never    Database administrator or Organizations: No    Attends Banker Meetings: Never    Marital Status: Never married  Intimate Partner Violence: Not At Risk (03/16/2022)   Humiliation, Afraid, Rape, and Kick questionnaire    Fear of Current or Ex-Partner: No    Emotionally Abused: No    Physically Abused: No    Sexually Abused: No     Outpatient Medications Prior to Visit  Medication Sig Dispense Refill   Accu-Chek Softclix Lancets lancets Use as instructed 100 each 4   albuterol (VENTOLIN HFA) 108 (90 Base) MCG/ACT inhaler Inhale 2 puffs into the lungs every 6 (six) hours as needed for wheezing or shortness of breath. 8 g 0   fluticasone (FLONASE) 50 MCG/ACT nasal spray Use 2 spray(s) in each nostril once daily 16 g 0   furosemide (LASIX) 20 MG tablet Take 1 tablet (20 mg total) by mouth daily. 30 tablet 4   gabapentin (NEURONTIN) 100 MG capsule TAKE 1 CAPSULE BY MOUTH TWICE DAILY FOR 7 DAYS THEN INCREASE TO 2 CAPS TWICE DAILY AND CONTINUE     glimepiride (AMARYL) 4 MG tablet Take 1 tablet (4 mg total) by mouth daily before breakfast. 90 tablet 1   glucose blood (ACCU-CHEK GUIDE) test strip Use as directed 50 each 4   hydrocortisone 2.5 % cream apply to face once a day Tues, Thurs, Sat 30 g 11   ketoconazole (NIZORAL) 2 % shampoo apply three times per week, massage into scalp and leave in for 10 minutes before rinsing out 120 mL 3   loratadine (CLARITIN) 10 MG tablet Take 10 mg by mouth daily.     mometasone (ELOCON) 0.1 % cream apply to legs 5 nights a week 45 g 6   rosuvastatin (CRESTOR) 20 MG tablet Take 1 tablet  (20 mg total) by mouth daily. 30 tablet 4   Semaglutide,0.25 or 0.5MG /DOS, 2 MG/3ML SOPN Inject 0.5 mg into the skin once a week. 3 mL 2   Cobalamin Combinations (B-12) [phone] MCG SUBL daily.     ketoconazole (NIZORAL) 2 % cream apply to face once a day Mon, Wed, Fri 15 g 0   No facility-administered medications prior to visit.    Allergies  Allergen Reactions   Codeine Itching and Anaphylaxis    ROS Review of Systems  Constitutional: Negative.   HENT:         Ear sensitive  Respiratory: Negative.        Objective:    Physical Exam  BP 134/80   Pulse 80   Temp (!) 97.4 F (36.3 C)   Ht 5\' 3"  (1.6 m)   Wt (!) 317 lb 3.2 oz (143.9 kg)   SpO2 96%   BMI 56.19 kg/m  Wt Readings from Last 3 Encounters:  04/27/22 (!) 317 lb 3.2 oz (143.9 kg)  03/08/22 (!) 320 lb 12.8 oz (145.5 kg)  02/10/22 (!) 322 lb 6.4 oz (146.2 kg)     Health Maintenance  Topic Date Due   HIV Screening  Never done   Diabetic kidney evaluation - Urine ACR  Never done   Hepatitis C Screening  Never done   Zoster Vaccines- Shingrix (2 of 2) 04/07/2022   COVID-19 Vaccine (3 - Moderna risk series) 05/13/2022 (Originally 05/13/2019)   OPHTHALMOLOGY EXAM  05/25/2022   HEMOGLOBIN A1C  06/10/2022   FOOT EXAM  07/23/2022   Diabetic kidney evaluation - eGFR measurement  07/27/2022   INFLUENZA VACCINE  08/10/2022   MAMMOGRAM  04/28/2023   PAP SMEAR-Modifier  05/06/2023   COLONOSCOPY (Pts 45-74yrs Insurance coverage will need to be confirmed)  03/08/2025   DTaP/Tdap/Td (3 - Td or Tdap) 02/11/2032   HPV VACCINES  Aged Out    There are no preventive care reminders to display for this patient.  Lab Results  Component Value Date   TSH 4.22 07/26/2021   Lab Results  Component Value Date   WBC 5.9 07/26/2021   HGB 11.6 (L) 07/26/2021   HCT 37.2 07/26/2021   MCV 91.0 07/26/2021   PLT 208 07/26/2021   Lab Results  Component Value Date   NA 136 07/26/2021   K 4.2 07/26/2021   CO2 22 07/26/2021    GLUCOSE 290 (H) 07/26/2021   BUN 12 07/26/2021   CREATININE 0.63 07/26/2021   BILITOT 0.3 07/26/2021   ALKPHOS 109 05/05/2020   AST 14 07/26/2021   ALT 16 07/26/2021   PROT 6.4 07/26/2021   ALBUMIN 3.9 05/05/2020   CALCIUM 8.5 (L) 07/26/2021   ANIONGAP 5 06/23/2021   EGFR 107 07/26/2021   Lab Results  Component Value Date   CHOL 97 07/26/2021   Lab Results  Component Value Date   HDL 31 (L) 07/26/2021   Lab Results  Component Value Date   LDLCALC 27 07/26/2021   Lab Results  Component Value Date   TRIG 373 (H) 07/26/2021   Lab Results  Component Value Date   CHOLHDL 3.1 07/26/2021   Lab Results  Component Value Date   HGBA1C 7.3 (A) 12/09/2021      Assessment & Plan:  Obstructive sleep apnea    Follow-up: No follow-ups on file.   Kara Dies, NP

## 2022-04-27 NOTE — Patient Instructions (Signed)
Sleep study result discussed. If have ear pain call the office for evaluation.

## 2022-05-02 ENCOUNTER — Telehealth: Payer: Self-pay | Admitting: Nurse Practitioner

## 2022-05-02 NOTE — Telephone Encounter (Signed)
Pt called stating she is checking on a cpap machine order

## 2022-05-03 ENCOUNTER — Ambulatory Visit (INDEPENDENT_AMBULATORY_CARE_PROVIDER_SITE_OTHER): Payer: Medicaid Other | Admitting: Dermatology

## 2022-05-03 VITALS — BP 100/62

## 2022-05-03 DIAGNOSIS — L814 Other melanin hyperpigmentation: Secondary | ICD-10-CM | POA: Diagnosis not present

## 2022-05-03 DIAGNOSIS — Z7189 Other specified counseling: Secondary | ICD-10-CM | POA: Diagnosis not present

## 2022-05-03 DIAGNOSIS — L219 Seborrheic dermatitis, unspecified: Secondary | ICD-10-CM

## 2022-05-03 DIAGNOSIS — L820 Inflamed seborrheic keratosis: Secondary | ICD-10-CM | POA: Diagnosis not present

## 2022-05-03 DIAGNOSIS — B079 Viral wart, unspecified: Secondary | ICD-10-CM | POA: Diagnosis not present

## 2022-05-03 DIAGNOSIS — L821 Other seborrheic keratosis: Secondary | ICD-10-CM | POA: Diagnosis not present

## 2022-05-03 DIAGNOSIS — I872 Venous insufficiency (chronic) (peripheral): Secondary | ICD-10-CM | POA: Diagnosis not present

## 2022-05-03 DIAGNOSIS — Z79899 Other long term (current) drug therapy: Secondary | ICD-10-CM | POA: Diagnosis not present

## 2022-05-03 MED ORDER — KETOCONAZOLE 2 % EX CREA: 1.0000 | TOPICAL_CREAM | CUTANEOUS | 11 refills | Status: AC

## 2022-05-03 NOTE — Patient Instructions (Addendum)
Stop Mometasone cream to legs, may restart if legs flare Start Cerave cream dail  Cont Ketoconazole 2% shampoo to scalp 1-3 times a week as needed Cont Ketoconazole 2% cream 1-3 times a week to face as needed for flares Cont Hydrocortisone 2.5% cream 1-3 times a week to face as needed for flares    Instructions for After In-Office Application of Cantharidin  1. This is a strong medicine; please follow ALL instructions.  2. Gently wash off with soap and water in four hours or sooner s directed by your physician.  3. **WARNING** this medicine can cause severe blistering, blood blisters, infection, and/or scarring if it is not washed off as directed.  4. Your progress will be rechecked in 1-2 months; call sooner if there are any questions or problems.   Cryotherapy Aftercare  Wash gently with soap and water everyday.   Apply Vaseline and Band-Aid daily until healed.   Due to recent changes in healthcare laws, you may see results of your pathology and/or laboratory studies on MyChart before the doctors have had a chance to review them. We understand that in some cases there may be results that are confusing or concerning to you. Please understand that not all results are received at the same time and often the doctors may need to interpret multiple results in order to provide you with the best plan of care or course of treatment. Therefore, we ask that you please give [place] 2 business days to thoroughly review all your results before contacting the office for clarification. Should we see a critical lab result, you will be contacted sooner.   If You Need Anything After Your Visit  If you have any questions or concerns for your doctor, please call our main line at [phone] and press option 4 to reach your doctor's medical assistant. If no one answers, please leave a voicemail as directed and we will return your call as soon as possible. Messages left after 4 pm will be answered the following  business day.   You may also send [place] a message via MyChart. We typically respond to MyChart messages within 1-2 business days.  For prescription refills, please ask your pharmacy to contact our office. Our fax number is [phone].  If you have an urgent issue when the clinic is closed that cannot wait until the next business day, you can page your doctor at the number below.    Please note that while we do our best to be available for urgent issues outside of office hours, we are not available 24/7.   If you have an urgent issue and are unable to reach [place], you may choose to seek medical care at your doctor's office, retail clinic, urgent care center, or emergency room.  If you have a medical emergency, please immediately call 911 or go to the emergency department.  Pager Numbers  - Dr. Gwen Pounds: [phone]  - Dr. Neale Burly: [phone]  - Dr. Roseanne Reno: [phone]  In the event of inclement weather, please call our main line at [phone] for an update on the status of any delays or closures.  Dermatology Medication Tips: Please keep the boxes that topical medications come in in order to help keep track of the instructions about where and how to use these. Pharmacies typically print the medication instructions only on the boxes and not directly on the medication tubes.   If your medication is too expensive, please contact our office at [phone] option 4 or send [place] a message  through MyChart.   We are unable to tell what your co-pay for medications will be in advance as this is different depending on your insurance coverage. However, we may be able to find a substitute medication at lower cost or fill out paperwork to get insurance to cover a needed medication.   If a prior authorization is required to get your medication covered by your insurance company, please allow [place] 1-2 business days to complete this process.  Drug prices often vary depending on where the prescription is  filled and some pharmacies may offer cheaper prices.  The website www.goodrx.com contains coupons for medications through different pharmacies. The prices here do not account for what the cost may be with help from insurance (it may be cheaper with your insurance), but the website can give you the price if you did not use any insurance.  - You can print the associated coupon and take it with your prescription to the pharmacy.  - You may also stop by our office during regular business hours and pick up a GoodRx coupon card.  - If you need your prescription sent electronically to a different pharmacy, notify our office through [facility] or by phone at [phone] option 4.     Si Usted Necesita Algo Despus de Su Visita  Tambin puede enviarnos un mensaje a travs de Clinical cytogeneticist. Por lo general respondemos a los mensajes de MyChart en el transcurso de 1 a 2 das hbiles.  Para renovar recetas, por favor pida a su farmacia que se ponga en contacto con nuestra oficina. Annie Sable de fax es [place] [phone].  Si tiene un asunto urgente cuando la clnica est cerrada y que no puede esperar hasta el siguiente da hbil, puede llamar/localizar a su doctor(a) al nmero que aparece a continuacin.   Por favor, tenga en cuenta que aunque hacemos todo lo posible para estar disponibles para asuntos urgentes fuera del horario de [place], no estamos disponibles las 24 horas del da, los 7 [address]  [address] de la [place].   Si tiene un problema urgente y no puede comunicarse con nosotros, puede optar por buscar atencin mdica  en el consultorio de su doctor(a), en una clnica privada, en un centro de atencin urgente o en una sala de emergencias.  Si tiene Engineer, drilling, por favor llame inmediatamente al 911 o vaya a la sala de emergencias.  Nmeros de bper  - Dr. Gwen Pounds: [phone]  - Dra. Moye: [phone]  - Dra. Roseanne Reno: [phone]  En caso de inclemencias del [place], por favor llame  a Lacy Duverney principal al [phone] para una actualizacin sobre el [place] de cualquier retraso o cierre.  Consejos para la medicacin en dermatologa: Por favor, guarde las cajas en las que vienen los medicamentos de uso tpico para ayudarle a seguir las instrucciones sobre dnde y cmo usarlos. Las farmacias generalmente imprimen las instrucciones del medicamento slo en las cajas y no directamente en los tubos del [place].   Si su medicamento es muy caro, por favor, pngase en contacto con Rolm Gala llamando al [phone] y presione la opcin 4 o envenos un mensaje a travs de Clinical cytogeneticist.   No podemos decirle cul ser su copago por los medicamentos por adelantado ya que esto es diferente dependiendo de la cobertura de su seguro. Sin embargo, es posible que podamos encontrar un medicamento sustituto a Audiological scientist un formulario para que el seguro cubra el medicamento que se considera necesario.   Si se requiere Futures trader  previa para que su compaa de seguros [place] su medicamento, por favor permtanos de 1 a 2 das hbiles para completar [address]  Los precios de los medicamentos varan con frecuencia dependiendo del Environmental consultant de dnde se surte la receta y alguna farmacias pueden ofrecer precios ms baratos.  El sitio web www.goodrx.com tiene cupones para medicamentos de Health and safety inspector. Los precios aqu no tienen en cuenta lo que podra costar con la ayuda del seguro (puede ser ms barato con su seguro), pero el sitio web puede darle el precio si no utiliz Tourist information centre manager.  - Puede imprimir el cupn correspondiente y llevarlo con su receta a la farmacia.  - Tambin puede pasar por nuestra oficina durante el horario de atencin regular y Education officer, museum una tarjeta de cupones de GoodRx.  - Si necesita que su receta se enve electrnicamente a una farmacia diferente, informe a nuestra oficina a travs de MyChart de [place] o por telfono llamando al [phone] y  presione la opcin 4.

## 2022-05-03 NOTE — Progress Notes (Signed)
Follow-Up Visit   Subjective  Heather Cain is a 52 y.o. female who presents for the following: Seb derm face, scalp, 11m f/u ketoconazole 2% shampoo not using, Ketoconazole 2% cr prn, HC 2.5% cr prn, Stasis derm legs, 10m f/u, Mometasone cr 5d/wk, Warts R thumb 54m f/u, LN2 at last visit, Wart vs Condyloma R sup labial x 2, LN2 last visit, check spot R axilla, growing The patient has spots, moles and lesions to be evaluated, some may be new or changing and the patient may have concern these could be cancer.  The following portions of the chart were reviewed this encounter and updated as appropriate: medications, allergies, medical history  Review of Systems:  No other skin or systemic complaints except as noted in HPI or Assessment and Plan.  Objective  Well appearing patient in no apparent distress; mood and affect are within normal limits.  A focused examination was performed of the following areas: R axilla, legs, right hand  Relevant exam findings are noted in the Assessment and Plan.  R thumb x 2 Verrucous papules -- Discussed viral etiology and contagion.   R ant axillary x 1 Stuck on waxy paps with erythema   Assessment & Plan   SEBORRHEIC DERMATITIS Face, scalp Exam: face and scalp clear  Seborrheic Dermatitis is a chronic persistent rash characterized by pinkness and scaling most commonly of the mid face but also can occur on the scalp (dandruff), ears; mid chest, mid back and groin.  It tends to be exacerbated by stress and cooler weather.  People who have neurologic disease may experience new onset or exacerbation of existing seborrheic dermatitis.  The condition is not curable but treatable and can be controlled.  Treatment Plan: Con Ketoconazole 2% shampoo 3d/wk prn flares Cont Ketoconazole 2% cr 3d/wk prn flares Cont HC 2.5% cr 3d/wk prn flares  Topical steroids (such as triamcinolone, fluocinolone, fluocinonide, mometasone, clobetasol, halobetasol,  betamethasone, hydrocortisone) can cause thinning and lightening of the skin if they are used for too long in the same area. Your physician has selected the right strength medicine for your problem and area affected on the body. Please use your medication only as directed by your physician to prevent side effects.    Viral warts, unspecified type R thumb x 2  Viral Wart (HPV) Counseling  Discussed viral / HPV (Human Papilloma Virus) etiology and risk of spread /infectivity to other areas of body as well as to other people.  Multiple treatments and methods may be required to clear warts and it is possible treatment may not be successful.  Treatment risks include discoloration; scarring and there is still potential for wart recurrence.  LN2 x 2 Squaric Acid 3% x 2 Cantharidin plus x 2  Squaric Acid 3% applied to left upper inner arm and covered with band aid. Patient has topical steroid available to treat topically if rash occurs.    Discussed pap smears and HPV  Destruction of lesion - R thumb x 2 Complexity: simple   Destruction method: cryotherapy   Informed consent: discussed and consent obtained   Timeout:  patient name, date of birth, surgical site, and procedure verified Lesion destroyed using liquid nitrogen: Yes   Region frozen until ice ball extended beyond lesion: Yes   Outcome: patient tolerated procedure well with no complications   Post-procedure details: wound care instructions given    Destruction of lesion - R thumb x 2  Destruction method: chemical removal   Informed consent: discussed and  consent obtained   Timeout:  patient name, date of birth, surgical site, and procedure verified Chemical destruction method comment:  Squaric acid, cantharidin plus Application time:  4 hours Procedure instructions: patient instructed to wash and dry area   Outcome: patient tolerated procedure well with no complications   Post-procedure details: wound care instructions given    Additional details:  Patient advised to set alarm to remind them to wash off with soap and water at the directed time.  Inflamed seborrheic keratosis R ant axillary x 1  Symptomatic, irritating, patient would like treated.   Destruction of lesion - R ant axillary x 1 Complexity: simple   Destruction method: cryotherapy   Informed consent: discussed and consent obtained   Timeout:  patient name, date of birth, surgical site, and procedure verified Lesion destroyed using liquid nitrogen: Yes   Region frozen until ice ball extended beyond lesion: Yes   Outcome: patient tolerated procedure well with no complications   Post-procedure details: wound care instructions given    STASIS DERMATITIS Exam: erythema lower legs, improved Stasis in the legs causes chronic leg swelling, which may result in itchy or painful rashes, skin discoloration, skin texture changes, and sometimes ulceration.  Recommend daily graduated compression hose/stockings- easiest to put on first thing in morning, remove at bedtime.  Elevate legs as much as possible. Avoid salt/sodium rich foods.  Treatment Plan: D/c Mometasone cr for now, may restart qd 5d/wk prn flares Recommend moisturizer qd  SEBORRHEIC KERATOSIS - Stuck-on, waxy, tan-brown papules and/or plaques  - Benign-appearing - Discussed benign etiology and prognosis. - Observe - Call for any changes  LENTIGINES Exam: scattered tan macules Due to sun exposure Treatment Plan: Benign-appearing, observe. Recommend daily broad spectrum sunscreen SPF 30+ to sun-exposed areas, reapply every 2 hours as needed.  Call for any changes  Return in about 4 months (around 09/02/2022) for warts f/u, 1 yr stasis derm, seb derm.  I, Ardis Rowan, RMA, am acting as scribe for Armida Sans, MD .  Documentation: I have reviewed the above documentation for accuracy and completeness, and I agree with the above.  Armida Sans, MD

## 2022-05-05 ENCOUNTER — Encounter: Payer: Self-pay | Admitting: Nurse Practitioner

## 2022-05-08 ENCOUNTER — Telehealth: Payer: Self-pay | Admitting: Nurse Practitioner

## 2022-05-08 NOTE — Telephone Encounter (Signed)
Left voicemail for pt to check with pharmacy. Should have refills remaining on Crestor.   Disp Refills Start End   rosuvastatin (CRESTOR) 20 MG tablet 30 tablet 4 02/21/2022    Sig - Route: Take 1 tablet (20 mg total) by mouth daily. - Oral   Sent to pharmacy as: rosuvastatin (CRESTOR) 20 MG tablet   E-Prescribing Status: Receipt confirmed by pharmacy (02/21/2022  8:18 AM EST)    Pharmacy  [id number] - [place], [place] - [address]

## 2022-05-08 NOTE — Telephone Encounter (Signed)
Prescription Request  05/08/2022  LOV: 04/27/2022  What is the name of the medication or equipment? rosuvastatin (CRESTOR) 20 MG tablet. Patient is leaving town tomorrow and would like to get it before she leaves town.   Have you contacted your pharmacy to request a refill? Yes   Which pharmacy would you like this sent to?   [facility] [address] [place] - [address] [place] [address] Phone: [phone] Fax: [phone]     Patient notified that their request is being sent to the clinical staff for review and that they should receive a response within 2 business days.   Please advise at Mobile [phone] (mobile)

## 2022-05-09 ENCOUNTER — Encounter: Payer: Self-pay | Admitting: Dermatology

## 2022-05-09 ENCOUNTER — Inpatient Hospital Stay: Payer: Medicaid Other | Admitting: Licensed Clinical Social Worker

## 2022-05-09 DIAGNOSIS — D0511 Intraductal carcinoma in situ of right breast: Secondary | ICD-10-CM

## 2022-05-10 NOTE — Progress Notes (Signed)
[facility] CSW Counseling Note  Patient was referred by medical provider. Treatment type: Individual  Presenting Concerns: Patient and/or family reports the following symptoms/concerns: anxiety, depression, stress, and adjustment post-op Duration of problem: 11 months; Severity of problem: mild   Orientation:oriented to person, place, time/date, situation, day of week, month of year, and year.   Affect: Depressed Risk of harm to self or others: No plan to harm self or others  Patient and/or Family's Strengths/Protective Factors: Social connections, Social and Emotional competence, Concrete supports in place (healthy food, safe environments, etc.), Sense of purpose, Physical Health (exercise, healthy diet, medication compliance, etc.), and Caregiver has knowledge of parenting & child developmentAbility for insight  Active sense of humor  Average or above average intelligence  Capable of independent living  Communication skills  General fund of knowledge  Motivation for treatment/growth  Physical Health  Special hobby/interest  Supportive family/friends  Work skills      Goals Addressed: Patient will:  Reduce symptoms of: anxiety, depression, stress, and adjustment concerns Increase knowledge and/or ability of: coping skills, healthy habits, and stress reduction  Increase healthy adjustment to current life circumstances, Increase adequate support systems for patient/family, Increase motivation to adhere to plan of care, and Begin healthy grieving over loss   Progress towards Goals: Progressing   Interventions: Interventions utilized:  CBT, Solution Focused, Strength-based, Family Systems, and Reframing      Assessment: Patient currently experiencing anxiety, mild symptoms of depression and adjustment difficulties to diagnosis and post-op recuperation. Patient continues to report improvement in overall feeling and decisions making. Patient stated she is experiencing less stress over  medical concerns, but is still dealing with financial concerns, stating her food stamps have been reduced to 24.00 a month, because she has started working part-time.. Patient reports considering different types of weight loss programs. Discussed continue improvement with communication with support system, mindfulness skills and body acceptance.  Discussed trusting and accepting support from support persons. Discussed effective communication with support persons.  .  .      Plan: Follow up with CSW: 05/30/2022 @ 3:00PM Behavioral recommendations:  continue using mindfulness skills, improve communication and acceptance skills    Referral(s): Support group(s):  N/A         Joseph Art, LCSW   Patient is participating in a Managed Medicaid Plan:  Yes

## 2022-05-11 NOTE — Assessment & Plan Note (Signed)
She does not have a restful sleep and she feels Tired and fatigued in the morning.  She is positive for obstructive sleep apnea from the Home sleep study done on 4/4//24. Orders faxed to [facility] for CPAP machine.

## 2022-05-19 DIAGNOSIS — G4733 Obstructive sleep apnea (adult) (pediatric): Secondary | ICD-10-CM | POA: Diagnosis not present

## 2022-05-30 ENCOUNTER — Inpatient Hospital Stay: Payer: Medicaid Other | Attending: Oncology | Admitting: Licensed Clinical Social Worker

## 2022-05-30 DIAGNOSIS — D0511 Intraductal carcinoma in situ of right breast: Secondary | ICD-10-CM

## 2022-06-01 ENCOUNTER — Telehealth: Payer: Self-pay | Admitting: Radiology

## 2022-06-01 ENCOUNTER — Encounter: Payer: Self-pay | Admitting: Medical Oncology

## 2022-06-01 DIAGNOSIS — D0511 Intraductal carcinoma in situ of right breast: Secondary | ICD-10-CM

## 2022-06-01 NOTE — Research (Signed)
MTG-015 - Tissue and Bodily Fluids: Translational Medicine: Discovery and Evaluation of Biomarkers/Pharmacogenomics for the Diagnosis and Personalized Management of Patients    Patient arrived to clinic for study provided gift card. Per Merri Brunette, Clinical Research Coordinator, patient has completed one year assessment and was stopping by at clinic today to pick up gift card.  Patient arrived, study $37 gift card provided, patient signed and dated,  acknowledging receipt.  Patient thanked.   Rexene Edison, RN, BSN, [facility] Clinical Research Nurse Lead 06/01/2022 3:14 PM

## 2022-06-01 NOTE — Telephone Encounter (Signed)
MTG-015 - Tissue and Bodily Fluids: Translational Medicine: Discovery and Evaluation of Biomarkers/Pharmacogenomics for the Diagnosis and Personalized Management of Patients    06/01/2022  PHONE CALL: LVM for patient to return call for the 1 yr follow up on the above mentioned study.   Merri Brunette, RT(R)(T) Clinical Research Coordinator   RETURNED PHONE CALL: Patient returned call for the above mentioned study. The coordinator informed patient reason for call is to complete the 1 year follow on the above mentioned study. Patient confirmed new meds include multiple creams and diclofenac (starting 01/2022 and 04/2022) for rash on hands/legs/face, semaglutide (04/2022) for weight loss. Patient confirmed all other medications were the same. Patient stated the only new diagnosis is sleep apnea. Patient stated she had a colonoscopy in Feb 2024 that showed pre-cancerous polyps.   Patient was thanked for her time and provided an option for how she would prefer to receive her $50 gift card for this follow-up. Patient requested to pick it up at [facility]. This coordinator expressed understanding and provided the phone number for Hale Drone, RN Clinical Research Nurse at [facility] to patient. This coordinator spoke with Hale Drone, RN to inform her patient would be coming by to pick up gift card.   Merri Brunette, RT(R)(T) Clinical Research Coordinator

## 2022-06-01 NOTE — Progress Notes (Signed)
[facility] CSW Counseling Note  Patient was referred by medical provider. Treatment type: Individual  Presenting Concerns: Patient and/or family reports the following symptoms/concerns: anxiety, depression, stress, and adjustment concerns Duration of problem: 12 months; Severity of problem: mild   Orientation:oriented to person, place, time/date, situation, day of week, month of year, and year.   Affect: Appropriate Risk of harm to self or others: No plan to harm self or others  Patient and/or Family's Strengths/Protective Factors: Social connections, Social and Emotional competence, Concrete supports in place (healthy food, safe environments, etc.), Sense of purpose, Physical Health (exercise, healthy diet, medication compliance, etc.), and Caregiver has knowledge of parenting & child developmentAbility for insight  Active sense of humor  Average or above average intelligence  Capable of independent living  Communication skills  General fund of knowledge  Motivation for treatment/growth  Physical Health  Special hobby/interest  Supportive family/friends  Work skills      Goals Addressed: Patient will:  Reduce symptoms of: anxiety, depression, stress, and adjustment concerns Increase knowledge and/or ability of: coping skills, healthy habits, and stress reduction  Increase healthy adjustment to current life circumstances, Begin healthy grieving over loss, and increase coping strategies and mindfulness skills   Progress towards Goals: Progressing   Interventions: Interventions utilized:  CBT, Solution Focused, Strength-based, Supportive, and Reframing      Assessment: Patient currently experiencing anxiety, mild symptoms of depression and adjustment difficulties to diagnosis and post-op recuperation. Patient continues to report improvement in overall feeling and decisions making. Patient reports increased awareness of triggers and emotional responses. Discussed continue improvement  with communication with support system, mindfulness skills and body acceptance.  Discussed trusting and accepting support from support persons. Discussed effective communication with support persons. CSW terminated counseling with patient and gave her local resources for additional services if needed.      Plan: Follow up with CSW: N/A Behavioral recommendations: continue using mindfulness skills, improve communication and acceptance skills    Referral(s): Other medical:  [facility]          Joseph Art, LCSW   Patient is participating in a Managed Medicaid Plan:  Yes

## 2022-06-07 ENCOUNTER — Telehealth: Payer: Self-pay | Admitting: Nurse Practitioner

## 2022-06-07 DIAGNOSIS — G4733 Obstructive sleep apnea (adult) (pediatric): Secondary | ICD-10-CM

## 2022-06-07 MED ORDER — FLUTICASONE PROPIONATE 50 MCG/ACT NA SUSP: NASAL | 0 refills | Status: DC

## 2022-06-07 NOTE — Telephone Encounter (Signed)
Refill sent.

## 2022-06-07 NOTE — Telephone Encounter (Signed)
Prescription Request  06/07/2022  LOV: 04/27/2022  What is the name of the medication or equipment?  fluticasone (FLONASE) 50 MCG/ACT nasal spray   Have you contacted your pharmacy to request a refill? No   Which pharmacy would you like this sent to?  [facility] [address], [place] - [address] [place] [address] Phone: [phone] Fax: [phone]     Patient notified that their request is being sent to the clinical staff for review and that they should receive a response within 2 business days.   Please advise at Mobile [phone] (mobile)

## 2022-06-08 ENCOUNTER — Encounter: Payer: Self-pay | Admitting: Nurse Practitioner

## 2022-06-08 DIAGNOSIS — M65321 Trigger finger, right index finger: Secondary | ICD-10-CM | POA: Diagnosis not present

## 2022-06-08 DIAGNOSIS — G629 Polyneuropathy, unspecified: Secondary | ICD-10-CM

## 2022-06-08 DIAGNOSIS — R200 Anesthesia of skin: Secondary | ICD-10-CM | POA: Diagnosis not present

## 2022-06-08 DIAGNOSIS — R7989 Other specified abnormal findings of blood chemistry: Secondary | ICD-10-CM

## 2022-06-08 DIAGNOSIS — E0843 Diabetes mellitus due to underlying condition with diabetic autonomic (poly)neuropathy: Secondary | ICD-10-CM | POA: Diagnosis not present

## 2022-06-08 DIAGNOSIS — R202 Paresthesia of skin: Secondary | ICD-10-CM | POA: Diagnosis not present

## 2022-06-08 DIAGNOSIS — E119 Type 2 diabetes mellitus without complications: Secondary | ICD-10-CM

## 2022-06-09 ENCOUNTER — Telehealth: Payer: Self-pay | Admitting: Nurse Practitioner

## 2022-06-09 NOTE — Telephone Encounter (Signed)
Noted on lab appt. Thanks

## 2022-06-12 ENCOUNTER — Other Ambulatory Visit: Payer: Medicaid Other

## 2022-06-12 ENCOUNTER — Other Ambulatory Visit (INDEPENDENT_AMBULATORY_CARE_PROVIDER_SITE_OTHER): Payer: Medicaid Other

## 2022-06-12 DIAGNOSIS — G629 Polyneuropathy, unspecified: Secondary | ICD-10-CM

## 2022-06-12 DIAGNOSIS — E119 Type 2 diabetes mellitus without complications: Secondary | ICD-10-CM | POA: Diagnosis not present

## 2022-06-12 DIAGNOSIS — R7989 Other specified abnormal findings of blood chemistry: Secondary | ICD-10-CM | POA: Diagnosis not present

## 2022-06-12 DIAGNOSIS — Z7985 Long-term (current) use of injectable non-insulin antidiabetic drugs: Secondary | ICD-10-CM

## 2022-06-13 LAB — MICROALBUMIN / CREATININE URINE RATIO
Creatinine,U: 98 mg/dL
Microalb Creat Ratio: 0.7 mg/g (ref 0.0–30.0)
Microalb, Ur: <0.7 mg/dL (ref 0.0–1.9)

## 2022-06-13 LAB — THYROID PANEL WITH TSH
Free Thyroxine Index: 2.1 (ref 1.4–3.8)
T3 Uptake: 30 % (ref 22–35)
T4, Total: 7.1 ug/dL (ref 5.1–11.9)
TSH: 0.02 mIU/L — ABNORMAL LOW

## 2022-06-13 LAB — URIC ACID: Uric Acid, Serum: 5.1 mg/dL (ref 2.4–7.0)

## 2022-06-15 ENCOUNTER — Other Ambulatory Visit: Payer: Self-pay | Admitting: Nurse Practitioner

## 2022-06-15 ENCOUNTER — Ambulatory Visit: Payer: Medicaid Other | Admitting: Nurse Practitioner

## 2022-06-15 DIAGNOSIS — G4733 Obstructive sleep apnea (adult) (pediatric): Secondary | ICD-10-CM

## 2022-06-15 MED ORDER — FLUTICASONE PROPIONATE 50 MCG/ACT NA SUSP: NASAL | 1 refills | Status: DC

## 2022-06-15 NOTE — Progress Notes (Signed)
flonase

## 2022-06-19 DIAGNOSIS — G4733 Obstructive sleep apnea (adult) (pediatric): Secondary | ICD-10-CM | POA: Diagnosis not present

## 2022-06-22 ENCOUNTER — Ambulatory Visit: Payer: Medicaid Other | Admitting: Nurse Practitioner

## 2022-06-22 ENCOUNTER — Encounter: Payer: Self-pay | Admitting: Nurse Practitioner

## 2022-06-22 VITALS — BP 124/76 | HR 82 | Temp 97.9°F | Ht 63.0 in | Wt 318.2 lb

## 2022-06-22 DIAGNOSIS — F419 Anxiety disorder, unspecified: Secondary | ICD-10-CM | POA: Diagnosis not present

## 2022-06-22 DIAGNOSIS — Z23 Encounter for immunization: Secondary | ICD-10-CM

## 2022-06-22 DIAGNOSIS — G629 Polyneuropathy, unspecified: Secondary | ICD-10-CM

## 2022-06-22 DIAGNOSIS — R7989 Other specified abnormal findings of blood chemistry: Secondary | ICD-10-CM | POA: Diagnosis not present

## 2022-06-22 DIAGNOSIS — E119 Type 2 diabetes mellitus without complications: Secondary | ICD-10-CM | POA: Diagnosis not present

## 2022-06-22 DIAGNOSIS — R202 Paresthesia of skin: Secondary | ICD-10-CM

## 2022-06-22 DIAGNOSIS — Z7985 Long-term (current) use of injectable non-insulin antidiabetic drugs: Secondary | ICD-10-CM

## 2022-06-22 MED ORDER — ACCU-CHEK SOFTCLIX LANCETS MISC
4 refills | Status: DC
Start: 2022-06-22 — End: 2023-09-25

## 2022-06-22 MED ORDER — ACCU-CHEK GUIDE VI STRP
ORAL_STRIP | 4 refills | Status: DC
Start: 2022-06-22 — End: 2023-03-12

## 2022-06-22 MED ORDER — SEMAGLUTIDE (1 MG/DOSE) 4 MG/3ML ~~LOC~~ SOPN: 1.0000 mg | PEN_INJECTOR | SUBCUTANEOUS | 3 refills | Status: DC

## 2022-06-22 NOTE — Patient Instructions (Signed)
Referral referral sent to psychologist. TSH lab ordered please schedule the lab next week. Please call the neurologist regarding the referral for rheumatology.

## 2022-06-22 NOTE — Progress Notes (Signed)
Established Patient Office Visit  Subjective:  Patient ID: Heather Cain, female    DOB: 1970/07/14  Age: 52 y.o. MRN: [id number]  CC:  Chief Complaint  Patient presents with   Medical Management of Chronic Issues    Hands tingling numbness joint pain    HPI  Heather Cain presents for routine follow up.  She has history of diabetes, hyperlipidemia, lower leg edema, s/p bilateral mastectomy, neuropathy  She has been seen by the neurologist 3 weeks ago for numbness and tingling.  She reports that the numbness and tingling is more prominent in left hand compared to the right. She has been advised to take gabapentin 200 mg twice a day by the neurologist and referral has been placed to the rheumatologist for evaluation of possible trigger finger.   She is s/p bilateral mastectomy and would like to see cognitive behavioral therapy.  She was followed by therapist at cancer center previously.  HPI   Past Medical History:  Diagnosis Date   DCIS (ductal carcinoma in situ) of breast    Diabetes mellitus without complication (HCC)    Family history of bladder cancer    Family history of uterine cancer    High cholesterol    Neuromuscular disorder (HCC)    peripheral neuropathy   PONV (postoperative nausea and vomiting)    Vaginal Pap smear, abnormal     Past Surgical History:  Procedure Laterality Date   BREAST BIOPSY Right 03/08/2021   right breast bx coil clip -positive   BREAST BIOPSY Right 03/24/2021   stereo biopsy x clip/DUCTAL CARCINOMA IN SITU (DCIS),   BREAST BIOPSY Left 05/18/2021   ribbon clip   BREAST BIOPSY Right 05/18/2021   coil clip   COLONOSCOPY WITH PROPOFOL N/A 03/08/2022   Procedure: COLONOSCOPY WITH PROPOFOL;  Surgeon: Wyline Mood, MD;  Location: [facility] ENDOSCOPY;  Service: Gastroenterology;  Laterality: N/A;  REQUESTED LAST CASE   CRYOTHERAPY     EVACUATION BREAST HEMATOMA Right 06/16/2021   Procedure: EVACUATION HEMATOMA BREAST;  Surgeon: Campbell Lerner, MD;  Location: [facility] ORS;  Service: General;  Laterality: Right;   MASTECTOMY     SIMPLE MASTECTOMY WITH AXILLARY SENTINEL NODE BIOPSY Right 06/15/2021   Procedure: SIMPLE MASTECTOMY WITH AXILLARY SENTINEL NODE BIOPSY, Total;  Surgeon: Campbell Lerner, MD;  Location: [facility] ORS;  Service: General;  Laterality: Right;   TOTAL MASTECTOMY Left 06/15/2021   Procedure: TOTAL MASTECTOMY, prophylactic left;  Surgeon: Campbell Lerner, MD;  Location: [facility] ORS;  Service: General;  Laterality: Left;   WRIST SURGERY      Family History  Problem Relation Age of Onset   Uterine cancer Mother 8   Bladder Cancer Father 63   Skin cancer Maternal Aunt    Cancer - Other Maternal Grandfather        possible colon cancer   Breast cancer Maternal Great-grandmother     Social History   Socioeconomic History   Marital status: Single    Spouse name: Not on file   Number of children: Not on file   Years of education: Not on file   Highest education level: Some college, no degree  Occupational History   Not on file  Tobacco Use   Smoking status: Former    Packs/day: 1    Types: Cigarettes    Quit date: 05/30/2021    Years since quitting: 1.0   Smokeless tobacco: Never  Vaping Use   Vaping Use: Every day  Substance and Sexual Activity  Alcohol use: Yes    Comment: social   Drug use: Yes    Types: Marijuana   Sexual activity: Not Currently    Birth control/protection: None  Other Topics Concern   Not on file  Social History Narrative   Lives in [place]; self; no children; 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 former Smoke ; rare alcohol.    Social Determinants of Health   Financial Resource Strain: High Risk (04/23/2022)   Overall Financial Resource Strain (CARDIA)    Difficulty of Paying Living Expenses: Hard  Food Insecurity: Food Insecurity Present (04/23/2022)   Hunger Vital Sign    Worried About Running Out of Food in the Last Year: Sometimes true    Ran Out of Food in the Last Year: Sometimes true   Transportation Needs: No Transportation Needs (04/23/2022)   PRAPARE - Administrator, Civil Service (Medical): No    Lack of Transportation (Non-Medical): No  Physical Activity: Insufficiently Active (04/23/2022)   Exercise Vital Sign    Days of Exercise per Week: 3 days    Minutes of Exercise per Session: 30 min  Stress: No Stress Concern Present (04/23/2022)   [employer] of Occupational Health - Occupational Stress Questionnaire    Feeling of Stress : Only a little  Recent Concern: Stress - Stress Concern Present (03/16/2022)   [employer] of Occupational Health - Occupational Stress Questionnaire    Feeling of Stress : To some extent  Social Connections: Socially Isolated (04/23/2022)   Social Connection and Isolation Panel [NHANES]    Frequency of Communication with Friends and Family: More than three times a week    Frequency of Social Gatherings with Friends and Family: Once a week    Attends Religious Services: Never    Database administrator or Organizations: No    Attends Banker Meetings: Never    Marital Status: Never married  Intimate Partner Violence: Not At Risk (03/16/2022)   Humiliation, Afraid, Rape, and Kick questionnaire    Fear of Current or Ex-Partner: No    Emotionally Abused: No    Physically Abused: No    Sexually Abused: No     Outpatient Medications Prior to Visit  Medication Sig Dispense Refill   albuterol (VENTOLIN HFA) 108 (90 Base) MCG/ACT inhaler Inhale 2 puffs into the lungs every 6 (six) hours as needed for wheezing or shortness of breath. 8 g 0   fluticasone (FLONASE) 50 MCG/ACT nasal spray Use 2 spray(s) in each nostril once daily 16 g 1   furosemide (LASIX) 20 MG tablet Take 1 tablet (20 mg total) by mouth daily. 30 tablet 4   gabapentin (NEURONTIN) 100 MG capsule TAKE 1 CAPSULE BY MOUTH TWICE DAILY FOR 7 DAYS THEN INCREASE TO 2 CAPS TWICE DAILY AND CONTINUE     glimepiride (AMARYL) 4 MG tablet Take 1 tablet (4  mg total) by mouth daily before breakfast. 90 tablet 1   hydrocortisone 2.5 % cream apply to face once a day Tues, Thurs, Sat 30 g 11   ketoconazole (NIZORAL) 2 % shampoo apply three times per week, massage into scalp and leave in for 10 minutes before rinsing out 120 mL 3   loratadine (CLARITIN) 10 MG tablet Take 10 mg by mouth daily.     mometasone (ELOCON) 0.1 % cream apply to legs 5 nights a week 45 g 6   rosuvastatin (CRESTOR) 20 MG tablet Take 1 tablet (20 mg total) by mouth daily. 30 tablet 4   Accu-Chek Softclix  Lancets lancets Use as instructed 100 each 4   glucose blood (ACCU-CHEK GUIDE) test strip Use as directed 50 each 4   Semaglutide,0.25 or 0.5MG /DOS, 2 MG/3ML SOPN Inject 0.5 mg into the skin once a week. 3 mL 2   diclofenac (VOLTAREN) 75 MG EC tablet Take by mouth. (Patient not taking: Reported on 06/22/2022)     No facility-administered medications prior to visit.    Allergies  Allergen Reactions   Codeine Itching and Anaphylaxis    ROS Review of Systems Negative unless indicated in HPI.    Objective:    Physical Exam Constitutional:      Appearance: Normal appearance. She is normal weight.  HENT:     Head: Normocephalic.     Right Ear: Tympanic membrane normal.     Left Ear: Tympanic membrane normal.     Mouth/Throat:     Mouth: Mucous membranes are moist.  Eyes:     Extraocular Movements: Extraocular movements intact.     Conjunctiva/sclera: Conjunctivae normal.     Pupils: Pupils are equal, round, and reactive to light.  Neck:     Thyroid: No thyroid mass or thyroid tenderness.  Cardiovascular:     Rate and Rhythm: Normal rate and regular rhythm.     Pulses: Normal pulses.     Heart sounds: Normal heart sounds. No murmur heard. Pulmonary:     Effort: Pulmonary effort is normal.     Breath sounds: Normal breath sounds.  Abdominal:     General: Bowel sounds are normal.     Palpations: Abdomen is soft. There is no mass.     Tenderness: There is no  abdominal tenderness. There is no rebound.  Musculoskeletal:        General: No swelling.     Cervical back: Neck supple. No tenderness.     Right lower leg: No edema.     Left lower leg: No edema.  Skin:    Findings: No bruising, erythema or rash.  Neurological:     General: No focal deficit present.     Mental Status: She is alert and oriented to person, place, and time. Mental status is at baseline.  Psychiatric:        Mood and Affect: Mood normal.        Behavior: Behavior normal.        Thought Content: Thought content normal.        Judgment: Judgment normal.     BP 124/76   Pulse 82   Temp 97.9 F (36.6 C) (Oral)   Ht 5\' 3"  (1.6 m)   Wt (!) 318 lb 3.2 oz (144.3 kg)   SpO2 96%   BMI 56.37 kg/m  Wt Readings from Last 3 Encounters:  06/22/22 (!) 318 lb 3.2 oz (144.3 kg)  04/27/22 (!) 317 lb 3.2 oz (143.9 kg)  03/08/22 (!) 320 lb 12.8 oz (145.5 kg)     Health Maintenance  Topic Date Due   HIV Screening  Never done   Hepatitis C Screening  Never done   COVID-19 Vaccine (3 - Moderna risk series) 05/13/2019   OPHTHALMOLOGY EXAM  05/25/2022   HEMOGLOBIN A1C  06/10/2022   Diabetic kidney evaluation - eGFR measurement  07/27/2022   FOOT EXAM  07/23/2022   INFLUENZA VACCINE  08/10/2022   MAMMOGRAM  04/28/2023   PAP SMEAR-Modifier  05/06/2023   Diabetic kidney evaluation - Urine ACR  06/12/2023   Colonoscopy  03/08/2025   DTaP/Tdap/Td (3 - Td or Tdap)  02/11/2032   Zoster Vaccines- Shingrix  Completed   HPV VACCINES  Aged Out    There are no preventive care reminders to display for this patient.  Lab Results  Component Value Date   TSH 0.03 (L) 06/26/2022   Lab Results  Component Value Date   WBC 5.9 07/26/2021   HGB 11.6 (L) 07/26/2021   HCT 37.2 07/26/2021   MCV 91.0 07/26/2021   PLT 208 07/26/2021   Lab Results  Component Value Date   NA 136 07/26/2021   K 4.2 07/26/2021   CO2 22 07/26/2021   GLUCOSE 290 (H) 07/26/2021   BUN 12 07/26/2021    CREATININE 0.63 07/26/2021   BILITOT 0.3 07/26/2021   ALKPHOS 109 05/05/2020   AST 14 07/26/2021   ALT 16 07/26/2021   PROT 6.4 07/26/2021   ALBUMIN 3.9 05/05/2020   CALCIUM 8.5 (L) 07/26/2021   ANIONGAP 5 06/23/2021   EGFR 107 07/26/2021   Lab Results  Component Value Date   CHOL 97 07/26/2021   Lab Results  Component Value Date   HDL 31 (L) 07/26/2021   Lab Results  Component Value Date   LDLCALC 27 07/26/2021   Lab Results  Component Value Date   TRIG 373 (H) 07/26/2021   Lab Results  Component Value Date   CHOLHDL 3.1 07/26/2021   Lab Results  Component Value Date   HGBA1C 7.3 (A) 12/09/2021      Assessment & Plan:  Type 2 diabetes mellitus without complication, without long-term current use of insulin (HCC) Assessment & Plan: Advised pt to check the BS regularly, make a log and bring to next appointment.  Advised pt to monitor diet. Advised pt to eat variety of food including fruits, vegetables, whole grains, complex carbohydrates and proteins.  Increase Ozempic from 0.5 to 1 Mg weekly.  Call continue glimepiride 4 mg daily.   Orders: -     Accu-Chek Softclix Lancets; Use as instructed  Dispense: 100 each; Refill: 4 -     Accu-Chek Guide; Use as directed  Dispense: 50 each; Refill: 4  Anxiety Assessment & Plan: GAD score 8. Referral has been sent for cognitive behavioral therapy.  Orders: -     Ambulatory referral to Psychology  Abnormal thyroid stimulating hormone (TSH) level Assessment & Plan: Previous TSH 0.02.on 06/12/22 Will repeat thyroid panel   Orders: -     Thyroid Panel With TSH; Future  Need for shingles vaccine Assessment & Plan: Shingles vaccine provided IM.  Orders: -     Varicella-zoster vaccine IM  Neuropathy Assessment & Plan: Continue gabapentin 200 mg twice a day. Followed by neurologist. Lab Results  Component Value Date   LABURIC 5.1 06/12/2022       Other orders -     Semaglutide (1 MG/DOSE); Inject 1 mg  as directed once a week.  Dispense: 3 mL; Refill: 3    Follow-up: No follow-ups on file.   Kara Dies, NP

## 2022-06-26 ENCOUNTER — Other Ambulatory Visit (INDEPENDENT_AMBULATORY_CARE_PROVIDER_SITE_OTHER): Payer: Medicaid Other

## 2022-06-26 DIAGNOSIS — R7989 Other specified abnormal findings of blood chemistry: Secondary | ICD-10-CM | POA: Diagnosis not present

## 2022-06-27 LAB — THYROID PANEL WITH TSH
Free Thyroxine Index: 2.1 (ref 1.4–3.8)
T3 Uptake: 30 % (ref 22–35)
T4, Total: 6.9 ug/dL (ref 5.1–11.9)
TSH: 0.03 mIU/L — ABNORMAL LOW

## 2022-07-02 ENCOUNTER — Encounter: Payer: Self-pay | Admitting: Nurse Practitioner

## 2022-07-02 DIAGNOSIS — F419 Anxiety disorder, unspecified: Secondary | ICD-10-CM | POA: Insufficient documentation

## 2022-07-02 DIAGNOSIS — Z23 Encounter for immunization: Secondary | ICD-10-CM | POA: Insufficient documentation

## 2022-07-02 NOTE — Assessment & Plan Note (Signed)
GAD score 8. Referral has been sent for cognitive behavioral therapy.

## 2022-07-02 NOTE — Assessment & Plan Note (Signed)
Shingles vaccine provided IM.

## 2022-07-02 NOTE — Assessment & Plan Note (Signed)
Continue gabapentin 200 mg twice a day. Followed by neurologist. Lab Results  Component Value Date   LABURIC 5.1 06/12/2022

## 2022-07-02 NOTE — Assessment & Plan Note (Addendum)
Previous TSH 0.02.on 06/12/22 Will repeat thyroid panel

## 2022-07-02 NOTE — Assessment & Plan Note (Signed)
Advised pt to check the BS regularly, make a log and bring to next appointment.  Advised pt to monitor diet. Advised pt to eat variety of food including fruits, vegetables, whole grains, complex carbohydrates and proteins.  Increase Ozempic from 0.5 to 1 Mg weekly.  Call continue glimepiride 4 mg daily.

## 2022-07-03 ENCOUNTER — Encounter: Payer: Self-pay | Admitting: Nurse Practitioner

## 2022-07-05 ENCOUNTER — Telehealth: Payer: Self-pay | Admitting: Nurse Practitioner

## 2022-07-05 NOTE — Telephone Encounter (Addendum)
Pt called back stating she does not know anyone in this area and that is why she wanted her provider to do it. Pt stated she does not know s**t about it and stated she will call her insurance company and see what they say. Pt stated [facility] a [facility] based program and does medication therapy not actual therapy. Pt stated she need actual therapy and not medication

## 2022-07-05 NOTE — Telephone Encounter (Signed)
Lft pt vm to call ofc so I can inform pt to call her ins to see whom her ins will allow her to see. Thank you!

## 2022-07-12 DIAGNOSIS — F4310 Post-traumatic stress disorder, unspecified: Secondary | ICD-10-CM | POA: Diagnosis not present

## 2022-07-19 DIAGNOSIS — G4733 Obstructive sleep apnea (adult) (pediatric): Secondary | ICD-10-CM | POA: Diagnosis not present

## 2022-07-19 DIAGNOSIS — F4310 Post-traumatic stress disorder, unspecified: Secondary | ICD-10-CM | POA: Diagnosis not present

## 2022-07-28 ENCOUNTER — Other Ambulatory Visit: Payer: Self-pay

## 2022-07-28 DIAGNOSIS — I491 Atrial premature depolarization: Secondary | ICD-10-CM | POA: Diagnosis not present

## 2022-07-28 DIAGNOSIS — R519 Headache, unspecified: Secondary | ICD-10-CM | POA: Diagnosis not present

## 2022-07-28 DIAGNOSIS — R29818 Other symptoms and signs involving the nervous system: Secondary | ICD-10-CM | POA: Diagnosis not present

## 2022-07-28 DIAGNOSIS — R55 Syncope and collapse: Secondary | ICD-10-CM | POA: Diagnosis not present

## 2022-07-28 DIAGNOSIS — I493 Ventricular premature depolarization: Secondary | ICD-10-CM | POA: Diagnosis not present

## 2022-07-28 LAB — CBC WITH DIFFERENTIAL/PLATELET
Abs Immature Granulocytes: 0.04 10*3/uL (ref 0.00–0.07)
Basophils Absolute: 0 10*3/uL (ref 0.0–0.1)
Basophils Relative: 0 %
Eosinophils Absolute: 0.1 10*3/uL (ref 0.0–0.5)
Eosinophils Relative: 1 %
HCT: 40.9 % (ref 36.0–46.0)
Hemoglobin: 13.4 g/dL (ref 12.0–15.0)
Immature Granulocytes: 0 %
Lymphocytes Relative: 35 %
Lymphs Abs: 3.2 10*3/uL (ref 0.7–4.0)
MCH: 28.2 pg (ref 26.0–34.0)
MCHC: 32.8 g/dL (ref 30.0–36.0)
MCV: 86.1 fL (ref 80.0–100.0)
Monocytes Absolute: 0.6 10*3/uL (ref 0.1–1.0)
Monocytes Relative: 7 %
Neutro Abs: 5.1 10*3/uL (ref 1.7–7.7)
Neutrophils Relative %: 57 %
Platelets: 233 10*3/uL (ref 150–400)
RBC: 4.75 MIL/uL (ref 3.87–5.11)
RDW: 15.3 % (ref 11.5–15.5)
WBC: 9.1 10*3/uL (ref 4.0–10.5)
nRBC: 0 % (ref 0.0–0.2)

## 2022-07-28 LAB — BASIC METABOLIC PANEL
Anion gap: 10 (ref 5–15)
BUN: 16 mg/dL (ref 6–20)
CO2: 23 mmol/L (ref 22–32)
Calcium: 9 mg/dL (ref 8.9–10.3)
Chloride: 103 mmol/L (ref 98–111)
Creatinine, Ser: 0.66 mg/dL (ref 0.44–1.00)
GFR, Estimated: >60 mL/min (ref >60)
Glucose, Bld: 164 mg/dL — ABNORMAL HIGH (ref 70–99)
Potassium: 3.7 mmol/L (ref 3.5–5.1)
Sodium: 136 mmol/L (ref 135–145)

## 2022-07-28 NOTE — ED Triage Notes (Addendum)
Pt states she was pumping gas and became weak and had tunnel vision, pt reports she felt like she was about to have syncopal episode. Pt also reports a headache. Denies vision changes at this time but c/o dizziness.

## 2022-07-29 ENCOUNTER — Emergency Department
Admission: EM | Admit: 2022-07-29 | Discharge: 2022-07-29 | Disposition: A | Discharge status: Home / Self Care | Payer: Medicaid Other | Attending: Student in an Organized Health Care Education/Training Program | Admitting: Student in an Organized Health Care Education/Training Program

## 2022-07-29 ENCOUNTER — Emergency Department: Payer: Medicaid Other

## 2022-07-29 DIAGNOSIS — R29818 Other symptoms and signs involving the nervous system: Secondary | ICD-10-CM | POA: Diagnosis not present

## 2022-07-29 DIAGNOSIS — R519 Headache, unspecified: Secondary | ICD-10-CM | POA: Diagnosis not present

## 2022-07-29 DIAGNOSIS — R55 Syncope and collapse: Secondary | ICD-10-CM

## 2022-07-29 DIAGNOSIS — I491 Atrial premature depolarization: Secondary | ICD-10-CM

## 2022-07-29 LAB — URINALYSIS, ROUTINE W REFLEX MICROSCOPIC
Bilirubin Urine: NEGATIVE
Glucose, UA: NEGATIVE mg/dL
Hgb urine dipstick: NEGATIVE
Ketones, ur: NEGATIVE mg/dL
Leukocytes,Ua: NEGATIVE
Nitrite: NEGATIVE
Protein, ur: NEGATIVE mg/dL
Specific Gravity, Urine: 1.018 (ref 1.005–1.030)
pH: 6 (ref 5.0–8.0)

## 2022-07-29 LAB — POC URINE PREG, ED: Preg Test, Ur: NEGATIVE

## 2022-07-29 LAB — TROPONIN I (HIGH SENSITIVITY): Troponin I (High Sensitivity): 3 ng/L (ref <18)

## 2022-07-29 MED ORDER — SODIUM CHLORIDE 0.9 % IV BOLUS
500.0000 mL | Freq: Once | INTRAVENOUS | Status: AC
  Administered 2022-07-29: 500 mL via INTRAVENOUS

## 2022-07-29 NOTE — ED Provider Notes (Signed)
[facility] Provider Note    Event Date/Time   First MD Initiated Contact with Patient 07/29/22 0037     (approximate)   History   Near Syncope   HPI  Heather Cain is a 52 y.o. female who presents to the ER for evaluation of lightheadedness and dizziness has been going on for few days associated with mild headache and symptoms of tunnel vision.  Has had some tingling in both of her hands.  Admits to being under increasing stress over the past few days.  Denies any abdominal pain.  No numbness or tingling.  No fevers.  No dysuria.  States she is also felt like her heart is racing and palpitations.  Denies any history of heart disease.     Physical Exam   Triage Vital Signs: ED Triage Vitals  Encounter Vitals Group     BP 07/28/22 2301 113/85     Systolic BP Percentile --      Diastolic BP Percentile --      Pulse Rate 07/28/22 2301 97     Resp 07/28/22 2301 16     Temp 07/28/22 2301 97.8 F (36.6 C)     Temp src --      SpO2 07/28/22 2301 98 %     Weight 07/28/22 2300 (!) 314 lb (142.4 kg)     Height 07/28/22 2300 5\' 3"  (1.6 m)     Head Circumference --      Peak Flow --      Pain Score 07/28/22 2300 0     Pain Loc --      Pain Education --      Exclude from Growth Chart --     Most recent vital signs: Vitals:   07/29/22 0155 07/29/22 0235  BP:  96/65  Pulse: 82 77  Resp: 17 16  Temp:    SpO2: 100% 98%     Constitutional: Alert  Eyes: Conjunctivae are normal.  Head: Atraumatic. Nose: No congestion/rhinnorhea. Mouth/Throat: Mucous membranes are moist.   Neck: Painless ROM.  Cardiovascular:   Good peripheral circulation. Respiratory: Normal respiratory effort.  No retractions.  Gastrointestinal: Soft and nontender.  Musculoskeletal:  no deformity Neurologic:  MAE spontaneously. No gross focal neurologic deficits are appreciated.  Skin:  Skin is warm, dry and intact. No rash noted. Psychiatric: Mood and affect are normal.  Speech and behavior are normal.    ED Results / Procedures / Treatments   Labs (all labs ordered are listed, but only abnormal results are displayed) Labs Reviewed  BASIC METABOLIC PANEL - Abnormal; Notable for the following components:      Result Value   Glucose, Bld 164 (*)    All other components within normal limits  URINALYSIS, ROUTINE W REFLEX MICROSCOPIC - Abnormal; Notable for the following components:   Color, Urine YELLOW (*)    APPearance CLEAR (*)    All other components within normal limits  CBC WITH DIFFERENTIAL/PLATELET  CBG MONITORING, ED  POC URINE PREG, ED  TROPONIN I (HIGH SENSITIVITY)     EKG  ED ECG REPORT I, Willy Eddy, the attending physician, personally viewed and interpreted this ECG.   Date: 07/29/2022  EKG Time: 23:07  Rate: 85  Rhythm: sinus  Axis: normal  Intervals: normal  ST&T Change: no stemi, no depressions    RADIOLOGY Please see ED Course for my review and interpretation.  I personally reviewed all radiographic images ordered to evaluate for the above acute complaints  and reviewed radiology reports and findings.  These findings were personally discussed with the patient.  Please see medical record for radiology report.    PROCEDURES:  Critical Care performed:   Procedures   MEDICATIONS ORDERED IN ED: Medications  sodium chloride 0.9 % bolus 500 mL (500 mLs Intravenous New Bag/Given 07/29/22 0124)     IMPRESSION / MDM / ASSESSMENT AND PLAN / ED COURSE  I reviewed the triage vital signs and the nursing notes.                              Differential diagnosis includes, but is not limited to, dehydration, electrolyte abnormality, anemia, mass, IPH, UTI, dysrhythmia  Patient presenting to the ER for evaluation of symptoms as described above.  Based on symptoms, risk factors and considered above differential, this presenting complaint could reflect a potentially life-threatening illness therefore the patient will  be placed on continuous pulse oximetry and telemetry for monitoring.  Laboratory evaluation will be sent to evaluate for the above complaints.  Her EKG is nonischemic but she is having occasional PACs which would explain some of her palpitations.  Her neuroexam is nonfocal but given headache with symptoms will order CT to evaluate for the but differential. Clinical Course as of 07/29/22 0329  Sat Jul 29, 2022  0149 X-ray my review and interpretation without evidence of consolidation or edema. [PR]    Clinical Course User Index [PR] Willy Eddy, MD   Imaging is reassuring.  Patient remains well-appearing in no acute distress.  Does appear stable appropriate for outpatient follow-up.  Has PCP appointment on Monday.  FINAL CLINICAL IMPRESSION(S) / ED DIAGNOSES   Final diagnoses:  PAC (premature atrial contraction)  Near syncope     Rx / DC Orders   ED Discharge Orders     None        Note:  This document was prepared using Dragon voice recognition software and may include unintentional dictation errors.    Willy Eddy, MD 07/29/22 [phone]

## 2022-07-31 ENCOUNTER — Other Ambulatory Visit (INDEPENDENT_AMBULATORY_CARE_PROVIDER_SITE_OTHER): Payer: Medicaid Other

## 2022-07-31 DIAGNOSIS — R7989 Other specified abnormal findings of blood chemistry: Secondary | ICD-10-CM

## 2022-08-01 ENCOUNTER — Ambulatory Visit: Payer: Medicaid Other | Admitting: Nurse Practitioner

## 2022-08-01 ENCOUNTER — Encounter: Payer: Self-pay | Admitting: Nurse Practitioner

## 2022-08-01 VITALS — BP 128/82 | HR 84 | Temp 97.4°F | Ht 63.0 in | Wt 319.0 lb

## 2022-08-01 DIAGNOSIS — R002 Palpitations: Secondary | ICD-10-CM

## 2022-08-01 DIAGNOSIS — H53483 Generalized contraction of visual field, bilateral: Secondary | ICD-10-CM | POA: Diagnosis not present

## 2022-08-01 DIAGNOSIS — R000 Tachycardia, unspecified: Secondary | ICD-10-CM

## 2022-08-01 LAB — T3, FREE: T3, Free: 3.5 pg/mL (ref 2.3–4.2)

## 2022-08-01 LAB — T4, FREE: Free T4: 0.78 ng/dL (ref 0.60–1.60)

## 2022-08-01 LAB — TSH: TSH: 0.06 u[IU]/mL — ABNORMAL LOW (ref 0.35–5.50)

## 2022-08-01 MED ORDER — FUROSEMIDE 20 MG PO TABS: 20.0000 mg | ORAL_TABLET | Freq: Every day | ORAL | 4 refills | Status: DC

## 2022-08-01 MED ORDER — ROSUVASTATIN CALCIUM 20 MG PO TABS: 20.0000 mg | ORAL_TABLET | Freq: Every day | ORAL | 4 refills | Status: DC

## 2022-08-01 NOTE — Progress Notes (Signed)
Established Patient Office Visit  Subjective:  Patient ID: Heather Cain, female    DOB: 1970-08-02  Age: 52 y.o. MRN: [id number]  CC:  Chief Complaint  Patient presents with   Palpitations    HPI  Heather Cain presents for routine follow up and was switched to acute visit due to her symptoms.  She was seen in the ED on 07/29/22  for evaluation of lightheadedness, palpitations, dizziness headache and tunnel vision.  She states that the symptom was going on from 07/28/22.   The EKG during the ED visit showed  occasional PACs.  Troponin, chest x-ray and CT of the head reassuring.  Patient states that since discharge she has off-and-on palpitations slight headache and tunnel vision.  Describes a tunnel vision remains for few seconds and goes away.  Denies shortness of breath, chest pain, one-sided weakness.  She has history of anxiety depression and PTSD.  She is on therapy for PTSD that started few days ago.  Patient states that she is in a lot of stress.  Patient states she has 2-3 small cups of caffeine intake, 32 ounces of water and use furosemide on Monday, Wednesday and Friday.  HPI   Past Medical History:  Diagnosis Date   DCIS (ductal carcinoma in situ) of breast    Diabetes mellitus without complication (HCC)    Family history of bladder cancer    Family history of uterine cancer    High cholesterol    Neuromuscular disorder (HCC)    peripheral neuropathy   PONV (postoperative nausea and vomiting)    Vaginal Pap smear, abnormal     Past Surgical History:  Procedure Laterality Date   BREAST BIOPSY Right 03/08/2021   right breast bx coil clip -positive   BREAST BIOPSY Right 03/24/2021   stereo biopsy x clip/DUCTAL CARCINOMA IN SITU (DCIS),   BREAST BIOPSY Left 05/18/2021   ribbon clip   BREAST BIOPSY Right 05/18/2021   coil clip   COLONOSCOPY WITH PROPOFOL N/A 03/08/2022   Procedure: COLONOSCOPY WITH PROPOFOL;  Surgeon: Wyline Mood, MD;  Location: [facility] ENDOSCOPY;   Service: Gastroenterology;  Laterality: N/A;  REQUESTED LAST CASE   CRYOTHERAPY     EVACUATION BREAST HEMATOMA Right 06/16/2021   Procedure: EVACUATION HEMATOMA BREAST;  Surgeon: Campbell Lerner, MD;  Location: [facility] ORS;  Service: General;  Laterality: Right;   MASTECTOMY     SIMPLE MASTECTOMY WITH AXILLARY SENTINEL NODE BIOPSY Right 06/15/2021   Procedure: SIMPLE MASTECTOMY WITH AXILLARY SENTINEL NODE BIOPSY, Total;  Surgeon: Campbell Lerner, MD;  Location: [facility] ORS;  Service: General;  Laterality: Right;   TOTAL MASTECTOMY Left 06/15/2021   Procedure: TOTAL MASTECTOMY, prophylactic left;  Surgeon: Campbell Lerner, MD;  Location: [facility] ORS;  Service: General;  Laterality: Left;   WRIST SURGERY      Family History  Problem Relation Age of Onset   Uterine cancer Mother 62   Bladder Cancer Father 54   Skin cancer Maternal Aunt    Cancer - Other Maternal Grandfather        possible colon cancer   Breast cancer Maternal Great-grandmother     Social History   Socioeconomic History   Marital status: Single    Spouse name: Not on file   Number of children: Not on file   Years of education: Not on file   Highest education level: Some college, no degree  Occupational History   Not on file  Tobacco Use   Smoking status: Former  Current packs/day: 0.00    Types: Cigarettes    Quit date: 05/30/2021    Years since quitting: 1.1   Smokeless tobacco: Never  Vaping Use   Vaping status: Every Day  Substance and Sexual Activity   Alcohol use: Yes    Comment: social   Drug use: Yes    Types: Marijuana   Sexual activity: Not Currently    Birth control/protection: None  Other Topics Concern   Not on file  Social History Narrative   Lives in [place]; self; no children; 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 former Smoke ; rare alcohol.    Social Determinants of Health   Financial Resource Strain: High Risk (04/23/2022)   Overall Financial Resource Strain (CARDIA)    Difficulty of Paying Living  Expenses: Hard  Food Insecurity: Food Insecurity Present (04/23/2022)   Hunger Vital Sign    Worried About Running Out of Food in the Last Year: Sometimes true    Ran Out of Food in the Last Year: Sometimes true  Transportation Needs: No Transportation Needs (04/23/2022)   PRAPARE - Administrator, Civil Service (Medical): No    Lack of Transportation (Non-Medical): No  Physical Activity: Insufficiently Active (04/23/2022)   Exercise Vital Sign    Days of Exercise per Week: 3 days    Minutes of Exercise per Session: 30 min  Stress: No Stress Concern Present (04/23/2022)   [employer] of Occupational Health - Occupational Stress Questionnaire    Feeling of Stress : Only a little  Recent Concern: Stress - Stress Concern Present (03/16/2022)   [employer] of Occupational Health - Occupational Stress Questionnaire    Feeling of Stress : To some extent  Social Connections: Socially Isolated (04/23/2022)   Social Connection and Isolation Panel [NHANES]    Frequency of Communication with Friends and Family: More than three times a week    Frequency of Social Gatherings with Friends and Family: Once a week    Attends Religious Services: Never    Database administrator or Organizations: No    Attends Banker Meetings: Never    Marital Status: Never married  Intimate Partner Violence: Not At Risk (03/16/2022)   Humiliation, Afraid, Rape, and Kick questionnaire    Fear of Current or Ex-Partner: No    Emotionally Abused: No    Physically Abused: No    Sexually Abused: No     Outpatient Medications Prior to Visit  Medication Sig Dispense Refill   Accu-Chek Softclix Lancets lancets Use as instructed 100 each 4   albuterol (VENTOLIN HFA) 108 (90 Base) MCG/ACT inhaler Inhale 2 puffs into the lungs every 6 (six) hours as needed for wheezing or shortness of breath. 8 g 0   fluticasone (FLONASE) 50 MCG/ACT nasal spray Use 2 spray(s) in each nostril once daily 16 g 1    gabapentin (NEURONTIN) 100 MG capsule TAKE 1 CAPSULE BY MOUTH TWICE DAILY FOR 7 DAYS THEN INCREASE TO 2 CAPS TWICE DAILY AND CONTINUE     glimepiride (AMARYL) 4 MG tablet Take 1 tablet (4 mg total) by mouth daily before breakfast. 90 tablet 1   glucose blood (ACCU-CHEK GUIDE) test strip Use as directed 50 each 4   hydrocortisone 2.5 % cream apply to face once a day Tues, Thurs, Sat 30 g 11   ketoconazole (NIZORAL) 2 % shampoo apply three times per week, massage into scalp and leave in for 10 minutes before rinsing out 120 mL 3   loratadine (CLARITIN) 10 MG  tablet Take 10 mg by mouth daily.     mometasone (ELOCON) 0.1 % cream apply to legs 5 nights a week 45 g 6   Semaglutide, 1 MG/DOSE, 4 MG/3ML SOPN Inject 1 mg as directed once a week. 3 mL 3   furosemide (LASIX) 20 MG tablet Take 1 tablet (20 mg total) by mouth daily. 30 tablet 4   rosuvastatin (CRESTOR) 20 MG tablet Take 1 tablet (20 mg total) by mouth daily. 30 tablet 4   No facility-administered medications prior to visit.    Allergies  Allergen Reactions   Codeine Itching and Anaphylaxis    ROS Review of Systems Negative unless indicated in HPI.    Objective:    Physical Exam Constitutional:      Appearance: Normal appearance.  HENT:     Head: Normocephalic.  Cardiovascular:     Rate and Rhythm: Normal rate and regular rhythm.     Pulses: Normal pulses.     Heart sounds: Normal heart sounds. No murmur heard. Pulmonary:     Effort: Pulmonary effort is normal.     Breath sounds: Normal breath sounds. No stridor. No wheezing.  Musculoskeletal:        General: No tenderness or deformity. Normal range of motion.  Skin:    General: Skin is warm and dry.  Neurological:     General: No focal deficit present.     Mental Status: She is alert and oriented to person, place, and time. Mental status is at baseline.  Psychiatric:        Mood and Affect: Mood normal.        Behavior: Behavior normal.        Thought Content:  Thought content normal.        Judgment: Judgment normal.     BP 128/82   Pulse 84   Temp (!) 97.4 F (36.3 C)   Ht 5\' 3"  (1.6 m)   Wt (!) 319 lb (144.7 kg)   SpO2 99%   BMI 56.51 kg/m  Wt Readings from Last 3 Encounters:  08/01/22 (!) 319 lb (144.7 kg)  07/28/22 (!) 314 lb (142.4 kg)  06/22/22 (!) 318 lb 3.2 oz (144.3 kg)     Health Maintenance  Topic Date Due   HIV Screening  Never done   Hepatitis C Screening  Never done   OPHTHALMOLOGY EXAM  05/25/2022   HEMOGLOBIN A1C  06/10/2022   FOOT EXAM  07/23/2022   COVID-19 Vaccine (3 - Moderna risk series) 08/17/2022 (Originally 05/13/2019)   INFLUENZA VACCINE  08/10/2022   MAMMOGRAM  04/28/2023   PAP SMEAR-Modifier  05/06/2023   Diabetic kidney evaluation - Urine ACR  06/12/2023   Diabetic kidney evaluation - eGFR measurement  07/28/2023   Colonoscopy  03/08/2025   DTaP/Tdap/Td (3 - Td or Tdap) 02/11/2032   Zoster Vaccines- Shingrix  Completed   HPV VACCINES  Aged Out    There are no preventive care reminders to display for this patient.  Lab Results  Component Value Date   TSH 0.06 (L) 07/31/2022   Lab Results  Component Value Date   WBC 9.1 07/28/2022   HGB 13.4 07/28/2022   HCT 40.9 07/28/2022   MCV 86.1 07/28/2022   PLT 233 07/28/2022   Lab Results  Component Value Date   NA 136 07/28/2022   K 3.7 07/28/2022   CO2 23 07/28/2022   GLUCOSE 164 (H) 07/28/2022   BUN 16 07/28/2022   CREATININE 0.66 07/28/2022   BILITOT  0.3 07/26/2021   ALKPHOS 109 05/05/2020   AST 14 07/26/2021   ALT 16 07/26/2021   PROT 6.4 07/26/2021   ALBUMIN 3.9 05/05/2020   CALCIUM 9.0 07/28/2022   ANIONGAP 10 07/28/2022   EGFR 107 07/26/2021   Lab Results  Component Value Date   CHOL 97 07/26/2021   Lab Results  Component Value Date   HDL 31 (L) 07/26/2021   Lab Results  Component Value Date   LDLCALC 27 07/26/2021   Lab Results  Component Value Date   TRIG 373 (H) 07/26/2021   Lab Results  Component Value Date    CHOLHDL 3.1 07/26/2021   Lab Results  Component Value Date   HGBA1C 7.3 (A) 12/09/2021      Assessment & Plan:  Palpitation Assessment & Plan: EKG abnormal.  Will place on Zio monitor. Referral placed for cardiology. Advised pt to avoid stimulant and adequate hydration. Red flag discussed.  Patient was provided clear instructions to go to ER or urgent care if symptoms does not improve, red flag or new problem develops.  Patient verbalized understanding.    Orders: -     LONG TERM MONITOR (3-14 DAYS); Future -     Ambulatory referral to Cardiology  Tunnel visual field constriction of both eyes -     Ambulatory referral to Ophthalmology  Racing heart beat -     EKG 12-Lead  Other orders -     Furosemide; Take 1 tablet (20 mg total) by mouth daily.  Dispense: 30 tablet; Refill: 4 -     Rosuvastatin Calcium; Take 1 tablet (20 mg total) by mouth daily.  Dispense: 30 tablet; Refill: 4    Follow-up: No follow-ups on file.   Kara Dies, NP

## 2022-08-02 ENCOUNTER — Ambulatory Visit: Payer: Medicaid Other | Attending: Nurse Practitioner

## 2022-08-02 DIAGNOSIS — R002 Palpitations: Secondary | ICD-10-CM

## 2022-08-04 DIAGNOSIS — H5203 Hypermetropia, bilateral: Secondary | ICD-10-CM | POA: Diagnosis not present

## 2022-08-04 DIAGNOSIS — R002 Palpitations: Secondary | ICD-10-CM | POA: Diagnosis not present

## 2022-08-04 DIAGNOSIS — E119 Type 2 diabetes mellitus without complications: Secondary | ICD-10-CM | POA: Diagnosis not present

## 2022-08-04 LAB — HM DIABETES EYE EXAM

## 2022-08-07 DIAGNOSIS — F4310 Post-traumatic stress disorder, unspecified: Secondary | ICD-10-CM | POA: Diagnosis not present

## 2022-08-08 DIAGNOSIS — H5213 Myopia, bilateral: Secondary | ICD-10-CM | POA: Diagnosis not present

## 2022-08-09 ENCOUNTER — Other Ambulatory Visit: Payer: Self-pay | Admitting: Nurse Practitioner

## 2022-08-09 DIAGNOSIS — R002 Palpitations: Secondary | ICD-10-CM | POA: Insufficient documentation

## 2022-08-09 DIAGNOSIS — H53483 Generalized contraction of visual field, bilateral: Secondary | ICD-10-CM | POA: Insufficient documentation

## 2022-08-09 NOTE — Assessment & Plan Note (Signed)
EKG abnormal.  Will place on Zio monitor. Referral placed for cardiology. Advised pt to avoid stimulant and adequate hydration. Red flag discussed.  Patient was provided clear instructions to go to ER or urgent care if symptoms does not improve, red flag or new problem develops.  Patient verbalized understanding.

## 2022-08-10 ENCOUNTER — Ambulatory Visit (INDEPENDENT_AMBULATORY_CARE_PROVIDER_SITE_OTHER): Payer: Medicaid Other | Admitting: Dermatology

## 2022-08-10 VITALS — BP 112/62

## 2022-08-10 DIAGNOSIS — Z7189 Other specified counseling: Secondary | ICD-10-CM | POA: Diagnosis not present

## 2022-08-10 DIAGNOSIS — Z79899 Other long term (current) drug therapy: Secondary | ICD-10-CM

## 2022-08-10 DIAGNOSIS — B079 Viral wart, unspecified: Secondary | ICD-10-CM

## 2022-08-10 NOTE — Patient Instructions (Addendum)
Instructions for After In-Office Application of Cantharidin  1. This is a strong medicine; please follow ALL instructions.  2. Gently wash off with soap and water in four - six hours or sooner s directed by your physician.  3. **WARNING** this medicine can cause severe blistering, blood blisters, infection, and/or scarring if it is not washed off as directed.  4. Your progress will be rechecked in 1-2 months; call sooner if there are any questions or problems.  Cryotherapy Aftercare  Wash gently with soap and water everyday.   Apply Vaseline and Band-Aid daily until healed.   Due to recent changes in healthcare laws, you may see results of your pathology and/or laboratory studies on MyChart before the doctors have had a chance to review them. We understand that in some cases there may be results that are confusing or concerning to you. Please understand that not all results are received at the same time and often the doctors may need to interpret multiple results in order to provide you with the best plan of care or course of treatment. Therefore, we ask that you please give [place] 2 business days to thoroughly review all your results before contacting the office for clarification. Should we see a critical lab result, you will be contacted sooner.   If You Need Anything After Your Visit  If you have any questions or concerns for your doctor, please call our main line at [phone] and press option 4 to reach your doctor's medical assistant. If no one answers, please leave a voicemail as directed and we will return your call as soon as possible. Messages left after 4 pm will be answered the following business day.   You may also send [place] a message via MyChart. We typically respond to MyChart messages within 1-2 business days.  For prescription refills, please ask your pharmacy to contact our office. Our fax number is [phone].  If you have an urgent issue when the clinic is closed that  cannot wait until the next business day, you can page your doctor at the number below.    Please note that while we do our best to be available for urgent issues outside of office hours, we are not available 24/7.   If you have an urgent issue and are unable to reach [place], you may choose to seek medical care at your doctor's office, retail clinic, urgent care center, or emergency room.  If you have a medical emergency, please immediately call 911 or go to the emergency department.  Pager Numbers  - Dr. Gwen Pounds: [phone]  - Dr. Roseanne Reno: [phone]  In the event of inclement weather, please call our main line at [phone] for an update on the status of any delays or closures.  Dermatology Medication Tips: Please keep the boxes that topical medications come in in order to help keep track of the instructions about where and how to use these. Pharmacies typically print the medication instructions only on the boxes and not directly on the medication tubes.   If your medication is too expensive, please contact our office at [phone] option 4 or send [place] a message through MyChart.   We are unable to tell what your co-pay for medications will be in advance as this is different depending on your insurance coverage. However, we may be able to find a substitute medication at lower cost or fill out paperwork to get insurance to cover a needed medication.   If a prior authorization is required to get  your medication covered by your insurance company, please allow [place] 1-2 business days to complete this process.  Drug prices often vary depending on where the prescription is filled and some pharmacies may offer cheaper prices.  The website www.goodrx.com contains coupons for medications through different pharmacies. The prices here do not account for what the cost may be with help from insurance (it may be cheaper with your insurance), but the website can give you the price if you did not use any  insurance.  - You can print the associated coupon and take it with your prescription to the pharmacy.  - You may also stop by our office during regular business hours and pick up a GoodRx coupon card.  - If you need your prescription sent electronically to a different pharmacy, notify our office through [facility] or by phone at [phone] option 4.     Si Usted Necesita Algo Despus de Su Visita  Tambin puede enviarnos un mensaje a travs de Clinical cytogeneticist. Por lo general respondemos a los mensajes de MyChart en el transcurso de 1 a 2 das hbiles.  Para renovar recetas, por favor pida a su farmacia que se ponga en contacto con nuestra oficina. Annie Sable de fax es [place] [phone].  Si tiene un asunto urgente cuando la clnica est cerrada y que no puede esperar hasta el siguiente da hbil, puede llamar/localizar a su doctor(a) al nmero que aparece a continuacin.   Por favor, tenga en cuenta que aunque hacemos todo lo posible para estar disponibles para asuntos urgentes fuera del horario de [place], no estamos disponibles las 24 horas del da, los 7 [address]  [address] de la [place].   Si tiene un problema urgente y no puede comunicarse con nosotros, puede optar por buscar atencin mdica  en el consultorio de su doctor(a), en una clnica privada, en un centro de atencin urgente o en una sala de emergencias.  Si tiene Engineer, drilling, por favor llame inmediatamente al 911 o vaya a la sala de emergencias.  Nmeros de bper  - Dr. Gwen Pounds: [phone]  - Dra. Roseanne Reno: [phone]  En caso de inclemencias del [place], por favor llame a Lacy Duverney principal al [phone] para una actualizacin sobre el [place] de cualquier retraso o cierre.  Consejos para la medicacin en dermatologa: Por favor, guarde las cajas en las que vienen los medicamentos de uso tpico para ayudarle a seguir las instrucciones sobre dnde y cmo usarlos. Las farmacias generalmente imprimen las  instrucciones del medicamento slo en las cajas y no directamente en los tubos del [place].   Si su medicamento es muy caro, por favor, pngase en contacto con Rolm Gala llamando al [phone] y presione la opcin 4 o envenos un mensaje a travs de Clinical cytogeneticist.   No podemos decirle cul ser su copago por los medicamentos por adelantado ya que esto es diferente dependiendo de la cobertura de su seguro. Sin embargo, es posible que podamos encontrar un medicamento sustituto a Audiological scientist un formulario para que el seguro cubra el medicamento que se considera necesario.   Si se requiere una autorizacin previa para que su compaa de seguros [place] su medicamento, por favor permtanos de 1 a 2 das hbiles para completar [address]  Los precios de los medicamentos varan con frecuencia dependiendo del Environmental consultant de dnde se surte la receta y alguna farmacias pueden ofrecer precios ms baratos.  El sitio web www.goodrx.com tiene cupones para medicamentos de Health and safety inspector. Los precios aqu no tienen en  cuenta lo que podra costar con la ayuda del seguro (puede ser ms barato con su seguro), pero el sitio web puede darle el precio si no Visual merchandiser.  - Puede imprimir el cupn correspondiente y llevarlo con su receta a la farmacia.  - Tambin puede pasar por nuestra oficina durante el horario de atencin regular y Education officer, museum una tarjeta de cupones de GoodRx.  - Si necesita que su receta se enve electrnicamente a una farmacia diferente, informe a nuestra oficina a travs de MyChart de [place] o por telfono llamando al [phone] y presione la opcin 4.

## 2022-08-10 NOTE — Progress Notes (Signed)
   Follow-Up Visit   Subjective  Heather Cain is a 52 y.o. female who presents for the following: wart R thumb x 2, 44m f/u, LN2 Squaric acid, Cantharidin plus, Squaric Acid sensitization on 05/03/22, not much improvement The patient has spots, moles and lesions to be evaluated, some may be new or changing and the patient may have concern these could be cancer.   The following portions of the chart were reviewed this encounter and updated as appropriate: medications, allergies, medical history  Review of Systems:  No other skin or systemic complaints except as noted in HPI or Assessment and Plan.  Objective  Well appearing patient in no apparent distress; mood and affect are within normal limits.   A focused examination was performed of the following areas: Right hand  Relevant exam findings are noted in the Assessment and Plan.  R thumb x 2 Verrucous paps 0.4cm, and 0.7cm    Assessment & Plan     Viral warts, unspecified type R thumb x 2  Viral Wart (HPV) Counseling  Discussed viral / HPV (Human Papilloma Virus) etiology and risk of spread /infectivity to other areas of body as well as to other people.  Multiple treatments and methods may be required to clear warts and it is possible treatment may not be successful.  Treatment risks include discoloration; scarring and there is still potential for wart recurrence.  LN2 x 2 Squaric Acid 3% x 2 Cantharidin plus x 2  Discussed IL injections of Candida  Destruction of lesion - R thumb x 2 Complexity: simple   Destruction method: cryotherapy   Informed consent: discussed and consent obtained   Timeout:  patient name, date of birth, surgical site, and procedure verified Lesion destroyed using liquid nitrogen: Yes   Region frozen until ice ball extended beyond lesion: Yes   Outcome: patient tolerated procedure well with no complications   Post-procedure details: wound care instructions given    Destruction of lesion - R  thumb x 2  Destruction method: chemical removal   Informed consent: discussed and consent obtained   Timeout:  patient name, date of birth, surgical site, and procedure verified Chemical destruction method comment:  Squaric Acid 3%, Cantharidin plus Procedure instructions: patient instructed to wash and dry area   Outcome: patient tolerated procedure well with no complications   Post-procedure details: wound care instructions given   Additional details:  Cantharidin Plus is a blistering agent that comes from a beetle.  It needs to be washed off in about 4-6 hours after application.  Although it is painless when applied in office, it may cause symptoms of mild pain and burning several hours later.  Treated areas will swell and turn red, and blisters may form.  Vaseline and a bandaid may be applied until wound has healed.  Once healed, the skin may remain temporarily discolored.  It can take weeks to months for pigmentation to return to normal.  Advised to wash off with soap and water in 4-6 hours or sooner if it becomes tender before then.     Return in about 3 months (around 11/10/2022) for wart f/u.  I, Ardis Rowan, RMA, am acting as scribe for Armida Sans, MD .   Documentation: I have reviewed the above documentation for accuracy and completeness, and I agree with the above.  Armida Sans, MD

## 2022-08-15 ENCOUNTER — Encounter: Payer: Self-pay | Admitting: Dermatology

## 2022-08-16 DIAGNOSIS — F4310 Post-traumatic stress disorder, unspecified: Secondary | ICD-10-CM | POA: Diagnosis not present

## 2022-08-19 DIAGNOSIS — G4733 Obstructive sleep apnea (adult) (pediatric): Secondary | ICD-10-CM | POA: Diagnosis not present

## 2022-08-28 DIAGNOSIS — F4310 Post-traumatic stress disorder, unspecified: Secondary | ICD-10-CM | POA: Diagnosis not present

## 2022-08-29 ENCOUNTER — Ambulatory Visit: Payer: Medicaid Other | Attending: Cardiology | Admitting: Cardiology

## 2022-08-29 ENCOUNTER — Encounter: Payer: Self-pay | Admitting: Cardiology

## 2022-08-29 ENCOUNTER — Telehealth: Payer: Self-pay | Admitting: *Deleted

## 2022-08-29 VITALS — BP 110/72 | HR 86 | Ht 63.0 in | Wt 324.4 lb

## 2022-08-29 DIAGNOSIS — R002 Palpitations: Secondary | ICD-10-CM

## 2022-08-29 DIAGNOSIS — I471 Supraventricular tachycardia, unspecified: Secondary | ICD-10-CM

## 2022-08-29 DIAGNOSIS — R600 Localized edema: Secondary | ICD-10-CM

## 2022-08-29 DIAGNOSIS — E119 Type 2 diabetes mellitus without complications: Secondary | ICD-10-CM

## 2022-08-29 DIAGNOSIS — D6869 Other thrombophilia: Secondary | ICD-10-CM

## 2022-08-29 DIAGNOSIS — G4733 Obstructive sleep apnea (adult) (pediatric): Secondary | ICD-10-CM | POA: Diagnosis not present

## 2022-08-29 DIAGNOSIS — I480 Paroxysmal atrial fibrillation: Secondary | ICD-10-CM | POA: Diagnosis not present

## 2022-08-29 MED ORDER — APIXABAN 5 MG PO TABS
5.0000 mg | ORAL_TABLET | Freq: Two times a day (BID) | ORAL | 3 refills | Status: DC
Start: 2022-08-29 — End: 2022-12-25

## 2022-08-29 MED ORDER — METOPROLOL SUCCINATE ER 25 MG PO TB24
25.0000 mg | ORAL_TABLET | Freq: Every day | ORAL | 1 refills | Status: DC
Start: 2022-08-29 — End: 2022-09-29

## 2022-08-29 NOTE — Telephone Encounter (Signed)
   From: Lanier Prude, MD  Sent: 08/27/2022   8:13 AM EDT  To: Oneida Arenas; Dalia Heading; *   Monitor shows new AF. Based on protocol, will need urgent cardiology referral.   Sabrina/Nancy, can you get this patient an appointment with our clinic next week please.   Thanks!   Sheria Lang

## 2022-08-29 NOTE — Telephone Encounter (Signed)
Spoke with patient and she is coming in today for her appointment.

## 2022-08-29 NOTE — Patient Instructions (Addendum)
Medication Instructions:  START Eliquis 5 mg twice daily  START Metoprolol Succinate (Toprol XL) 25 mg nightly   STOP Aspirin   *If you need a refill on your cardiac medications before your next appointment, please call your pharmacy*   Testing/Procedures:  Your physician has requested that you have an echocardiogram. Echocardiography is a painless test that uses sound waves to create images of your heart. It provides your doctor with information about the size and shape of your heart and how well your heart's chambers and valves are working.   You may receive an ultrasound enhancing agent through an IV if needed to better visualize your heart during the echo. This procedure takes approximately one hour.  There are no restrictions for this procedure.  This will take place at [address] ([facility]) [address], [place] [address]    Follow-Up: At [facility], you and your health needs are our priority.  As part of our continuing mission to provide you with exceptional heart care, we have created designated Provider Care Teams.  These Care Teams include your primary Cardiologist (physician) and Advanced Practice Providers (APPs -  Physician Assistants and Nurse Practitioners) who all work together to provide you with the care you need, when you need it.  We recommend signing up for the patient portal called "MyChart".  Sign up information is provided on this After Visit Summary.  MyChart is used to connect with patients for Virtual Visits (Telemedicine).  Patients are able to view lab/test results, encounter notes, upcoming appointments, etc.  Non-urgent messages can be sent to your provider as well.   To learn more about what you can do with MyChart, go to [email or web address].    Your next appointment:   1 month(s)  Provider:   Sherie Don, NP

## 2022-08-29 NOTE — Progress Notes (Deleted)
Cardiology Office Note Date:  08/29/2022  Patient ID:  Heather, Cain 05/08/1970, MRN [id number] PCP:  Kara Dies, NP  Cardiologist:  None Electrophysiologist: None  ***refresh   Chief Complaint: SVT, AFib  History of Present Illness: Heather Cain is a 52 y.o. female with PMH notable for T2DM, HLD, lower leg edema, s/p bilateral mastectomy; seen today for None for {VISITTYPE:28148}    Device Information: ***  AAD History: ***  Past Medical History:  Diagnosis Date   DCIS (ductal carcinoma in situ) of breast    Diabetes mellitus without complication (HCC)    Family history of bladder cancer    Family history of uterine cancer    High cholesterol    Neuromuscular disorder (HCC)    peripheral neuropathy   PONV (postoperative nausea and vomiting)    Vaginal Pap smear, abnormal     Past Surgical History:  Procedure Laterality Date   BREAST BIOPSY Right 03/08/2021   right breast bx coil clip -positive   BREAST BIOPSY Right 03/24/2021   stereo biopsy x clip/DUCTAL CARCINOMA IN SITU (DCIS),   BREAST BIOPSY Left 05/18/2021   ribbon clip   BREAST BIOPSY Right 05/18/2021   coil clip   COLONOSCOPY WITH PROPOFOL N/A 03/08/2022   Procedure: COLONOSCOPY WITH PROPOFOL;  Surgeon: Wyline Mood, MD;  Location: [facility];  Service: Gastroenterology;  Laterality: N/A;  REQUESTED LAST CASE   CRYOTHERAPY     EVACUATION BREAST HEMATOMA Right 06/16/2021   Procedure: EVACUATION HEMATOMA BREAST;  Surgeon: Campbell Lerner, MD;  Location: [facility] ORS;  Service: General;  Laterality: Right;   MASTECTOMY     SIMPLE MASTECTOMY WITH AXILLARY SENTINEL NODE BIOPSY Right 06/15/2021   Procedure: SIMPLE MASTECTOMY WITH AXILLARY SENTINEL NODE BIOPSY, Total;  Surgeon: Campbell Lerner, MD;  Location: [facility] ORS;  Service: General;  Laterality: Right;   TOTAL MASTECTOMY Left 06/15/2021   Procedure: TOTAL MASTECTOMY, prophylactic left;  Surgeon: Campbell Lerner, MD;  Location: [facility] ORS;   Service: General;  Laterality: Left;   WRIST SURGERY      Current Outpatient Medications  Medication Instructions   Accu-Chek Softclix Lancets lancets Use as instructed   albuterol (VENTOLIN HFA) 108 (90 Base) MCG/ACT inhaler 2 puffs, Inhalation, Every 6 hours PRN   fluticasone (FLONASE) 50 MCG/ACT nasal spray Use 2 spray(s) in each nostril once daily   furosemide (LASIX) 20 mg, Oral, Daily   gabapentin (NEURONTIN) 100 MG capsule TAKE 1 CAPSULE BY MOUTH TWICE DAILY FOR 7 DAYS THEN INCREASE TO 2 CAPS TWICE DAILY AND CONTINUE   glimepiride (AMARYL) 4 mg, Oral, Daily before breakfast   glucose blood (ACCU-CHEK GUIDE) test strip Use as directed   hydrocortisone 2.5 % cream apply to face once a day Tues, Thurs, Sat   ketoconazole (NIZORAL) 2 % shampoo apply three times per week, massage into scalp and leave in for 10 minutes before rinsing out   loratadine (CLARITIN) 10 mg, Oral, Daily   mometasone (ELOCON) 0.1 % cream apply to legs 5 nights a week   rosuvastatin (CRESTOR) 20 mg, Oral, Daily   Semaglutide (1 MG/DOSE) 1 mg, Injection, Weekly    Social History:  The patient  reports that she quit smoking about 15 months ago. Her smoking use included cigarettes. She has never used smokeless tobacco. She reports current alcohol use. She reports current drug use. Drug: Marijuana.   Family History:  *** include only if pertinent The patient's family history includes Bladder Cancer (age of onset: 38) in her father;  Breast cancer in her maternal great-grandmother; Cancer - Other in her maternal grandfather; Skin cancer in her maternal aunt; Uterine cancer (age of onset: 56) in her mother.***  ROS:  Please see the history of present illness. All other systems are reviewed and otherwise negative.   PHYSICAL EXAM: *** VS:  BP 110/72 (BP Location: Left Arm, Patient Position: Sitting, Cuff Size: Large)   Pulse 86   Ht 5\' 3"  (1.6 m)   Wt (!) 324 lb 6.4 oz (147.1 kg)   SpO2 95%   BMI 57.46 kg/m  BMI:  Body mass index is 57.46 kg/m.  GEN- The patient is well appearing, alert and oriented x 3 today.   Lungs- Clear to ausculation bilaterally, normal work of breathing.  Heart- {Blank single:19197::"Regular","Irregularly irregular"} rate and rhythm, no murmurs, rubs or gallops Extremities- {EDEMA LEVEL:28147::"No"} peripheral edema, warm, dry Skin-  *** device pocket well-healed, no tethering   Device interrogation done today and reviewed by myself:  Battery *** Lead thresholds, impedence, sensing stable *** *** episodes *** changes made today  EKG {ACTION; IS/IS YQM:[id number]} ordered. Personal review of EKG from {Blank single:19197::"today","***"} shows:  ***        Recent Labs: 07/28/2022: BUN 16; Creatinine, Ser 0.66; Hemoglobin 13.4; Platelets 233; Potassium 3.7; Sodium 136 07/31/2022: TSH 0.06  No results found for requested labs within last 365 days.   CrCl cannot be calculated (Patient's most recent lab result is older than the maximum 21 days allowed.).   Wt Readings from Last 3 Encounters:  08/29/22 (!) 324 lb 6.4 oz (147.1 kg)  08/01/22 (!) 319 lb (144.7 kg)  07/28/22 (!) 314 lb (142.4 kg)     Additional studies reviewed include: Previous EP, cardiology notes.     ASSESSMENT AND PLAN:  #) ***   #) ***   {Are you ordering a CV Procedure (e.g. stress test, cath, DCCV, TEE, etc)?   Press F2        :[id number]}   Current medicines are reviewed at length with the patient today.   The patient {ACTIONS; HAS/DOES NOT HAVE:19233} concerns regarding her medicines.  The following changes were made today:  {NONE DEFAULTED:18576}  Labs/ tests ordered today include: *** Orders Placed This Encounter  Procedures   EKG 12-Lead     Disposition: Follow up with {EPMDS:28135} or EP APP {EPFOLLOW UP:28173}   Signed, Sherie Don, NP  08/29/22  2:50 PM  Electrophysiology [facility]

## 2022-08-29 NOTE — Progress Notes (Signed)
Please inform the patient: The ZIO monitor showed frequent SVTs.  SVT is a condition when the heart suddenly starts beating much faster than normal. The urgent referral to cardiology has been placed for evaluation.

## 2022-08-29 NOTE — Telephone Encounter (Signed)
Left voicemail message for patient to call back for review of results and recommendations.

## 2022-08-29 NOTE — Progress Notes (Signed)
Cardiology Office Note Date:  08/29/2022  Patient ID:  Heather Cain, Heather Cain [birth year], MRN [id number] PCP:  Heather Dies, NP  Cardiologist:  None Electrophysiologist: None    Chief Complaint: new Afib, palpitations  History of Present Illness: [name] is a 52 y.o. female with PMH notable for OSA, T2DM, HLD, anxiety, lower leg edema, s/p bilat mastectomy; seen today acute visit due to new afib, SVT detected on ambulatory monitor.   She presented [facility] ER 7/20 with lightheadedness, dizzy, mild headache, palpitations. PACs noted on tele during ER. On follow-up PCP ordered 2 wk zio. Her monitor shows new AF and she is seen today for urgent eval.   She has continued to have intermittent palpitations and dizziness. Denies chest pain, chest pressure. No syncope or presyncope. She diligently uses CPAP nightly. Has chronic lower extremity edema - takes lasix MWF with robust UOP. She has a BP cuff at home, but does not use regularly. Has good appetite.    AAD History: none  Past Medical History:  Diagnosis Date   DCIS (ductal carcinoma in situ) of breast    Diabetes mellitus without complication (HCC)    Family history of bladder cancer    Family history of uterine cancer    High cholesterol    Neuromuscular disorder (HCC)    peripheral neuropathy   PONV (postoperative nausea and vomiting)    Vaginal Pap smear, abnormal     Past Surgical History:  Procedure Laterality Date   BREAST BIOPSY Right 03/08/2021   right breast bx coil clip -positive   BREAST BIOPSY Right 03/24/2021   stereo biopsy x clip/DUCTAL CARCINOMA IN SITU (DCIS),   BREAST BIOPSY Left 05/18/2021   ribbon clip   BREAST BIOPSY Right 05/18/2021   coil clip   COLONOSCOPY WITH PROPOFOL N/A 03/08/2022   Procedure: COLONOSCOPY WITH PROPOFOL;  Surgeon: Heather Mood, MD;  Location: [facility] END[facility];  Service: Gastroenterology;  Laterality: N/A;  REQUESTED LAST CASE   CRYOTHERAPY     EVACUATION BREAST HEMATOMA  Right 06/16/2021   Procedure: EVACUATION HEMATOMA BREAST;  Surgeon: Heather Lerner, MD;  Location: [facility] ORS;  Service: General;  Laterality: Right;   MASTECTOMY     SIMPLE MASTECTOMY WITH AXILLARY SENTINEL NODE BIOPSY Right 06/15/2021   Procedure: SIMPLE MASTECTOMY WITH AXILLARY SENTINEL NODE BIOPSY, Total;  Surgeon: Heather Lerner, MD;  Location: [facility] OR[facility];  Service: General;  Laterality: Right;   TOTAL MASTECTOMY Left 06/15/2021   Procedure: TOTAL MASTECTOMY, prophylactic left;  Surgeon: Heather Lerner, MD;  Location: [facility] ORS;  Service: General;  Laterality: Left;   WRIST SURGERY      Current Outpatient Medications  Medication Instructions   Accu-Chek Softclix Lancets lancets Use as instructed   albuterol (VENTOLIN HFA) 108 (90 Base) MCG/ACT inhaler 2 puffs, Inhalation, Every 6 hours PRN   fluticasone (FLONASE) 50 MCG/ACT nasal spray Use 2 spray(s) in each nostril once daily   furosemide (LASIX) 20 mg, Oral, Daily   gabapentin (NEURONTIN) 100 MG capsule TAKE 1 CAPSULE BY MOUTH TWICE DAILY FOR 7 DAYS THEN INCREASE TO 2 CAPS TWICE DAILY AND CONTINUE   glimepiride (AMARYL) 4 mg, Oral, Daily before breakfast   glucose blood (ACCU-CHEK GUIDE) test strip Use as directed   hydrocortisone 2.5 % cream apply to face once a day Tues, Thurs, Sat   ketoconazole (NIZORAL) 2 % shampoo apply three times per week, massage into scalp and leave in for 10 minutes before rinsing out   loratadine (CLARITIN) 10 mg, Oral,  Daily   mometasone (ELOCON) 0.1 % cream apply to legs 5 nights a week   rosuvastatin (CRESTOR) 20 mg, Oral, Daily   Semaglutide (1 MG/DOSE) 1 mg, Injection, Weekly    Social History:  The patient  reports that she quit smoking about 15 months ago. Her smoking use included cigarettes. She has never used smokeless tobacco. She reports current alcohol use. She reports current drug use. Drug: Marijuana.   Family History:  The patient's family history includes Bladder Cancer (age of onset:  11) in her father; Breast cancer in her maternal great-grandmother; Cancer - Other in her maternal grandfather; Skin cancer in her maternal aunt; Uterine cancer (age of onset: 37) in her mother.  ROS:  Please see the history of present illness. All other systems are reviewed and otherwise negative.   PHYSICAL EXAM:  VS:  BP 110/72 (BP Location: Left Arm, Patient Position: Sitting, Cuff Size: Large)   Pulse 86   Ht 5\' 3"  (1.6 m)   Wt (!) 324 lb 6.4 oz (147.1 kg)   SpO2 95%   BMI 57.46 kg/m  BMI: Body mass index is 57.46 kg/m.  GEN- The patient is well appearing, alert and oriented x 3 today.   Lungs- Clear to ausculation bilaterally, normal work of breathing.  Heart- Regular rate and rhythm, no murmurs, rubs or gallops Extremities- 1+ peripheral edema, warm, dry  EKG is ordered. Personal review of EKG from today shows:     EKG Interpretation Date/Time:  Tuesday August 29 2022 14:48:15 EDT Ventricular Rate:  86 PR Interval:  174 QRS Duration:  82 QT Interval:  380 QTC Calculation: 454 R Axis:   -32  Text Interpretation: Normal sinus rhythm Left axis deviation Low voltage QRS Confirmed by Heather Cain [phone]) on 08/29/2022 2:55:06 PM    Recent Labs: 07/28/2022: BUN 16; Creatinine, Ser 0.66; Hemoglobin 13.4; Platelets 233; Potassium 3.7; Sodium 136 07/31/2022: TSH 0.06  No results found for requested labs within last 365 days.   CrCl cannot be calculated (Patient's most recent lab result is older than the maximum 21 days allowed.).   Wt Readings from Last 3 Encounters:  08/29/22 (!) 324 lb 6.4 oz (147.1 kg)  08/01/22 (!) 319 lb (144.7 kg)  07/28/22 (!) 314 lb (142.4 kg)     Additional studies reviewed include: Previous EP, cardiology notes.   Long term monitor, 08/27/2022 HR 61 - 250 bpm, average 92 bpm. 506 SVT episodes, longest 13.7 seconds with an average HR 145 bpm. <1% burden of AF, longest episode 44 min 14 sec with an average rate 102 bpm. Frequent  supraventricular ectopy, 10.6%. Rare ventricular ectopy.   Urgent cardiology referral placed for new AF.   ASSESSMENT AND PLAN:  #) parox AFib #) SVT #) PAC Newly diagnosed AFib by recent zio monitor Patient's main symptoms are palpitation and dizziness Start 25mg  toprol nightly Rec to check BP/pulse 2-3 times / week and record measurements TTE to eval Rec to continue T2DM mgmt with PCP  #) Hypercoag d/t parox afib CHA2DS2-VASc Score = 2 [CHF History: 0, HTN History: 0, Diabetes History: 1, Stroke History: 0, Vascular Disease History: 0, Age Score: 0, Gender Score: 1].  Therefore, the patient's annual risk of stroke is 2.2 %.    Stroke ppx - start 5mg  eliquis BID Samples provided from office and Rx to preferred pharmacy, requested patient notify office if cost prohibitive  #) OSA Encouraged nightly CPAP usage  #) lower extremity edema Continue 20mg  lasix MWF Consider reducing  fluid intake Monitor sodium in diet Echo to further eval  #) T2DM Last A1c >7 Intolerant of metformin in the past Continue glimeride and ozempic per PCP Consider jardiance    Current medicines are reviewed at length with the patient today.   The patient has concerns regarding her medicines.  The following changes were made today:   START 5mg  eliquis BID START 25mg  toprol daily  Labs/ tests ordered today include:  Orders Placed This Encounter  Procedures   EKG 12-Lead   ECHOCARDIOGRAM COMPLETE     Disposition: Follow up with EP APP in 4 weeks   Signed, Heather Don, NP  08/29/22  3:13 PM  Electrophysiology [facility]

## 2022-08-30 DIAGNOSIS — G4733 Obstructive sleep apnea (adult) (pediatric): Secondary | ICD-10-CM | POA: Diagnosis not present

## 2022-09-12 ENCOUNTER — Other Ambulatory Visit: Payer: Self-pay | Admitting: Nurse Practitioner

## 2022-09-12 ENCOUNTER — Other Ambulatory Visit: Payer: Self-pay | Admitting: Cardiology

## 2022-09-12 DIAGNOSIS — I480 Paroxysmal atrial fibrillation: Secondary | ICD-10-CM

## 2022-09-12 DIAGNOSIS — G4733 Obstructive sleep apnea (adult) (pediatric): Secondary | ICD-10-CM

## 2022-09-12 DIAGNOSIS — I471 Supraventricular tachycardia, unspecified: Secondary | ICD-10-CM

## 2022-09-12 DIAGNOSIS — E119 Type 2 diabetes mellitus without complications: Secondary | ICD-10-CM

## 2022-09-12 DIAGNOSIS — R600 Localized edema: Secondary | ICD-10-CM

## 2022-09-12 DIAGNOSIS — R002 Palpitations: Secondary | ICD-10-CM

## 2022-09-13 DIAGNOSIS — F4310 Post-traumatic stress disorder, unspecified: Secondary | ICD-10-CM | POA: Diagnosis not present

## 2022-09-20 ENCOUNTER — Ambulatory Visit: Payer: Medicaid Other | Attending: Cardiology

## 2022-09-20 DIAGNOSIS — I480 Paroxysmal atrial fibrillation: Secondary | ICD-10-CM | POA: Diagnosis not present

## 2022-09-20 DIAGNOSIS — R600 Localized edema: Secondary | ICD-10-CM | POA: Diagnosis not present

## 2022-09-20 DIAGNOSIS — R002 Palpitations: Secondary | ICD-10-CM | POA: Diagnosis not present

## 2022-09-20 LAB — ECHOCARDIOGRAM COMPLETE: Area-P 1/2: 3.85 cm2

## 2022-09-27 DIAGNOSIS — F4310 Post-traumatic stress disorder, unspecified: Secondary | ICD-10-CM | POA: Diagnosis not present

## 2022-09-29 ENCOUNTER — Ambulatory Visit: Payer: Medicaid Other | Attending: Cardiology | Admitting: Cardiology

## 2022-09-29 ENCOUNTER — Encounter: Payer: Self-pay | Admitting: Cardiology

## 2022-09-29 VITALS — BP 110/70 | HR 90 | Ht 63.0 in | Wt 323.5 lb

## 2022-09-29 DIAGNOSIS — I480 Paroxysmal atrial fibrillation: Secondary | ICD-10-CM | POA: Diagnosis not present

## 2022-09-29 DIAGNOSIS — D6869 Other thrombophilia: Secondary | ICD-10-CM | POA: Diagnosis not present

## 2022-09-29 DIAGNOSIS — G4733 Obstructive sleep apnea (adult) (pediatric): Secondary | ICD-10-CM

## 2022-09-29 DIAGNOSIS — I471 Supraventricular tachycardia, unspecified: Secondary | ICD-10-CM

## 2022-09-29 LAB — CBC
Hematocrit: 42.4 % (ref 34.0–46.6)
Hemoglobin: 13.6 g/dL (ref 11.1–15.9)
MCH: 28.6 pg (ref 26.6–33.0)
MCHC: 32.1 g/dL (ref 31.5–35.7)
MCV: 89 fL (ref 79–97)
Platelets: 201 10*3/uL (ref 150–450)
RBC: 4.76 x10E6/uL (ref 3.77–5.28)
RDW: 14.3 % (ref 11.7–15.4)
WBC: 5.9 10*3/uL (ref 3.4–10.8)

## 2022-09-29 MED ORDER — METOPROLOL SUCCINATE ER 25 MG PO TB24
25.0000 mg | ORAL_TABLET | Freq: Two times a day (BID) | ORAL | 0 refills | Status: AC
Start: 2022-09-29 — End: ?

## 2022-09-29 NOTE — Patient Instructions (Addendum)
Medication Instructions:   Your physician has recommended you make the following change in your medication:  INCREASE Metoprolol Succinate 25 mg tablet by mouth twice daily.  *If you need a refill on your cardiac medications before your next appointment, please call your pharmacy*   Lab Work:  Your provider would like for you to have the following labs drawn: TODAY.  CBC/TSH/T4/MAGNESIUM/BMET   Please go to [facility] [address] ([facility]) [address], [place] [address] You do not need an appointment.  They are open from 7:30 am-4 pm.  Lunch from 1:00 pm- 2:00 pM          Testing/Procedures:  NONE   Follow-Up: At [facility], you and your health needs are our priority.  As part of our continuing mission to provide you with exceptional heart care, we have created designated Provider Care Teams.  These Care Teams include your primary Cardiologist (physician) and Advanced Practice Providers (APPs -  Physician Assistants and Nurse Practitioners) who all work together to provide you with the care you need, when you need it.  We recommend signing up for the patient portal called "MyChart".  Sign up information is provided on this After Visit Summary.  MyChart is used to connect with patients for Virtual Visits (Telemedicine).  Patients are able to view lab/test results, encounter notes, upcoming appointments, etc.  Non-urgent messages can be sent to your provider as well.   To learn more about what you can do with MyChart, go to [email or web address].    Your next appointment:   2 month(s)  Provider:   Sherie Don, NP    OTHER Instructions:  *Purchase/Start OTC Magnesium by mouth once daily. You can find this supplement at your local pharmacy.

## 2022-09-29 NOTE — Progress Notes (Signed)
Cardiology Office Note Date:  09/29/2022  Patient ID:  Heather Cain, Heather Cain [birth year], MRN [id number] PCP:  Kara Dies, NP  Cardiologist:  None Electrophysiologist: None    Chief Complaint: Afib, palpitations  History of Present Illness: Heather Cain is a 52 y.o. female with PMH notable for parox AFib, SVT, OSA, T2DM, HLD, anxiety, lower leg edema, s/p bilat mastectomy; seen today routine EP follow-up.    She presented to [facility] ER 7/20 with lightheadedness, dizzy, mild headache, palpitations. PACs noted on tele during ER. On follow-up PCP ordered 2 wk zio. Her monitor shows new AF, and 10% SVT burden. I saw her quickly thereafter and initiated eliquis for chads-vasc of 2. Toprol started. Echo showed nl LVEF, normal LA, mild MR.   On follow-up today, she continues to have frequent palpitations. Feels a fluttering in chest and then feels lightheaded. No syncope. She has also noticed a decrease exercise tolerance - seems to get tired quicker and need to take more frequent breaks. The reduced exercise tolerance pre-dates starting metoprolol.  Tolerating eliquis well, no bleeding concerns. She brings a BP and pulse log. BPs systolics 90-110s, very rarely 80s. Pulses remain elevated at times 70-110s.  Continues to use CPAP nightly.  Denies chest pain, chest pressure, SOB. Stable lower extremity edema.   AAD History: none  Past Medical History:  Diagnosis Date   DCIS (ductal carcinoma in situ) of breast    Diabetes mellitus without complication (HCC)    Family history of bladder cancer    Family history of uterine cancer    High cholesterol    Neuromuscular disorder (HCC)    peripheral neuropathy   PONV (postoperative nausea and vomiting)    Vaginal Pap smear, abnormal     Past Surgical History:  Procedure Laterality Date   BREAST BIOPSY Right 03/08/2021   right breast bx coil clip -positive   BREAST BIOPSY Right 03/24/2021   stereo biopsy x clip/DUCTAL CARCINOMA IN SITU  (DCIS),   BREAST BIOPSY Left 05/18/2021   ribbon clip   BREAST BIOPSY Right 05/18/2021   coil clip   COLONOSCOPY WITH PROPOFOL N/A 03/08/2022   Procedure: COLONOSCOPY WITH PROPOFOL;  Surgeon: Wyline Mood, MD;  Location: [facility] ENDOSCOPY;  Service: Gastroenterology;  Laterality: N/A;  REQUESTED LAST CASE   CRYOTHERAPY     EVACUATION BREAST HEMATOMA Right 06/16/2021   Procedure: EVACUATION HEMATOMA BREAST;  Surgeon: Campbell Lerner, MD;  Location: [facility] ORS;  Service: General;  Laterality: Right;   MASTECTOMY     SIMPLE MASTECTOMY WITH AXILLARY SENTINEL NODE BIOPSY Right 06/15/2021   Procedure: SIMPLE MASTECTOMY WITH AXILLARY SENTINEL NODE BIOPSY, Total;  Surgeon: Campbell Lerner, MD;  Location: [facility] ORS;  Service: General;  Laterality: Right;   TOTAL MASTECTOMY Left 06/15/2021   Procedure: TOTAL MASTECTOMY, prophylactic left;  Surgeon: Campbell Lerner, MD;  Location: [facility] ORS;  Service: General;  Laterality: Left;   WRIST SURGERY      Current Outpatient Medications  Medication Instructions   Accu-Chek Softclix Lancets lancets Use as instructed   albuterol (VENTOLIN HFA) 108 (90 Base) MCG/ACT inhaler 2 puffs, Inhalation, Every 6 hours PRN   apixaban (ELIQUIS) 5 mg, Oral, 2 times daily   fluticasone (FLONASE) 50 MCG/ACT nasal spray Use 2 spray(s) in each nostril once daily   furosemide (LASIX) 20 mg, Oral, Daily   gabapentin (NEURONTIN) 100 MG capsule TAKE 1 CAPSULE BY MOUTH TWICE DAILY FOR 7 DAYS THEN INCREASE TO 2 CAPS TWICE DAILY AND CONTINUE   glimepiride (AMARYL)  4 mg, Oral, Daily before breakfast   glucose blood (ACCU-CHEK GUIDE) test strip Use as directed   hydrocortisone 2.5 % cream apply to face once a day Tues, Thurs, Sat   ketoconazole (NIZORAL) 2 % shampoo apply three times per week, massage into scalp and leave in for 10 minutes before rinsing out   loratadine (CLARITIN) 10 mg, Oral, Daily   metoprolol succinate (TOPROL XL) 25 mg, Oral, Daily at bedtime   mometasone (ELOCON)  0.1 % cream apply to legs 5 nights a week   rosuvastatin (CRESTOR) 20 mg, Oral, Daily   Semaglutide (1 MG/DOSE) 1 mg, Injection, Weekly    Social History:  The patient  reports that she quit smoking about 16 months ago. Her smoking use included cigarettes. She has never used smokeless tobacco. She reports current alcohol use. She reports current drug use. Drug: Marijuana.   Family History:  The patient's family history includes Bladder Cancer (age of onset: 83) in her father; Breast cancer in her maternal great-grandmother; Cancer - Other in her maternal grandfather; Skin cancer in her maternal aunt; Uterine cancer (age of onset: 24) in her mother.  ROS:  Please see the history of present illness. All other systems are reviewed and otherwise negative.   PHYSICAL EXAM:  VS:  BP 110/70 (BP Location: Left Arm, Patient Position: Sitting, Cuff Size: Large)   Pulse 90   Ht 5\' 3"  (1.6 m)   Wt (!) 323 lb 8 oz (146.7 kg)   SpO2 97%   BMI 57.31 kg/m  BMI: Body mass index is 57.31 kg/m.  GEN- The patient is well appearing, alert and oriented x 3 today.   Lungs- Clear to ausculation bilaterally, normal work of breathing.  Heart- Regular rate and rhythm, no murmurs, rubs or gallops Extremities- 1+ peripheral edema, warm, dry  EKG is ordered. Personal review of EKG from today shows:    EKG Interpretation Date/Time:  Friday September 29 2022 14:37:26 EDT Ventricular Rate:  90 PR Interval:  178 QRS Duration:  84 QT Interval:  378 QTC Calculation: 462 R Axis:   -37  Text Interpretation: Normal sinus rhythm Left axis deviation Low voltage QRS Confirmed by Sherie Don [phone]) on 09/29/2022 2:41:56 PM      Recent Labs: 07/28/2022: BUN 16; Creatinine, Ser 0.66; Hemoglobin 13.4; Platelets 233; Potassium 3.7; Sodium 136 07/31/2022: TSH 0.06  No results found for requested labs within last 365 days.   CrCl cannot be calculated (Patient's most recent lab result is older than the maximum 21 days  allowed.).   Wt Readings from Last 3 Encounters:  09/29/22 (!) 323 lb 8 oz (146.7 kg)  08/29/22 (!) 324 lb 6.4 oz (147.1 kg)  08/01/22 (!) 319 lb (144.7 kg)     Additional studies reviewed include: Previous EP, cardiology notes.   TTE, 09/20/2022  1. Left ventricular ejection fraction, by estimation, is 60 to 65%. The left ventricle has normal function. The left ventricle has no regional wall motion abnormalities. Left ventricular diastolic parameters were normal. The average left ventricular global longitudinal strain is -23.3 %.   2. Right ventricular systolic function is normal. The right ventricular size is normal. Tricuspid regurgitation signal is inadequate for assessing PA pressure.   3. The mitral valve is normal in structure. Mild mitral valve regurgitation. No evidence of mitral stenosis.   4. The aortic valve has an indeterminant number of cusps. Aortic valve regurgitation is not visualized. No aortic stenosis is present.   5. The inferior vena  cava is normal in size with greater than 50% respiratory variability, suggesting right atrial pressure of 3 mmHg.   Long term monitor, 08/27/2022 HR 61 - 250 bpm, average 92 bpm. 506 SVT episodes, longest 13.7 seconds with an average HR 145 bpm. <1% burden of AF, longest episode 44 min 14 sec with an average rate 102 bpm. Frequent supraventricular ectopy, 10.6%. Rare ventricular ectopy.  Urgent cardiology referral placed for new AF.   ASSESSMENT AND PLAN:  #) parox AFib #) SVT #) PAC #) palpitations Continues to have palpitations and lightheadedness Recent echo with preserved LVEF and normal LA size Increase toprol to 25mg  BID Continue to check BP, pulse a few times a week.  Start OTC mag supplement Update BMP, mag, thyroid labs today  #) Hypercoag d/t parox afib CHA2DS2-VASc Score = 2 [CHF History: 0, HTN History: 0, Diabetes History: 1, Stroke History: 0, Vascular Disease History: 0, Age Score: 0, Gender Score: 1].   Therefore, the patient's annual risk of stroke is 2.2 %.    Stroke ppx - start 5mg  eliquis BID No bleeding concerns Update CBC  #) OSA Encouraged nightly CPAP usage    Current medicines are reviewed at length with the patient today.   The patient has concerns regarding her medicines.  The following changes were made today:   Increase 25mg  toprol BID  Labs/ tests ordered today include:  Orders Placed This Encounter  Procedures   Basic Metabolic Panel (BMET)   TSH   T4   Magnesium   CBC   EKG 12-Lead     Disposition: Follow up with EP APP  2-3 months    Signed, Sherie Don, NP  09/29/22  2:42 PM  Electrophysiology [facility]

## 2022-09-30 LAB — BASIC METABOLIC PANEL
BUN/Creatinine Ratio: 17 (ref 9–23)
BUN: 12 mg/dL (ref 6–24)
CO2: 22 mmol/L (ref 20–29)
Calcium: 9.2 mg/dL (ref 8.7–10.2)
Chloride: 102 mmol/L (ref 96–106)
Creatinine, Ser: 0.72 mg/dL (ref 0.57–1.00)
Glucose: 162 mg/dL — ABNORMAL HIGH (ref 70–99)
Potassium: 4 mmol/L (ref 3.5–5.2)
Sodium: 140 mmol/L (ref 134–144)
eGFR: 101 mL/min/{1.73_m2} (ref >59)

## 2022-09-30 LAB — MAGNESIUM: Magnesium: 2.1 mg/dL (ref 1.6–2.3)

## 2022-09-30 LAB — T4: T4, Total: 6.8 ug/dL (ref 4.5–12.0)

## 2022-09-30 LAB — TSH: TSH: 0.866 u[IU]/mL (ref 0.450–4.500)

## 2022-10-03 DIAGNOSIS — R200 Anesthesia of skin: Secondary | ICD-10-CM | POA: Diagnosis not present

## 2022-10-03 DIAGNOSIS — G629 Polyneuropathy, unspecified: Secondary | ICD-10-CM | POA: Diagnosis not present

## 2022-10-03 DIAGNOSIS — M79641 Pain in right hand: Secondary | ICD-10-CM | POA: Diagnosis not present

## 2022-10-03 DIAGNOSIS — R202 Paresthesia of skin: Secondary | ICD-10-CM | POA: Diagnosis not present

## 2022-10-03 DIAGNOSIS — M25542 Pain in joints of left hand: Secondary | ICD-10-CM | POA: Diagnosis not present

## 2022-10-03 DIAGNOSIS — M25541 Pain in joints of right hand: Secondary | ICD-10-CM | POA: Diagnosis not present

## 2022-10-03 DIAGNOSIS — M65321 Trigger finger, right index finger: Secondary | ICD-10-CM | POA: Diagnosis not present

## 2022-10-03 DIAGNOSIS — M79642 Pain in left hand: Secondary | ICD-10-CM | POA: Diagnosis not present

## 2022-10-05 ENCOUNTER — Other Ambulatory Visit: Payer: Self-pay | Admitting: Neurology

## 2022-10-05 ENCOUNTER — Other Ambulatory Visit: Payer: Self-pay | Admitting: Nurse Practitioner

## 2022-10-05 DIAGNOSIS — M7989 Other specified soft tissue disorders: Secondary | ICD-10-CM

## 2022-10-05 DIAGNOSIS — D1720 Benign lipomatous neoplasm of skin and subcutaneous tissue of unspecified limb: Secondary | ICD-10-CM

## 2022-10-11 DIAGNOSIS — F4310 Post-traumatic stress disorder, unspecified: Secondary | ICD-10-CM | POA: Diagnosis not present

## 2022-10-12 ENCOUNTER — Ambulatory Visit
Admission: RE | Admit: 2022-10-12 | Discharge: 2022-10-12 | Disposition: A | Discharge status: Home / Self Care | Payer: Medicaid Other | Source: Ambulatory Visit | Attending: Neurology | Admitting: Neurology

## 2022-10-12 ENCOUNTER — Other Ambulatory Visit: Payer: Self-pay | Admitting: Neurology

## 2022-10-12 ENCOUNTER — Other Ambulatory Visit (HOSPITAL_COMMUNITY): Payer: Self-pay

## 2022-10-12 ENCOUNTER — Telehealth: Payer: Self-pay | Admitting: Cardiology

## 2022-10-12 DIAGNOSIS — M7989 Other specified soft tissue disorders: Secondary | ICD-10-CM | POA: Diagnosis not present

## 2022-10-12 DIAGNOSIS — D1720 Benign lipomatous neoplasm of skin and subcutaneous tissue of unspecified limb: Secondary | ICD-10-CM | POA: Diagnosis not present

## 2022-10-12 DIAGNOSIS — I480 Paroxysmal atrial fibrillation: Secondary | ICD-10-CM

## 2022-10-12 DIAGNOSIS — R2232 Localized swelling, mass and lump, left upper limb: Secondary | ICD-10-CM | POA: Diagnosis not present

## 2022-10-12 MED ORDER — METOPROLOL SUCCINATE ER 25 MG PO TB24
25.0000 mg | ORAL_TABLET | Freq: Every day | ORAL | 0 refills | Status: DC
  Filled 2022-10-12: qty 90, 90d supply, fill #0

## 2022-10-12 NOTE — Telephone Encounter (Signed)
Sherie Don, NP  You1 minute ago (11:52 AM)    Please reduce toprol to 25mg  daily - her previous dose.

## 2022-10-12 NOTE — Telephone Encounter (Signed)
Spoke w/ pt. She reports that at ov on 09/29/22, she was advised to  INCREASE Metoprolol Succinate 25 mg tablet by mouth twice daily.  Since that time, she has been feeling tired and "constantly exhausted all the time"  She reports SOB while sitting and cannot wear her CPAP at night b/c she feels that she cannot catch her breath. She checks her BP every other day and reports yesterday it was 109/63 P 78, on Mon 9/30 it was 89/55 P 74. Asked her how she felt when BP was that low and she said that it feels no different than her now constant since increasing metoprolol. She read that this is a side effect and states that she cannot live like this. Advised her that I will make Suzann aware of her concerns and call her back w/ her recommendation.

## 2022-10-12 NOTE — Telephone Encounter (Signed)
Pt c/o medication issue:  1. Name of Medication: metoprolol succinate (TOPROL XL) 25 MG 24 hr tablet   2. How are you currently taking this medication (dosage and times per day)? Take 1 tablet (25 mg total) by mouth in the morning and at bedtime.   3. Are you having a reaction (difficulty breathing--STAT)? no  4. What is your medication issue? Patient states that she is having labor breathing, wasn't able to wear her cpap machine at night. Calling to see if dosage can be change back. Please advise

## 2022-10-12 NOTE — Telephone Encounter (Signed)
Spoke w/ pt.  Advised her of Suzann's recommendation. She verbalizes understanding and is agreeable. Asked her to continue to monitor sx, BP and HR and call back if she does not see improvement. She is appreciative of the call.

## 2022-10-16 ENCOUNTER — Ambulatory Visit: Payer: Medicaid Other | Admitting: Cardiology

## 2022-10-17 ENCOUNTER — Other Ambulatory Visit: Payer: Self-pay | Admitting: Nurse Practitioner

## 2022-10-17 DIAGNOSIS — G4733 Obstructive sleep apnea (adult) (pediatric): Secondary | ICD-10-CM

## 2022-10-19 DIAGNOSIS — G4733 Obstructive sleep apnea (adult) (pediatric): Secondary | ICD-10-CM | POA: Diagnosis not present

## 2022-10-25 DIAGNOSIS — F4310 Post-traumatic stress disorder, unspecified: Secondary | ICD-10-CM | POA: Diagnosis not present

## 2022-10-30 DIAGNOSIS — R200 Anesthesia of skin: Secondary | ICD-10-CM | POA: Diagnosis not present

## 2022-11-04 ENCOUNTER — Other Ambulatory Visit: Payer: Self-pay | Admitting: Nurse Practitioner

## 2022-11-06 DIAGNOSIS — F4310 Post-traumatic stress disorder, unspecified: Secondary | ICD-10-CM | POA: Diagnosis not present

## 2022-11-14 DIAGNOSIS — M65321 Trigger finger, right index finger: Secondary | ICD-10-CM | POA: Diagnosis not present

## 2022-11-14 DIAGNOSIS — G5603 Carpal tunnel syndrome, bilateral upper limbs: Secondary | ICD-10-CM | POA: Diagnosis not present

## 2022-11-15 ENCOUNTER — Telehealth: Payer: Self-pay

## 2022-11-15 ENCOUNTER — Encounter: Payer: Self-pay | Admitting: Emergency Medicine

## 2022-11-15 ENCOUNTER — Other Ambulatory Visit: Payer: Self-pay

## 2022-11-15 ENCOUNTER — Emergency Department
Admission: EM | Admit: 2022-11-15 | Discharge: 2022-11-15 | Disposition: A | Discharge status: Home / Self Care | Payer: Medicaid Other | Attending: Emergency Medicine | Admitting: Emergency Medicine

## 2022-11-15 DIAGNOSIS — Z7901 Long term (current) use of anticoagulants: Secondary | ICD-10-CM | POA: Insufficient documentation

## 2022-11-15 DIAGNOSIS — Z794 Long term (current) use of insulin: Secondary | ICD-10-CM | POA: Insufficient documentation

## 2022-11-15 DIAGNOSIS — E1165 Type 2 diabetes mellitus with hyperglycemia: Secondary | ICD-10-CM | POA: Diagnosis not present

## 2022-11-15 DIAGNOSIS — R739 Hyperglycemia, unspecified: Secondary | ICD-10-CM

## 2022-11-15 DIAGNOSIS — D72829 Elevated white blood cell count, unspecified: Secondary | ICD-10-CM | POA: Insufficient documentation

## 2022-11-15 DIAGNOSIS — R42 Dizziness and giddiness: Secondary | ICD-10-CM | POA: Diagnosis not present

## 2022-11-15 LAB — URINALYSIS, ROUTINE W REFLEX MICROSCOPIC
Bacteria, UA: NONE SEEN
Bilirubin Urine: NEGATIVE
Glucose, UA: >=500 mg/dL — AB
Hgb urine dipstick: NEGATIVE
Ketones, ur: NEGATIVE mg/dL
Leukocytes,Ua: NEGATIVE
Nitrite: NEGATIVE
Protein, ur: NEGATIVE mg/dL
Specific Gravity, Urine: 1.02 (ref 1.005–1.030)
pH: 6 (ref 5.0–8.0)

## 2022-11-15 LAB — CBG MONITORING, ED
Glucose-Capillary: 342 mg/dL — ABNORMAL HIGH (ref 70–99)
Glucose-Capillary: 308 mg/dL — ABNORMAL HIGH (ref 70–99)

## 2022-11-15 LAB — CBC
HCT: 44.3 % (ref 36.0–46.0)
Hemoglobin: 14.5 g/dL (ref 12.0–15.0)
MCH: 29.5 pg (ref 26.0–34.0)
MCHC: 32.7 g/dL (ref 30.0–36.0)
MCV: 90.2 fL (ref 80.0–100.0)
Platelets: 219 10*3/uL (ref 150–400)
RBC: 4.91 MIL/uL (ref 3.87–5.11)
RDW: 15 % (ref 11.5–15.5)
WBC: 16 10*3/uL — ABNORMAL HIGH (ref 4.0–10.5)
nRBC: 0 % (ref 0.0–0.2)

## 2022-11-15 LAB — BASIC METABOLIC PANEL
Anion gap: 11 (ref 5–15)
BUN: 20 mg/dL (ref 6–20)
CO2: 20 mmol/L — ABNORMAL LOW (ref 22–32)
Calcium: 9.3 mg/dL (ref 8.9–10.3)
Chloride: 103 mmol/L (ref 98–111)
Creatinine, Ser: 0.74 mg/dL (ref 0.44–1.00)
GFR, Estimated: >60 mL/min (ref >60)
Glucose, Bld: 365 mg/dL — ABNORMAL HIGH (ref 70–99)
Potassium: 4.2 mmol/L (ref 3.5–5.1)
Sodium: 134 mmol/L — ABNORMAL LOW (ref 135–145)

## 2022-11-15 LAB — TROPONIN I (HIGH SENSITIVITY): Troponin I (High Sensitivity): 5 ng/L (ref <18)

## 2022-11-15 MED ORDER — SODIUM CHLORIDE 0.9 % IV BOLUS
1000.0000 mL | Freq: Once | INTRAVENOUS | Status: AC
  Administered 2022-11-15: 1000 mL via INTRAVENOUS

## 2022-11-15 MED ORDER — ONDANSETRON HCL 4 MG/2ML IJ SOLN
4.0000 mg | Freq: Once | INTRAMUSCULAR | Status: AC
  Administered 2022-11-15: 4 mg via INTRAVENOUS
  Filled 2022-11-15: qty 2

## 2022-11-15 MED ORDER — INSULIN ASPART 100 UNIT/ML IJ SOLN
5.0000 [IU] | Freq: Once | INTRAMUSCULAR | Status: AC
  Administered 2022-11-15: 5 [IU] via INTRAVENOUS
  Filled 2022-11-15: qty 1

## 2022-11-15 MED ORDER — ACETAMINOPHEN 500 MG PO TABS
1000.0000 mg | ORAL_TABLET | Freq: Once | ORAL | Status: AC
  Administered 2022-11-15: 1000 mg via ORAL
  Filled 2022-11-15: qty 2

## 2022-11-15 NOTE — Discharge Instructions (Signed)
We discussed that your elevated sugar is most likely secondary to your steroid shots.  Your sugars have come down slightly with the insulin fluids but they are still elevated.  We discussed further treatment of this in the ER but this would all be temporary and will still just take time for it to come down.  There were no signs of any complications.  You can return to the ER if develop worsening symptoms fevers or any other concerns but at this time I suspect that this will get better over the next couple days.

## 2022-11-15 NOTE — Telephone Encounter (Signed)
Patient states she went to a hand specialist yesterday and they gave her a steroid shot in her right finger, one in her right wrist, and one in her left wrist.  Patient states she just got off the phone with her hand specialist's office and they suggested she call us because she is having a reaction to the steroid shots.  Patient states she is diabetic and her sugar reading was 399 when she woke up this morning.  Patient states she waited five minutes and then checked it again and it was the same reading.  Patient states she checked it again about half an hour ago and it was 367, which was after she had some water and some coffee with sugar-free creamer.  Patient states she thought she needed to eat something, so she had a meal replacement protein shake within the last twenty minutes.  Patient states she feels a little dizzy and clammy.  Patient states she is having a feeling across her shoulders and back like it would feel if she had a wet towel across that area.  I transferred call to Access Nurse.

## 2022-11-15 NOTE — ED Notes (Signed)
See triage notes. Patient stated she was feeling "weird" and dizzy. Patient had three steroid injections yesterday in her hand. Patient's BGL has been elevated today.

## 2022-11-15 NOTE — ED Notes (Signed)
Report received by Paramedic Ginny.

## 2022-11-15 NOTE — ED Triage Notes (Signed)
Pt states she was feeling "weird" and dizzy today. Had 3 steroid injections yesterday in hand and today when she checked glucose multiple times and it was 400. Pt states she called PCP and was told to come to ER to be evaluated. Pt denies any pain.

## 2022-11-15 NOTE — ED Provider Notes (Signed)
[facility] Provider Note    Event Date/Time   First MD Initiated Contact with Patient 11/15/22 1705     (approximate)   History   Hyperglycemia   HPI  Heather Cain is a 52 y.o. female  With diabetes who comes in for feeling dizzy and off today.  She reports having 3 steroid injections yesterday in her hand and when she checked her glucose it was in the 400s and her PCP told her to come into the ER to be evaluated.  Patient reports that she is not typically on insulin.  She states that her sugars are not been this high before.  She reports that her symptoms started around 930 this morning.  She states that she is really only drink some water and coffee given she was worried her sugars were going to go up even higher.  She reports having a friend who went into a diabetic coma and she was worried the fact was going to happen to her which was why she presented today.  She reports that she is on Eliquis and on diuretics but states that her fluid status is good at this time denies any shortness of breath or significant leg swelling.  Denies any falls hitting her head  Physical Exam   Triage Vital Signs: ED Triage Vitals  Encounter Vitals Group     BP 11/15/22 1629 105/64     Systolic BP Percentile --      Diastolic BP Percentile --      Pulse Rate 11/15/22 1629 86     Resp 11/15/22 1629 18     Temp 11/15/22 1629 (!) 97.5 F (36.4 C)     Temp Source 11/15/22 1629 Oral     SpO2 11/15/22 1629 96 %     Weight 11/15/22 1630 (!) 320 lb (145.2 kg)     Height 11/15/22 1630 5\' 3"  (1.6 m)     Head Circumference --      Peak Flow --      Pain Score 11/15/22 1630 0     Pain Loc --      Pain Education --      Exclude from Growth Chart --     Most recent vital signs: Vitals:   11/15/22 1629  BP: 105/64  Pulse: 86  Resp: 18  Temp: (!) 97.5 F (36.4 C)  SpO2: 96%     General: Awake, no distress.  CV:  Good peripheral perfusion.  Resp:  Normal effort.   Abd:  No distention.  Soft and nontender Other:  Clear nerves II through XII are intact.  Normal finger-to-nose.  No ataxia.   ED Results / Procedures / Treatments   Labs (all labs ordered are listed, but only abnormal results are displayed) Labs Reviewed  CBC - Abnormal; Notable for the following components:      Result Value   WBC 16.0 (*)    All other components within normal limits  CBG MONITORING, ED - Abnormal; Notable for the following components:   Glucose-Capillary 342 (*)    All other components within normal limits  BASIC METABOLIC PANEL  URINALYSIS, ROUTINE W REFLEX MICROSCOPIC  CBG MONITORING, ED  CBG MONITORING, ED  TROPONIN I (HIGH SENSITIVITY)     EKG  My interpretation of EKG:  Normal sinus rate of 89 without any ST elevation or T wave inversions, normal intervals    PROCEDURES:  Critical Care performed: No  Procedures   MEDICATIONS ORDERED IN  ED: Medications  sodium chloride 0.9 % bolus 1,000 mL (has no administration in time range)     IMPRESSION / MDM / ASSESSMENT AND PLAN / ED COURSE  I reviewed the triage vital signs and the nursing notes.   Patient's presentation is most consistent with acute presentation with potential threat to life or bodily function.   Patient comes in hyperglycemic most likely from the steroids injections.  Her white count is elevated probably from the steroids.  She denies any other infectious symptoms such as UTI, pneumonia.  Abdomen is soft and nontender.  We discussed pluses and minuses of fluid but she denies feeling like she has a ton of fluid on her and she would like to proceed with the fluid.  Will also give some insulin.  We discussed how this can just be temporary and her sugars may spike up again but sugars typically in the 300s do not cause any severe's issues such as diabetic coma as long as this is only temporary in nature.  We discussed however long-term control is definitely important but this should  just be temporary with the injections.  She expressed understanding and felt comfortable with this plan.  Her neuroexam is intact.  She has got normal finger-to-nose.  No vision changes.  She is ambulatory without any ataxia.  I doubt stroke.  Her sugars have come down to the 300s after some IV insulin.  We discussed giving her additional insulin and needing to have further monitoring versus going home.  She reports having resolution of symptoms and would prefer to be discharged at this time.  She understands the importance of return to the ER if develops any return or worsening symptoms.  The patient is on the cardiac monitor to evaluate for evidence of arrhythmia and/or significant heart rate changes.      FINAL CLINICAL IMPRESSION(S) / ED DIAGNOSES   Final diagnoses:  Hyperglycemia  Steroid-induced hyperglycemia     Rx / DC Orders   ED Discharge Orders     None        Note:  This document was prepared using Dragon voice recognition software and may include unintentional dictation errors.   Concha Se, MD 11/15/22 2035

## 2022-11-15 NOTE — Telephone Encounter (Signed)
  Called pt and she stated that she is on her way to the ER now.

## 2022-11-19 DIAGNOSIS — G4733 Obstructive sleep apnea (adult) (pediatric): Secondary | ICD-10-CM | POA: Diagnosis not present

## 2022-11-20 ENCOUNTER — Encounter: Payer: Self-pay | Admitting: Family

## 2022-11-21 ENCOUNTER — Other Ambulatory Visit: Payer: Self-pay | Admitting: Nurse Practitioner

## 2022-11-21 ENCOUNTER — Ambulatory Visit: Payer: Medicaid Other | Admitting: Family

## 2022-11-21 DIAGNOSIS — G4733 Obstructive sleep apnea (adult) (pediatric): Secondary | ICD-10-CM

## 2022-11-22 ENCOUNTER — Ambulatory Visit: Payer: Medicaid Other | Admitting: Dermatology

## 2022-11-22 DIAGNOSIS — B079 Viral wart, unspecified: Secondary | ICD-10-CM

## 2022-11-22 DIAGNOSIS — Z7189 Other specified counseling: Secondary | ICD-10-CM | POA: Diagnosis not present

## 2022-11-22 NOTE — Patient Instructions (Addendum)
CANDIDA ALBICANS IMMUNOTHERAPY OF WARTS INSTRUCTIONS   Candida immunotherapy injections for warts are usually quite safe and tolerated well by most people.However, mild to moderate itching, tenderness or swelling at the injection site is common and usually lasts 24 to 48 hours. If you are uncomfortable, you may:  Elevate the painful area Apply ice wrapped in a towel for five minutes on and five minutes off Take Tylenol every 4-6 hours (NOT ibuprofen, aspirin, Advil, Aleve or Motrin due to the anti-inflammatory properties, since the goal is to generate an inflammatory reaction to kill the wart virus)  Only 5% of Candida immunotherapy patients get flu-like symptoms (such as muscle and joint aches, chills, or headaches). Rarely, fever can also occur. Usually these symptoms last only 24 - 48 hours. If any of these symptoms occur, taking Tylenol every four to six hours really helps. This can be prevented in subsequent office visits by taking Tylenol BEFORE your next injection.(Remember, DO NOT take ibuprofen, aspirin, Advil, Aleve or Motrin).  Rarely,  widespread hives (itchy welts) may occur from this treatment. If this occurs, take oral Benadryl (may make drowsy) every four to six hours as needed for welts /hives.  Alternatively, you may take a non-sedating antihistamine such as Claritin, Allegra or Zyrtec for welts/hives.  Our phone number is [phone]. If you have any serious or persistent problems, please contact our office for further directions.     Due to recent changes in healthcare laws, you may see results of your pathology and/or laboratory studies on MyChart before the doctors have had a chance to review them. We understand that in some cases there may be results that are confusing or concerning to you. Please understand that not all results are received at the same time and often the doctors may need to interpret multiple results in order to provide you with the best plan of care or  course of treatment. Therefore, we ask that you please give [place] 2 business days to thoroughly review all your results before contacting the office for clarification. Should we see a critical lab result, you will be contacted sooner.   If You Need Anything After Your Visit  If you have any questions or concerns for your doctor, please call our main line at [phone] and press option 4 to reach your doctor's medical assistant. If no one answers, please leave a voicemail as directed and we will return your call as soon as possible. Messages left after 4 pm will be answered the following business day.   You may also send [place] a message via MyChart. We typically respond to MyChart messages within 1-2 business days.  For prescription refills, please ask your pharmacy to contact our office. Our fax number is [phone].  If you have an urgent issue when the clinic is closed that cannot wait until the next business day, you can page your doctor at the number below.    Please note that while we do our best to be available for urgent issues outside of office hours, we are not available 24/7.   If you have an urgent issue and are unable to reach [place], you may choose to seek medical care at your doctor's office, retail clinic, urgent care center, or emergency room.  If you have a medical emergency, please immediately call 911 or go to the emergency department.  Pager Numbers  - Dr. Gwen Pounds: [phone]  - Dr. Roseanne Reno: [phone]  - Dr. Katrinka Blazing: [phone]   In the event of inclement weather,  please call our main line at [phone] for an update on the status of any delays or closures.  Dermatology Medication Tips: Please keep the boxes that topical medications come in in order to help keep track of the instructions about where and how to use these. Pharmacies typically print the medication instructions only on the boxes and not directly on the medication tubes.   If your medication is too  expensive, please contact our office at [phone] option 4 or send [place] a message through MyChart.   We are unable to tell what your co-pay for medications will be in advance as this is different depending on your insurance coverage. However, we may be able to find a substitute medication at lower cost or fill out paperwork to get insurance to cover a needed medication.   If a prior authorization is required to get your medication covered by your insurance company, please allow [place] 1-2 business days to complete this process.  Drug prices often vary depending on where the prescription is filled and some pharmacies may offer cheaper prices.  The website www.goodrx.com contains coupons for medications through different pharmacies. The prices here do not account for what the cost may be with help from insurance (it may be cheaper with your insurance), but the website can give you the price if you did not use any insurance.  - You can print the associated coupon and take it with your prescription to the pharmacy.  - You may also stop by our office during regular business hours and pick up a GoodRx coupon card.  - If you need your prescription sent electronically to a different pharmacy, notify our office through [facility] or by phone at [phone] option 4.     Si Usted Necesita Algo Despus de Su Visita  Tambin puede enviarnos un mensaje a travs de Clinical cytogeneticist. Por lo general respondemos a los mensajes de MyChart en el transcurso de 1 a 2 das hbiles.  Para renovar recetas, por favor pida a su farmacia que se ponga en contacto con nuestra oficina. Annie Sable de fax es [place] [phone].  Si tiene un asunto urgente cuando la clnica est cerrada y que no puede esperar hasta el siguiente da hbil, puede llamar/localizar a su doctor(a) al nmero que aparece a continuacin.   Por favor, tenga en cuenta que aunque hacemos todo lo posible para estar disponibles para asuntos urgentes fuera  del horario de [place], no estamos disponibles las 24 horas del da, los 7 [address]  [address] de la [place].   Si tiene un problema urgente y no puede comunicarse con nosotros, puede optar por buscar atencin mdica  en el consultorio de su doctor(a), en una clnica privada, en un centro de atencin urgente o en una sala de emergencias.  Si tiene Engineer, drilling, por favor llame inmediatamente al 911 o vaya a la sala de emergencias.  Nmeros de bper  - Dr. Gwen Pounds: [phone]  - Dra. Roseanne Reno: [phone]  - Dr. Katrinka Blazing: [phone]   En caso de inclemencias del tiempo, por favor llame a Lacy Duverney principal al [phone] para una actualizacin sobre el [place] de cualquier retraso o cierre.  Consejos para la medicacin en dermatologa: Por favor, guarde las cajas en las que vienen los medicamentos de uso tpico para ayudarle a seguir las instrucciones sobre dnde y cmo usarlos. Las farmacias generalmente imprimen las instrucciones del medicamento slo en las cajas y no directamente en los tubos del [place].   Si su medicamento es [employer]  caro, por favor, pngase en contacto con nuestra oficina llamando al [phone] y presione la opcin 4 o envenos un mensaje a travs de Clinical cytogeneticist.   No podemos decirle cul ser su copago por los medicamentos por adelantado ya que esto es diferente dependiendo de la cobertura de su seguro. Sin embargo, es posible que podamos encontrar un medicamento sustituto a Audiological scientist un formulario para que el seguro cubra el medicamento que se considera necesario.   Si se requiere una autorizacin previa para que su compaa de seguros [place] su medicamento, por favor permtanos de 1 a 2 das hbiles para completar [address]  Los precios de los medicamentos varan con frecuencia dependiendo del Environmental consultant de dnde se surte la receta y alguna farmacias pueden ofrecer precios ms baratos.  El sitio web www.goodrx.com tiene cupones para medicamentos de  Health and safety inspector. Los precios aqu no tienen en cuenta lo que podra costar con la ayuda del seguro (puede ser ms barato con su seguro), pero el sitio web puede darle el precio si no utiliz Tourist information centre manager.  - Puede imprimir el cupn correspondiente y llevarlo con su receta a la farmacia.  - Tambin puede pasar por nuestra oficina durante el horario de atencin regular y Education officer, museum una tarjeta de cupones de GoodRx.  - Si necesita que su receta se enve electrnicamente a una farmacia diferente, informe a nuestra oficina a travs de MyChart de [place] o por telfono llamando al [phone] y presione la opcin 4.

## 2022-11-22 NOTE — Progress Notes (Signed)
   Follow-Up Visit   Subjective  Heather Cain is a 52 y.o. female who presents for the following: Warts of the right thumb, 3 month follow-up. Warts got smaller after cryotherapy, squaric acid, and cantharidin plus, but they grow back.   The following portions of the chart were reviewed this encounter and updated as appropriate: medications, allergies, medical history  Review of Systems:  No other skin or systemic complaints except as noted in HPI or Assessment and Plan.  Objective  Well appearing patient in no apparent distress; mood and affect are within normal limits.  Areas Examined: Right thumb  Relevant physical exam findings are noted in the Assessment and Plan.  Right Thumb x 2 (2) verrucous papules x 2 of the right thumb, 0.4cm    Assessment & Plan   Viral warts, unspecified type (2) Right Thumb x 2  Cryotherapy today followed by Candida injection (1st injection)  Viral Wart (HPV) Counseling  Discussed viral / HPV (Human Papilloma Virus) etiology and risk of spread /infectivity to other areas of body as well as to other people.  Multiple treatments and methods may be required to clear warts and it is possible treatment may not be successful.  Treatment risks include discoloration; scarring and there is still potential for wart recurrence.  Destruction of lesion - Right Thumb x 2 (2) Complexity: simple   Destruction method: cryotherapy   Informed consent: discussed and consent obtained   Timeout:  patient name, date of birth, surgical site, and procedure verified Lesion destroyed using liquid nitrogen: Yes   Region frozen until ice ball extended beyond lesion: Yes   Outcome: patient tolerated procedure well with no complications   Post-procedure details: wound care instructions given    Intralesional injection - Right Thumb x 2 (2) Location: right thumb  Informed Consent: Discussed risks (infection, pain, bleeding, bruising, thinning of the skin, loss of skin  pigment, lack of resolution, and recurrence of lesion) and benefits of the procedure, as well as the alternatives. Informed consent was obtained. Preparation: The area was prepared a standard fashion.  Procedure Details: An intralesional injection was performed with candida antigen. 0.1 cc in total were injected.  Total number of injections: 1  Plan: The patient was instructed on post-op care. Recommend OTC analgesia as needed for pain.  Lot No. [id number] Exp 07/13/2023    No follow-ups on file.  Wendee Beavers, CMA, am acting as scribe for Armida Sans, MD .   Documentation: I have reviewed the above documentation for accuracy and completeness, and I agree with the above.  Armida Sans, MD

## 2022-11-27 DIAGNOSIS — F4310 Post-traumatic stress disorder, unspecified: Secondary | ICD-10-CM | POA: Diagnosis not present

## 2022-11-28 ENCOUNTER — Ambulatory Visit: Payer: Medicaid Other | Admitting: Family

## 2022-11-29 ENCOUNTER — Ambulatory Visit: Payer: Medicaid Other | Attending: Cardiology | Admitting: Cardiology

## 2022-11-29 ENCOUNTER — Encounter: Payer: Self-pay | Admitting: Dermatology

## 2022-11-29 ENCOUNTER — Encounter: Payer: Self-pay | Admitting: Cardiology

## 2022-11-29 VITALS — BP 118/64 | HR 98 | Ht 63.0 in | Wt 326.4 lb

## 2022-11-29 DIAGNOSIS — I480 Paroxysmal atrial fibrillation: Secondary | ICD-10-CM | POA: Diagnosis not present

## 2022-11-29 DIAGNOSIS — I471 Supraventricular tachycardia, unspecified: Secondary | ICD-10-CM | POA: Diagnosis not present

## 2022-11-29 DIAGNOSIS — G4733 Obstructive sleep apnea (adult) (pediatric): Secondary | ICD-10-CM | POA: Diagnosis not present

## 2022-11-29 DIAGNOSIS — D6869 Other thrombophilia: Secondary | ICD-10-CM

## 2022-11-29 NOTE — Patient Instructions (Signed)
Medication Instructions:   Your physician recommends that you continue on your current medications as directed. Please refer to the Current Medication list given to you today.  *If you need a refill on your cardiac medications before your next appointment, please call your pharmacy*   Lab Work:  NONE  If you have labs (blood work) drawn today and your tests are completely normal, you will receive your results only by: MyChart Message (if you have MyChart) OR A paper copy in the mail If you have any lab test that is abnormal or we need to change your treatment, we will call you to review the results.   Testing/Procedures:  NONE   Follow-Up: At [facility], you and your health needs are our priority.  As part of our continuing mission to provide you with exceptional heart care, we have created designated Provider Care Teams.  These Care Teams include your primary Cardiologist (physician) and Advanced Practice Providers (APPs -  Physician Assistants and Nurse Practitioners) who all work together to provide you with the care you need, when you need it.  We recommend signing up for the patient portal called "MyChart".  Sign up information is provided on this After Visit Summary.  MyChart is used to connect with patients for Virtual Visits (Telemedicine).  Patients are able to view lab/test results, encounter notes, upcoming appointments, etc.  Non-urgent messages can be sent to your provider as well.   To learn more about what you can do with MyChart, go to [email or web address].    Your next appointment:   6 month(s)  Provider:   Sherie Don, NP

## 2022-11-29 NOTE — Progress Notes (Signed)
Cardiology Office Note Date:  11/29/2022  Patient ID:  Heather Cain, Heather Cain 01-15-1970, MRN [id number] PCP:  Kara Dies, NP  Cardiologist:  None Electrophysiologist: None    Chief Complaint: Afib, palpitations  History of Present Illness: Heather Cain is a 52 y.o. female with PMH notable for parox AFib, SVT, OSA, T2DM, HLD, anxiety, lower leg edema, s/p bilat mastectomy; seen today routine EP follow-up.    She presented to [facility] ER 7/20 with lightheadedness, dizzy, mild headache, palpitations. PACs noted on tele during ER. On follow-up PCP ordered 2 wk zio. Her monitor showed parox AF, and 10% SVT burden. I saw her quickly thereafter and initiated eliquis for chads-vasc of 2. Toprol started. Echo showed nl LVEF, normal LA, mild MR.  I saw her in follow-up 9/20204 where she continued to have palpitation episodes, metop was increased. She called clinic afterwards c/o increased fatigue and hypotension. Metop lowered again.   On follow-up today, she is doing well. Continues to have brief palpitation episodes, but they are short in duration, less than 5 minutes. She has recently joined a pool and has been swimming regularly for exercise.  She recently had several steroid injections in hands that cause her BG to be quite elevated and went to ER. She did nto have increased palpitation episodes with steroid injections.  She continues to struggle with dietary indiscretions, plans to discuss increasing ozempic dose with PCP during visit soon.   Diligently takes eliquis BID, no bleeding concerns.    Denies chest pain, chest pressure, SOB. Stable lower extremity edema.   AAD History: none  Past Medical History:  Diagnosis Date   DCIS (ductal carcinoma in situ) of breast    Diabetes mellitus without complication (HCC)    Family history of bladder cancer    Family history of uterine cancer    High cholesterol    Neuromuscular disorder (HCC)    peripheral neuropathy   PONV  (postoperative nausea and vomiting)    Vaginal Pap smear, abnormal     Past Surgical History:  Procedure Laterality Date   BREAST BIOPSY Right 03/08/2021   right breast bx coil clip -positive   BREAST BIOPSY Right 03/24/2021   stereo biopsy x clip/DUCTAL CARCINOMA IN SITU (DCIS),   BREAST BIOPSY Left 05/18/2021   ribbon clip   BREAST BIOPSY Right 05/18/2021   coil clip   COLONOSCOPY WITH PROPOFOL N/A 03/08/2022   Procedure: COLONOSCOPY WITH PROPOFOL;  Surgeon: Wyline Mood, MD;  [facility] ENDOSCOPY;  Service: Gastroenterology;  Laterality: N/A;  REQUESTED LAST CASE   CRYOTHERAPY     EVACUATION BREAST HEMATOMA Right 06/16/2021   Procedure: EVACUATION HEMATOMA BREAST;  Surgeon: Campbell Lerner, MD;  Location: [facility] ORS;  Service: General;  Laterality: Right;   MASTECTOMY     SIMPLE MASTECTOMY WITH AXILLARY SENTINEL NODE BIOPSY Right 06/15/2021   Procedure: SIMPLE MASTECTOMY WITH AXILLARY SENTINEL NODE BIOPSY, Total;  Surgeon: Campbell Lerner, MD;  Location: [facility] ORS;  Service: General;  Laterality: Right;   TOTAL MASTECTOMY Left 06/15/2021   Procedure: TOTAL MASTECTOMY, prophylactic left;  Surgeon: Campbell Lerner, MD;  Location: [facility] ORS;  Service: General;  Laterality: Left;   WRIST SURGERY      Current Outpatient Medications  Medication Instructions   Accu-Chek Softclix Lancets lancets Use as instructed   albuterol (VENTOLIN HFA) 108 (90 Base) MCG/ACT inhaler 2 puffs, Inhalation, Every 6 hours PRN   apixaban (ELIQUIS) 5 mg, Oral, 2 times daily   fluticasone (FLONASE) 50 MCG/ACT nasal spray  Use 2 spray(s) in each nostril once daily   furosemide (LASIX) 20 mg, Oral, Daily   gabapentin (NEURONTIN) 200 mg, 2 times daily   glimepiride (AMARYL) 4 mg, Oral, Daily before breakfast   glucose blood (ACCU-CHEK GUIDE) test strip Use as directed   hydrocortisone 2.5 % cream apply to face once a day Tues, Thurs, Sat   ketoconazole (NIZORAL) 2 % shampoo apply three times per week,  massage into scalp and leave in for 10 minutes before rinsing out   loratadine (CLARITIN) 10 mg, Oral, Daily   metoprolol succinate (TOPROL XL) 25 mg, Oral, Daily   mometasone (ELOCON) 0.1 % cream apply to legs 5 nights a week   OZEMPIC, 1 MG/DOSE, 4 MG/3ML SOPN INJECT 1MG  AS DIRECTED ONCE WEEKLY   rosuvastatin (CRESTOR) 20 mg, Oral, Daily    Social History:  The patient  reports that she quit smoking about 18 months ago. Her smoking use included cigarettes. She has never used smokeless tobacco. She reports current alcohol use. She reports current drug use. Drug: Marijuana.   Family History:  The patient's family history includes Bladder Cancer (age of onset: 67) in her father; Breast cancer in her maternal great-grandmother; Cancer - Other in her maternal grandfather; Skin cancer in her maternal aunt; Uterine cancer (age of onset: 70) in her mother.  ROS:  Please see the history of present illness. All other systems are reviewed and otherwise negative.   PHYSICAL EXAM:  VS:  BP 118/64 (BP Location: Left Arm, Patient Position: Sitting, Cuff Size: Large)   Pulse 98   Ht 5\' 3"  (1.6 m)   Wt (!) 326 lb 6 oz (148 kg)   SpO2 96%   BMI 57.81 kg/m  BMI: Body mass index is 57.81 kg/m.  GEN- The patient is well appearing, alert and oriented x 3 today.   Lungs- Clear to ausculation bilaterally, normal work of breathing.  Heart- Regular rate and rhythm, no murmurs, rubs or gallops Extremities- 1+ peripheral edema, warm, dry  EKG is not ordered. Personal review of EKG from  11/15/2022  shows:  SR at 89bpm; low voltage         Recent Labs: 09/29/2022: Magnesium 2.1; TSH 0.866 11/15/2022: BUN 20; Creatinine, Ser 0.74; Hemoglobin 14.5; Platelets 219; Potassium 4.2; Sodium 134  No results found for requested labs within last 365 days.   Estimated Creatinine Clearance: 117.7 mL/min (by C-G formula based on SCr of 0.74 mg/dL).   Wt Readings from Last 3 Encounters:  11/29/22 (!) 326 lb 6 oz (148  kg)  11/15/22 (!) 320 lb (145.2 kg)  09/29/22 (!) 323 lb 8 oz (146.7 kg)     Additional studies reviewed include: Previous EP, cardiology notes.   TTE, 09/20/2022  1. Left ventricular ejection fraction, by estimation, is 60 to 65%. The left ventricle has normal function. The left ventricle has no regional wall motion abnormalities. Left ventricular diastolic parameters were normal. The average left ventricular global longitudinal strain is -23.3 %.   2. Right ventricular systolic function is normal. The right ventricular size is normal. Tricuspid regurgitation signal is inadequate for assessing PA pressure.   3. The mitral valve is normal in structure. Mild mitral valve regurgitation. No evidence of mitral stenosis.   4. The aortic valve has an indeterminant number of cusps. Aortic valve regurgitation is not visualized. No aortic stenosis is present.   5. The inferior vena cava is normal in size with greater than 50% respiratory variability, suggesting right atrial pressure  of 3 mmHg.   Long term monitor, 08/27/2022 HR 61 - 250 bpm, average 92 bpm. 506 SVT episodes, longest 13.7 seconds with an average HR 145 bpm. <1% burden of AF, longest episode 44 min 14 sec with an average rate 102 bpm. Frequent supraventricular ectopy, 10.6%. Rare ventricular ectopy.  Urgent cardiology referral placed for new AF.   ASSESSMENT AND PLAN:  #) parox AFib #) SVT #) PAC #) palpitations Continues to have brief palpitations episodes, appears to have improved lately  Continue 25mg  toprol    #) Hypercoag d/t parox afib CHA2DS2-VASc Score = 2 [CHF History: 0, HTN History: 0, Diabetes History: 1, Stroke History: 0, Vascular Disease History: 0, Age Score: 0, Gender Score: 1].  Therefore, the patient's annual risk of stroke is 2.2 %.    Stroke ppx - start 5mg  eliquis BID No bleeding concerns Update CBC  #) OSA Encouraged nightly CPAP usage    Current medicines are reviewed at length with the patient  today.   The patient does not have concerns regarding her medicines.  The following changes were made today:   none  Labs/ tests ordered today include:  No orders of the defined types were placed in this encounter.    Disposition: Follow up with EP APP in 6 months   Signed, Sherie Don, NP  11/29/22  3:23 PM  Electrophysiology [facility]

## 2022-11-30 ENCOUNTER — Ambulatory Visit: Payer: Medicaid Other | Admitting: Family Medicine

## 2022-12-04 DIAGNOSIS — F4310 Post-traumatic stress disorder, unspecified: Secondary | ICD-10-CM | POA: Diagnosis not present

## 2022-12-12 DIAGNOSIS — G5603 Carpal tunnel syndrome, bilateral upper limbs: Secondary | ICD-10-CM | POA: Diagnosis not present

## 2022-12-12 DIAGNOSIS — M65321 Trigger finger, right index finger: Secondary | ICD-10-CM | POA: Diagnosis not present

## 2022-12-12 DIAGNOSIS — M25541 Pain in joints of right hand: Secondary | ICD-10-CM | POA: Diagnosis not present

## 2022-12-12 DIAGNOSIS — M25542 Pain in joints of left hand: Secondary | ICD-10-CM | POA: Diagnosis not present

## 2022-12-12 DIAGNOSIS — M19042 Primary osteoarthritis, left hand: Secondary | ICD-10-CM | POA: Diagnosis not present

## 2022-12-12 DIAGNOSIS — M7989 Other specified soft tissue disorders: Secondary | ICD-10-CM | POA: Diagnosis not present

## 2022-12-12 DIAGNOSIS — M79642 Pain in left hand: Secondary | ICD-10-CM | POA: Diagnosis not present

## 2022-12-12 DIAGNOSIS — M79641 Pain in right hand: Secondary | ICD-10-CM | POA: Diagnosis not present

## 2022-12-13 DIAGNOSIS — D1720 Benign lipomatous neoplasm of skin and subcutaneous tissue of unspecified limb: Secondary | ICD-10-CM | POA: Diagnosis not present

## 2022-12-13 DIAGNOSIS — G5602 Carpal tunnel syndrome, left upper limb: Secondary | ICD-10-CM | POA: Diagnosis not present

## 2022-12-13 DIAGNOSIS — M25542 Pain in joints of left hand: Secondary | ICD-10-CM | POA: Diagnosis not present

## 2022-12-13 DIAGNOSIS — M79642 Pain in left hand: Secondary | ICD-10-CM | POA: Diagnosis not present

## 2022-12-13 DIAGNOSIS — M25541 Pain in joints of right hand: Secondary | ICD-10-CM | POA: Diagnosis not present

## 2022-12-13 DIAGNOSIS — R202 Paresthesia of skin: Secondary | ICD-10-CM | POA: Diagnosis not present

## 2022-12-13 DIAGNOSIS — G629 Polyneuropathy, unspecified: Secondary | ICD-10-CM | POA: Diagnosis not present

## 2022-12-13 DIAGNOSIS — M79641 Pain in right hand: Secondary | ICD-10-CM | POA: Diagnosis not present

## 2022-12-13 DIAGNOSIS — M7989 Other specified soft tissue disorders: Secondary | ICD-10-CM | POA: Diagnosis not present

## 2022-12-13 DIAGNOSIS — E0843 Diabetes mellitus due to underlying condition with diabetic autonomic (poly)neuropathy: Secondary | ICD-10-CM | POA: Diagnosis not present

## 2022-12-13 DIAGNOSIS — M65321 Trigger finger, right index finger: Secondary | ICD-10-CM | POA: Diagnosis not present

## 2022-12-13 DIAGNOSIS — R200 Anesthesia of skin: Secondary | ICD-10-CM | POA: Diagnosis not present

## 2022-12-15 ENCOUNTER — Ambulatory Visit: Payer: Medicaid Other | Admitting: Nurse Practitioner

## 2022-12-15 ENCOUNTER — Encounter: Payer: Self-pay | Admitting: Nurse Practitioner

## 2022-12-15 VITALS — BP 120/70 | HR 77 | Temp 97.9°F | Ht 63.0 in | Wt 324.8 lb

## 2022-12-15 DIAGNOSIS — G4733 Obstructive sleep apnea (adult) (pediatric): Secondary | ICD-10-CM

## 2022-12-15 DIAGNOSIS — E785 Hyperlipidemia, unspecified: Secondary | ICD-10-CM

## 2022-12-15 DIAGNOSIS — Z7985 Long-term (current) use of injectable non-insulin antidiabetic drugs: Secondary | ICD-10-CM | POA: Diagnosis not present

## 2022-12-15 DIAGNOSIS — E119 Type 2 diabetes mellitus without complications: Secondary | ICD-10-CM | POA: Diagnosis not present

## 2022-12-15 DIAGNOSIS — I480 Paroxysmal atrial fibrillation: Secondary | ICD-10-CM

## 2022-12-15 DIAGNOSIS — Z23 Encounter for immunization: Secondary | ICD-10-CM | POA: Diagnosis not present

## 2022-12-15 MED ORDER — GLIMEPIRIDE 4 MG PO TABS: 4.0000 mg | ORAL_TABLET | Freq: Every day | ORAL | 3 refills | Status: DC

## 2022-12-15 MED ORDER — SEMAGLUTIDE (2 MG/DOSE) 8 MG/3ML ~~LOC~~ SOPN: 2.0000 mg | PEN_INJECTOR | SUBCUTANEOUS | 3 refills | Status: DC

## 2022-12-15 MED ORDER — ALBUTEROL SULFATE HFA 108 (90 BASE) MCG/ACT IN AERS: 2.0000 | INHALATION_SPRAY | Freq: Four times a day (QID) | RESPIRATORY_TRACT | 2 refills | Status: AC | PRN

## 2022-12-15 MED ORDER — ROSUVASTATIN CALCIUM 20 MG PO TABS: 20.0000 mg | ORAL_TABLET | Freq: Every day | ORAL | 3 refills | Status: DC

## 2022-12-15 MED ORDER — FLUTICASONE PROPIONATE 50 MCG/ACT NA SUSP: NASAL | 3 refills | Status: DC

## 2022-12-15 NOTE — Patient Instructions (Addendum)
Please go to the lab for blood work. Increased the dose of ozempic from 1 mg and 2 mg weekly.

## 2022-12-15 NOTE — Progress Notes (Signed)
Established Patient Office Visit  Subjective:  Patient ID: Heather Cain, female    DOB: [birth year]  Age: 52 y.o. MRN: [id number]  CC:  Chief Complaint  Patient presents with   Medical Management of Chronic Issues   Hospitalization Follow-up    HPI  Heather Cain presents for chronic disease follow-up.  She has a history of hypertension, hyperlipidemia, diabetes, s/p bilateral mastectomy, A-fib, SVT, OSA and anxiety.  Patient states that she has been to the ED on 11/15/2022 due to hyperglycemia glycemia likely from steroid injection.  Patient said her home blood sugar readings from the last few weeks average 110-130.  She states that it was 167's morning  She has bilateral carpal tunnel syndrome and is followed by Ortho surgery  HPI   Past Medical History:  Diagnosis Date   DCIS (ductal carcinoma in situ) of breast    Diabetes mellitus without complication (HCC)    Family history of bladder cancer    Family history of uterine cancer    High cholesterol    Neuromuscular disorder (HCC)    peripheral neuropathy   PONV (postoperative nausea and vomiting)    Vaginal Pap smear, abnormal     Past Surgical History:  Procedure Laterality Date   BREAST BIOPSY Right 03/08/2021   right breast bx coil clip -positive   BREAST BIOPSY Right 03/24/2021   stereo biopsy x clip/DUCTAL CARCINOMA IN SITU (DCIS),   BREAST BIOPSY Left 05/18/2021   ribbon clip   BREAST BIOPSY Right 05/18/2021   coil clip   COLONOSCOPY WITH PROPOFOL N/A 03/08/2022   Procedure: COLONOSCOPY WITH PROPOFOL;  Surgeon: Wyline Mood, MD;  Location: [facility] ENDOSCOPY;  Service: Gastroenterology;  Laterality: N/A;  REQUESTED LAST CASE   CRYOTHERAPY     EVACUATION BREAST HEMATOMA Right 06/16/2021   Procedure: EVACUATION HEMATOMA BREAST;  Surgeon: Campbell Lerner, MD;  Location: [facility] ORS;  Service: General;  Laterality: Right;   MASTECTOMY     SIMPLE MASTECTOMY WITH AXILLARY SENTINEL NODE BIOPSY Right 06/15/2021    Procedure: SIMPLE MASTECTOMY WITH AXILLARY SENTINEL NODE BIOPSY, Total;  Surgeon: Campbell Lerner, MD;  Location: [facility] ORS;  Service: General;  Laterality: Right;   TOTAL MASTECTOMY Left 06/15/2021   Procedure: TOTAL MASTECTOMY, prophylactic left;  Surgeon: Campbell Lerner, MD;  Location: [facility] ORS;  Service: General;  Laterality: Left;   WRIST SURGERY      Family History  Problem Relation Age of Onset   Uterine cancer Mother 72   Bladder Cancer Father 61   Skin cancer Maternal Aunt    Cancer - Other Maternal Grandfather        possible colon cancer   Breast cancer Maternal Great-grandmother     Social History   Socioeconomic History   Marital status: Single    Spouse name: Not on file   Number of children: Not on file   Years of education: Not on file   Highest education level: Some college, no degree  Occupational History   Not on file  Tobacco Use   Smoking status: Former    Current packs/day: 0.00    Types: Cigarettes    Quit date: 05/30/2021    Years since quitting: 1.5   Smokeless tobacco: Never  Vaping Use   Vaping status: Some Days  Substance and Sexual Activity   Alcohol use: Yes    Comment: social   Drug use: Yes    Types: Marijuana   Sexual activity: Not Currently    Birth control/protection: None  Other Topics Concern   Not on file  Social History Narrative   Lives in [place]; self; no children; 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 former Smoke ; rare alcohol.    Social Drivers of Health   Financial Resource Strain: High Risk (12/11/2022)   Overall Financial Resource Strain (CARDIA)    Difficulty of Paying Living Expenses: Very hard  Food Insecurity: Food Insecurity Present (12/11/2022)   Hunger Vital Sign    Worried About Running Out of Food in the Last Year: Often true    Ran Out of Food in the Last Year: Sometimes true  Transportation Needs: No Transportation Needs (12/11/2022)   PRAPARE - Administrator, Civil Service (Medical): No    Lack of  Transportation (Non-Medical): No  Physical Activity: Sufficiently Active (12/11/2022)   Exercise Vital Sign    Days of Exercise per Week: 3 days    Minutes of Exercise per Session: 60 min  Recent Concern: Physical Activity - Insufficiently Active (11/26/2022)   Exercise Vital Sign    Days of Exercise per Week: 3 days    Minutes of Exercise per Session: 20 min  Stress: Stress Concern Present (12/11/2022)   [employer] of Occupational Health - Occupational Stress Questionnaire    Feeling of Stress : To some extent  Social Connections: Socially Isolated (12/11/2022)   Social Connection and Isolation Panel [NHANES]    Frequency of Communication with Friends and Family: More than three times a week    Frequency of Social Gatherings with Friends and Family: Once a week    Attends Religious Services: Never    Database administrator or Organizations: No    Attends Banker Meetings: Never    Marital Status: Never married  Intimate Partner Violence: Not At Risk (03/16/2022)   Humiliation, Afraid, Rape, and Kick questionnaire    Fear of Current or Ex-Partner: No    Emotionally Abused: No    Physically Abused: No    Sexually Abused: No     Outpatient Medications Prior to Visit  Medication Sig Dispense Refill   Accu-Chek Softclix Lancets lancets Use as instructed 100 each 4   cholecalciferol (VITAMIN D3) 25 MCG (1000 UNIT) tablet Take by mouth daily.     furosemide (LASIX) 20 MG tablet Take 1 tablet (20 mg total) by mouth daily. (Patient taking differently: Take 20 mg by mouth 3 (three) times a week. Mon, Wed, Fri) 30 tablet 4   gabapentin (NEURONTIN) 100 MG capsule 200 mg 2 (two) times daily.     glucose blood (ACCU-CHEK GUIDE) test strip Use as directed 50 each 4   hydrocortisone 2.5 % cream apply to face once a day Tues, Thurs, Sat 30 g 11   ketoconazole (NIZORAL) 2 % shampoo apply three times per week, massage into scalp and leave in for 10 minutes before rinsing out 120 mL  3   loratadine (CLARITIN) 10 MG tablet Take 10 mg by mouth daily.     metoprolol succinate (TOPROL XL) 25 MG 24 hr tablet Take 1 tablet (25 mg total) by mouth daily. 90 tablet 0   mometasone (ELOCON) 0.1 % cream apply to legs 5 nights a week 45 g 6   OZEMPIC, 1 MG/DOSE, 4 MG/3ML SOPN INJECT 1MG  AS DIRECTED ONCE WEEKLY 3 mL 0   Salicylic Acid (NIZORAL PSORIASIS SHAMPOO/COND) 3 % SHAM Apply topically.     albuterol (VENTOLIN HFA) 108 (90 Base) MCG/ACT inhaler Inhale 2 puffs into the lungs every 6 (six) hours as needed  for wheezing or shortness of breath. 8 g 0   apixaban (ELIQUIS) 5 MG TABS tablet Take 1 tablet (5 mg total) by mouth 2 (two) times daily. 60 tablet 3   fluticasone (FLONASE) 50 MCG/ACT nasal spray Use 2 spray(s) in each nostril once daily 16 g 0   glimepiride (AMARYL) 4 MG tablet Take 1 tablet (4 mg total) by mouth daily before breakfast. 90 tablet 1   rosuvastatin (CRESTOR) 20 MG tablet Take 1 tablet (20 mg total) by mouth daily. 30 tablet 4   No facility-administered medications prior to visit.    Allergies  Allergen Reactions   Codeine Itching and Anaphylaxis    ROS Review of Systems Negative unless indicated in HPI.    Objective:    Physical Exam Constitutional:      Appearance: Normal appearance.  HENT:     Mouth/Throat:     Mouth: Mucous membranes are moist.  Eyes:     Conjunctiva/sclera: Conjunctivae normal.     Pupils: Pupils are equal, round, and reactive to light.  Cardiovascular:     Rate and Rhythm: Normal rate and regular rhythm.     Pulses: Normal pulses.     Heart sounds: Normal heart sounds.  Pulmonary:     Effort: Pulmonary effort is normal.     Breath sounds: Normal breath sounds.  Abdominal:     General: Bowel sounds are normal.     Palpations: Abdomen is soft.  Musculoskeletal:     Cervical back: Normal range of motion. No tenderness.  Skin:    General: Skin is warm.     Findings: No bruising.  Neurological:     General: No focal  deficit present.     Mental Status: She is alert and oriented to person, place, and time. Mental status is at baseline.  Psychiatric:        Mood and Affect: Mood normal.        Behavior: Behavior normal.        Thought Content: Thought content normal.        Judgment: Judgment normal.     BP 120/70   Pulse 77   Temp 97.9 F (36.6 C) (Oral)   Ht 5\' 3"  (1.6 m)   Wt (!) 324 lb 12.8 oz (147.3 kg)   SpO2 97%   BMI 57.54 kg/m  Wt Readings from Last 3 Encounters:  12/15/22 (!) 324 lb 12.8 oz (147.3 kg)  11/29/22 (!) 326 lb 6 oz (148 kg)  11/15/22 (!) 320 lb (145.2 kg)     Health Maintenance  Topic Date Due   HIV Screening  Never done   Hepatitis C Screening  Never done   COVID-19 Vaccine (3 - Moderna risk series) 05/13/2019   FOOT EXAM  07/23/2022   MAMMOGRAM  04/28/2023   Diabetic kidney evaluation - Urine ACR  06/12/2023   HEMOGLOBIN A1C  06/15/2023   OPHTHALMOLOGY EXAM  08/04/2023   Diabetic kidney evaluation - eGFR measurement  12/15/2023   Colonoscopy  03/08/2025   Cervical Cancer Screening (HPV/Pap Cotest)  05/05/2025   DTaP/Tdap/Td (3 - Td or Tdap) 02/11/2032   INFLUENZA VACCINE  Completed   Zoster Vaccines- Shingrix  Completed   HPV VACCINES  Aged Out    There are no preventive care reminders to display for this patient.  Lab Results  Component Value Date   TSH 1.550 12/15/2022   Lab Results  Component Value Date   WBC 5.1 12/15/2022   HGB 14.2 12/15/2022  HCT 42.8 12/15/2022   MCV 91 12/15/2022   PLT 190 12/15/2022   Lab Results  Component Value Date   NA 142 12/15/2022   K 4.3 12/15/2022   CO2 25 12/15/2022   GLUCOSE 182 (H) 12/15/2022   BUN 12 12/15/2022   CREATININE 0.78 12/15/2022   BILITOT 0.4 12/15/2022   ALKPHOS 105 12/15/2022   AST 24 12/15/2022   ALT 26 12/15/2022   PROT 6.8 12/15/2022   ALBUMIN 4.0 12/15/2022   CALCIUM 9.4 12/15/2022   ANIONGAP 11 11/15/2022   EGFR 91 12/15/2022   Lab Results  Component Value Date   CHOL 109  12/15/2022   Lab Results  Component Value Date   HDL 35 (L) 12/15/2022   Lab Results  Component Value Date   LDLCALC 39 12/15/2022   Lab Results  Component Value Date   TRIG 222 (H) 12/15/2022   Lab Results  Component Value Date   CHOLHDL 3.1 12/15/2022   Lab Results  Component Value Date   HGBA1C 8.6 (H) 12/15/2022      Assessment & Plan:  Paroxysmal A-fib (HCC) Assessment & Plan: Stable on Eliquis. Followed by cardiology   Obstructive sleep apnea Assessment & Plan: Stable on CPAP.  Orders: -     Fluticasone Propionate; Use 2 spray(s) in each nostril once daily  Dispense: 16 g; Refill: 3 -     Albuterol Sulfate HFA; Inhale 2 puffs into the lungs every 6 (six) hours as needed for wheezing or shortness of breath.  Dispense: 8 g; Refill: 2 -     TSH  Type 2 diabetes mellitus without complication, without long-term current use of insulin (HCC) Assessment & Plan: Advised pt to monitor diet and exercise Advised pt to eat variety of food including fruits, vegetables, whole grains, complex carbohydrates and proteins.  Increase Ozempic from 1 mg   to 2 mg weekly.  Call continue glimepiride 4 mg daily. Will check hemoglobin A1c   Orders: -     Glimepiride; Take 1 tablet (4 mg total) by mouth daily before breakfast.  Dispense: 90 tablet; Refill: 3 -     Semaglutide (2 MG/DOSE); Inject 2 mg as directed once a week.  Dispense: 3 mL; Refill: 3 -     Hemoglobin A1c -     Lipid panel  Need for influenza vaccination -     Flu vaccine trivalent PF, 6mos and older(Flulaval,Afluria,Fluarix,Fluzone)  Hyperlipidemia, unspecified hyperlipidemia type Assessment & Plan: Continue Crestor 20 Mg. Will check lipid panel.  Orders: -     Rosuvastatin Calcium; Take 1 tablet (20 mg total) by mouth daily.  Dispense: 90 tablet; Refill: 3 -     CBC with Differential/Platelet -     Comprehensive metabolic panel -     Lipid panel    Follow-up: Return in about 6 months (around  06/15/2023).   Kara Dies, NP

## 2022-12-16 LAB — COMPREHENSIVE METABOLIC PANEL
ALT: 26 [IU]/L (ref 0–32)
AST: 24 [IU]/L (ref 0–40)
Albumin: 4 g/dL (ref 3.8–4.9)
Alkaline Phosphatase: 105 [IU]/L (ref 44–121)
BUN/Creatinine Ratio: 15 (ref 9–23)
BUN: 12 mg/dL (ref 6–24)
Bilirubin Total: 0.4 mg/dL (ref 0.0–1.2)
CO2: 25 mmol/L (ref 20–29)
Calcium: 9.4 mg/dL (ref 8.7–10.2)
Chloride: 103 mmol/L (ref 96–106)
Creatinine, Ser: 0.78 mg/dL (ref 0.57–1.00)
Globulin, Total: 2.8 g/dL (ref 1.5–4.5)
Glucose: 182 mg/dL — ABNORMAL HIGH (ref 70–99)
Potassium: 4.3 mmol/L (ref 3.5–5.2)
Sodium: 142 mmol/L (ref 134–144)
Total Protein: 6.8 g/dL (ref 6.0–8.5)
eGFR: 91 mL/min/{1.73_m2} (ref >59)

## 2022-12-16 LAB — CBC WITH DIFFERENTIAL/PLATELET
Basophils Absolute: 0 10*3/uL (ref 0.0–0.2)
Basos: 0 %
EOS (ABSOLUTE): 0.1 10*3/uL (ref 0.0–0.4)
Eos: 2 %
Hematocrit: 42.8 % (ref 34.0–46.6)
Hemoglobin: 14.2 g/dL (ref 11.1–15.9)
Immature Grans (Abs): 0 10*3/uL (ref 0.0–0.1)
Immature Granulocytes: 1 %
Lymphocytes Absolute: 2.3 10*3/uL (ref 0.7–3.1)
Lymphs: 45 %
MCH: 30.1 pg (ref 26.6–33.0)
MCHC: 33.2 g/dL (ref 31.5–35.7)
MCV: 91 fL (ref 79–97)
Monocytes Absolute: 0.4 10*3/uL (ref 0.1–0.9)
Monocytes: 8 %
Neutrophils Absolute: 2.2 10*3/uL (ref 1.4–7.0)
Neutrophils: 44 %
Platelets: 190 10*3/uL (ref 150–450)
RBC: 4.72 x10E6/uL (ref 3.77–5.28)
RDW: 13.9 % (ref 11.7–15.4)
WBC: 5.1 10*3/uL (ref 3.4–10.8)

## 2022-12-16 LAB — LIPID PANEL
Chol/HDL Ratio: 3.1 {ratio} (ref 0.0–4.4)
Cholesterol, Total: 109 mg/dL (ref 100–199)
HDL: 35 mg/dL — ABNORMAL LOW (ref >39)
LDL Chol Calc (NIH): 39 mg/dL (ref 0–99)
Triglycerides: 222 mg/dL — ABNORMAL HIGH (ref 0–149)
VLDL Cholesterol Cal: 35 mg/dL (ref 5–40)

## 2022-12-16 LAB — TSH: TSH: 1.55 u[IU]/mL (ref 0.450–4.500)

## 2022-12-16 LAB — HEMOGLOBIN A1C
Est. average glucose Bld gHb Est-mCnc: 200 mg/dL
Hgb A1c MFr Bld: 8.6 % — ABNORMAL HIGH (ref 4.8–5.6)

## 2022-12-18 ENCOUNTER — Other Ambulatory Visit: Payer: Self-pay | Admitting: Neurology

## 2022-12-18 DIAGNOSIS — R200 Anesthesia of skin: Secondary | ICD-10-CM

## 2022-12-19 DIAGNOSIS — G4733 Obstructive sleep apnea (adult) (pediatric): Secondary | ICD-10-CM | POA: Diagnosis not present

## 2022-12-19 DIAGNOSIS — G5603 Carpal tunnel syndrome, bilateral upper limbs: Secondary | ICD-10-CM | POA: Diagnosis not present

## 2022-12-20 DIAGNOSIS — F4310 Post-traumatic stress disorder, unspecified: Secondary | ICD-10-CM | POA: Diagnosis not present

## 2022-12-23 ENCOUNTER — Other Ambulatory Visit: Payer: Self-pay | Admitting: Cardiology

## 2022-12-23 DIAGNOSIS — I480 Paroxysmal atrial fibrillation: Secondary | ICD-10-CM

## 2022-12-25 ENCOUNTER — Other Ambulatory Visit: Payer: Self-pay | Admitting: Cardiology

## 2022-12-25 DIAGNOSIS — I480 Paroxysmal atrial fibrillation: Secondary | ICD-10-CM

## 2022-12-25 NOTE — Telephone Encounter (Signed)
Prescription refill request for Eliquis received. Indication:afib Last office visit:11/24 Scr:0.78  12/24 Age: 52 Weight:147.3  kg  Prescription refilled

## 2022-12-25 NOTE — Telephone Encounter (Signed)
Please review

## 2022-12-26 DIAGNOSIS — G5603 Carpal tunnel syndrome, bilateral upper limbs: Secondary | ICD-10-CM | POA: Diagnosis not present

## 2022-12-26 DIAGNOSIS — M65321 Trigger finger, right index finger: Secondary | ICD-10-CM | POA: Diagnosis not present

## 2022-12-27 DIAGNOSIS — F4310 Post-traumatic stress disorder, unspecified: Secondary | ICD-10-CM | POA: Diagnosis not present

## 2022-12-28 ENCOUNTER — Encounter: Payer: Self-pay | Admitting: Neurology

## 2022-12-31 NOTE — Assessment & Plan Note (Signed)
Stable on CPAP 

## 2022-12-31 NOTE — Assessment & Plan Note (Signed)
Continue Crestor 20 Mg. Will check lipid panel.

## 2022-12-31 NOTE — Assessment & Plan Note (Signed)
Advised pt to monitor diet and exercise Advised pt to eat variety of food including fruits, vegetables, whole grains, complex carbohydrates and proteins.  Increase Ozempic from 1 mg   to 2 mg weekly.  Call continue glimepiride 4 mg daily. Will check hemoglobin A1c

## 2022-12-31 NOTE — Assessment & Plan Note (Signed)
Stable on Eliquis. Followed by cardiology

## 2023-01-01 ENCOUNTER — Ambulatory Visit
Admission: RE | Admit: 2023-01-01 | Discharge: 2023-01-01 | Disposition: A | Discharge status: Home / Self Care | Payer: Medicaid Other | Source: Ambulatory Visit | Attending: Neurology | Admitting: Neurology

## 2023-01-01 DIAGNOSIS — M5127 Other intervertebral disc displacement, lumbosacral region: Secondary | ICD-10-CM | POA: Diagnosis not present

## 2023-01-01 DIAGNOSIS — M47816 Spondylosis without myelopathy or radiculopathy, lumbar region: Secondary | ICD-10-CM | POA: Diagnosis not present

## 2023-01-01 DIAGNOSIS — R200 Anesthesia of skin: Secondary | ICD-10-CM

## 2023-01-12 DIAGNOSIS — F4310 Post-traumatic stress disorder, unspecified: Secondary | ICD-10-CM | POA: Diagnosis not present

## 2023-01-19 DIAGNOSIS — G4733 Obstructive sleep apnea (adult) (pediatric): Secondary | ICD-10-CM | POA: Diagnosis not present

## 2023-01-20 ENCOUNTER — Other Ambulatory Visit: Payer: Self-pay | Admitting: Cardiology

## 2023-01-20 DIAGNOSIS — I480 Paroxysmal atrial fibrillation: Secondary | ICD-10-CM

## 2023-01-22 NOTE — Telephone Encounter (Signed)
 Last office visit: 11/29/22 with plan to f/u 6 months. next office visit: none/active recall

## 2023-01-24 DIAGNOSIS — F4310 Post-traumatic stress disorder, unspecified: Secondary | ICD-10-CM | POA: Diagnosis not present

## 2023-01-30 DIAGNOSIS — G8929 Other chronic pain: Secondary | ICD-10-CM | POA: Diagnosis not present

## 2023-01-30 DIAGNOSIS — M65321 Trigger finger, right index finger: Secondary | ICD-10-CM | POA: Diagnosis not present

## 2023-01-30 DIAGNOSIS — M5416 Radiculopathy, lumbar region: Secondary | ICD-10-CM | POA: Diagnosis not present

## 2023-01-30 DIAGNOSIS — M5442 Lumbago with sciatica, left side: Secondary | ICD-10-CM | POA: Diagnosis not present

## 2023-02-07 ENCOUNTER — Ambulatory Visit: Payer: Medicaid Other | Admitting: Dermatology

## 2023-02-07 DIAGNOSIS — F4310 Post-traumatic stress disorder, unspecified: Secondary | ICD-10-CM | POA: Diagnosis not present

## 2023-02-12 DIAGNOSIS — M5442 Lumbago with sciatica, left side: Secondary | ICD-10-CM | POA: Diagnosis not present

## 2023-02-12 DIAGNOSIS — G8929 Other chronic pain: Secondary | ICD-10-CM | POA: Diagnosis not present

## 2023-02-12 DIAGNOSIS — M5416 Radiculopathy, lumbar region: Secondary | ICD-10-CM | POA: Diagnosis not present

## 2023-02-12 DIAGNOSIS — E119 Type 2 diabetes mellitus without complications: Secondary | ICD-10-CM | POA: Diagnosis not present

## 2023-02-14 ENCOUNTER — Ambulatory Visit: Payer: Medicaid Other | Admitting: Dermatology

## 2023-02-14 ENCOUNTER — Encounter: Payer: Self-pay | Admitting: Dermatology

## 2023-02-14 DIAGNOSIS — B078 Other viral warts: Secondary | ICD-10-CM

## 2023-02-14 NOTE — Patient Instructions (Addendum)
 CANDIDA ALBICANS IMMUNOTHERAPY OF WARTS INSTRUCTIONS   Candida immunotherapy injections for warts are usually quite safe and tolerated well by most people.However, mild to moderate itching, tenderness or swelling at the injection site is common and usually lasts 24 to 48 hours. If you are uncomfortable, you may:  Elevate the painful area Apply ice wrapped in a towel for five minutes on and five minutes off Take Tylenol  every 4-6 hours (NOT ibuprofen, aspirin, Advil, Aleve or Motrin due to the anti-inflammatory properties, since the goal is to generate an inflammatory reaction to kill the wart virus)  Only 5% of Candida immunotherapy patients get flu-like symptoms (such as muscle and joint aches, chills, or headaches). Rarely, fever can also occur. Usually these symptoms last only 24 - 48 hours. If any of these symptoms occur, taking Tylenol  every four to six hours really helps. This can be prevented in subsequent office visits by taking Tylenol  BEFORE your next injection.(Remember, DO NOT take ibuprofen, aspirin, Advil, Aleve or Motrin).  Rarely,  widespread hives (itchy welts) may occur from this treatment. If this occurs, take oral Benadryl (may make drowsy) every four to six hours as needed for welts /hives.  Alternatively, you may take a non-sedating antihistamine such as Claritin , Allegra or Zyrtec for welts/hives.  Our phone number is [phone]. If you have any serious or persistent problems, please contact our office for further directions.    Cryotherapy Aftercare  Wash gently with soap and water  everyday.   Apply Vaseline and Band-Aid daily until healed.    Due to recent changes in healthcare laws, you may see results of your pathology and/or laboratory studies on MyChart before the doctors have had a chance to review them. We understand that in some cases there may be results that are confusing or concerning to you. Please understand that not all results are received at the same  time and often the doctors may need to interpret multiple results in order to provide you with the best plan of care or course of treatment. Therefore, we ask that you please give us  2 business days to thoroughly review all your results before contacting the office for clarification. Should we see a critical lab result, you will be contacted sooner.   If You Need Anything After Your Visit  If you have any questions or concerns for your doctor, please call our main line at [phone] and press option 4 to reach your doctor's medical assistant. If no one answers, please leave a voicemail as directed and we will return your call as soon as possible. Messages left after 4 pm will be answered the following business day.   You may also send us  a message via MyChart. We typically respond to MyChart messages within 1-2 business days.  For prescription refills, please ask your pharmacy to contact our office. Our fax number is [phone].  If you have an urgent issue when the clinic is closed that cannot wait until the next business day, you can page your doctor at the number below.    Please note that while we do our best to be available for urgent issues outside of office hours, we are not available 24/7.   If you have an urgent issue and are unable to reach us , you may choose to seek medical care at your doctor's office, retail clinic, urgent care center, or emergency room.  If you have a medical emergency, please immediately call 911 or go to the emergency department.  Pager Numbers  -  Dr. Hester: [phone]  - Dr. Jackquline: [phone]  - Dr. Claudene: [phone]   In the event of inclement weather, please call our main line at [phone] for an update on the status of any delays or closures.  Dermatology Medication Tips: Please keep the boxes that topical medications come in in order to help keep track of the instructions about where and how to use these. Pharmacies typically print  the medication instructions only on the boxes and not directly on the medication tubes.   If your medication is too expensive, please contact our office at [phone] option 4 or send us  a message through MyChart.   We are unable to tell what your co-pay for medications will be in advance as this is different depending on your insurance coverage. However, we may be able to find a substitute medication at lower cost or fill out paperwork to get insurance to cover a needed medication.   If a prior authorization is required to get your medication covered by your insurance company, please allow us  1-2 business days to complete this process.  Drug prices often vary depending on where the prescription is filled and some pharmacies may offer cheaper prices.  The website www.goodrx.com contains coupons for medications through different pharmacies. The prices here do not account for what the cost may be with help from insurance (it may be cheaper with your insurance), but the website can give you the price if you did not use any insurance.  - You can print the associated coupon and take it with your prescription to the pharmacy.  - You may also stop by our office during regular business hours and pick up a GoodRx coupon card.  - If you need your prescription sent electronically to a different pharmacy, notify our office through [facility] or by phone at [phone] option 4.     Si Usted Necesita Algo Despus de Su Visita  Tambin puede enviarnos un mensaje a travs de Clinical Cytogeneticist. Por lo general respondemos a los mensajes de MyChart en el transcurso de 1 a 2 das hbiles.  Para renovar recetas, por favor pida a su farmacia que se ponga en contacto con nuestra oficina. Randi lakes de fax es [place] [phone].  Si tiene un asunto urgente cuando la clnica est cerrada y que no puede esperar hasta el siguiente da hbil, puede llamar/localizar a su doctor(a) al nmero que aparece a  continuacin.   Por favor, tenga en cuenta que aunque hacemos todo lo posible para estar disponibles para asuntos urgentes fuera del horario de [place], no estamos disponibles las 24 horas del da, los 7 [phone] de la [place].   Si tiene un problema urgente y no puede comunicarse con nosotros, puede optar por buscar atencin mdica  en el consultorio de su doctor(a), en una clnica privada, en un centro de atencin urgente o en una sala de emergencias.  Si tiene engineer, drilling, por favor llame inmediatamente al 911 o vaya a la sala de emergencias.  Nmeros de bper  - Dr. Hester: [phone]  - Dra. Jackquline: [phone]  - Dr. Claudene: [phone]   En caso de inclemencias del tiempo, por favor llame a landry capes principal al [phone] para una actualizacin sobre el [place] de cualquier retraso o cierre.  Consejos para la medicacin en dermatologa: Por favor, guarde las cajas en las que vienen los medicamentos de uso tpico para ayudarle a seguir las instrucciones sobre dnde y cmo usarlos. Las farmacias generalmente imprimen las instrucciones  del medicamento slo en las cajas y no directamente en los tubos del medicamento.   Si su medicamento es muy caro, por favor, pngase en contacto con landry rieger llamando al [phone] y presione la opcin 4 o envenos un mensaje a travs de Clinical Cytogeneticist.   No podemos decirle cul ser su copago por los medicamentos por adelantado ya que esto es diferente dependiendo de la cobertura de su seguro. Sin embargo, es posible que podamos encontrar un medicamento sustituto a audiological scientist un formulario para que el seguro cubra el medicamento que se considera necesario.   Si se requiere una autorizacin previa para que su compaa de seguros cubra su medicamento, por favor permtanos de 1 a 2 das hbiles para completar este proceso.  Los precios de los medicamentos varan con frecuencia dependiendo del environmental consultant de dnde se surte la receta  y alguna farmacias pueden ofrecer precios ms baratos.  El sitio web www.goodrx.com tiene cupones para medicamentos de health and safety inspector. Los precios aqu no tienen en cuenta lo que podra costar con la ayuda del seguro (puede ser ms barato con su seguro), pero el sitio web puede darle el precio si no utiliz tourist information centre manager.  - Puede imprimir el cupn correspondiente y llevarlo con su receta a la farmacia.  - Tambin puede pasar por nuestra oficina durante el horario de atencin regular y education officer, museum una tarjeta de cupones de GoodRx.  - Si necesita que su receta se enve electrnicamente a una farmacia diferente, informe a nuestra oficina a travs de MyChart de [place] o por telfono llamando al [phone] y presione la opcin 4.

## 2023-02-14 NOTE — Progress Notes (Signed)
   Follow-Up Visit   Subjective  Heather Cain is a 53 y.o. female who presents for the following: Warts, R thumb, LN2, Candida x 1 injection, f/u, slight improvement The patient has spots, moles and lesions to be evaluated, some may be new or changing and the patient may have concern these could be cancer.   The following portions of the chart were reviewed this encounter and updated as appropriate: medications, allergies, medical history  Review of Systems:  No other skin or systemic complaints except as noted in HPI or Assessment and Plan.  Objective  Well appearing patient in no apparent distress; mood and affect are within normal limits.   A focused examination was performed of the following areas: Right hand  Relevant exam findings are noted in the Assessment and Plan.  R thumb x 2 Verrucous papules 0.35cm and 0.25cm-- Discussed viral etiology and contagion.     Assessment & Plan     OTHER VIRAL WARTS R thumb x 2 Viral Wart (HPV) Counseling  Discussed viral / HPV (Human Papilloma Virus) etiology and risk of spread /infectivity to other areas of body as well as to other people.  Multiple treatments and methods may be required to clear warts and it is possible treatment may not be successful.  Treatment risks include discoloration; scarring and there is still potential for wart recurrence.   Destruction of lesion - R thumb x 2  Destruction method: cryotherapy   Informed consent: discussed and consent obtained   Lesion destroyed using liquid nitrogen: Yes   Region frozen until ice ball extended beyond lesion: Yes   Outcome: patient tolerated procedure well with no complications   Post-procedure details: wound care instructions given   Additional details:  Prior to procedure, discussed risks of blister formation, small wound, skin dyspigmentation, or rare scar following cryotherapy. Recommend Vaseline ointment to treated areas while healing.   Intralesional injection  - R thumb x 2 Location: R thumb   Informed Consent: Discussed risks (infection, pain, bleeding, bruising, thinning of the skin, loss of skin pigment, lack of resolution, and recurrence of lesion) and benefits of the procedure, as well as the alternatives. Informed consent was obtained. Preparation: The area was prepared a standard fashion.  Procedure Details: An intralesional injection was performed with candida antigen. 0.2 cc in total were injected total to both warts  Total number of injections: 2  Plan: The patient was instructed on post-op care. Recommend OTC analgesia as needed for pain.  Lot [id number] exp 07/2023  Return in about 2 months (around 04/14/2023) for wart f/u.  I, Grayce Saunas, RMA, am acting as scribe for Rexene Rattler, MD .   Documentation: I have reviewed the above documentation for accuracy and completeness, and I agree with the above.  Rexene Rattler, MD

## 2023-02-19 DIAGNOSIS — G4733 Obstructive sleep apnea (adult) (pediatric): Secondary | ICD-10-CM | POA: Diagnosis not present

## 2023-02-20 DIAGNOSIS — M65321 Trigger finger, right index finger: Secondary | ICD-10-CM | POA: Diagnosis not present

## 2023-02-20 DIAGNOSIS — G5603 Carpal tunnel syndrome, bilateral upper limbs: Secondary | ICD-10-CM | POA: Diagnosis not present

## 2023-02-21 DIAGNOSIS — F4310 Post-traumatic stress disorder, unspecified: Secondary | ICD-10-CM | POA: Diagnosis not present

## 2023-02-27 DIAGNOSIS — M5442 Lumbago with sciatica, left side: Secondary | ICD-10-CM | POA: Diagnosis not present

## 2023-02-27 DIAGNOSIS — M5416 Radiculopathy, lumbar region: Secondary | ICD-10-CM | POA: Diagnosis not present

## 2023-02-27 DIAGNOSIS — G8929 Other chronic pain: Secondary | ICD-10-CM | POA: Diagnosis not present

## 2023-03-01 DIAGNOSIS — G4733 Obstructive sleep apnea (adult) (pediatric): Secondary | ICD-10-CM | POA: Diagnosis not present

## 2023-03-07 DIAGNOSIS — F4310 Post-traumatic stress disorder, unspecified: Secondary | ICD-10-CM | POA: Diagnosis not present

## 2023-03-09 ENCOUNTER — Other Ambulatory Visit: Payer: Self-pay | Admitting: Nurse Practitioner

## 2023-03-09 DIAGNOSIS — E119 Type 2 diabetes mellitus without complications: Secondary | ICD-10-CM

## 2023-03-12 ENCOUNTER — Other Ambulatory Visit: Payer: Self-pay | Admitting: Nurse Practitioner

## 2023-03-12 DIAGNOSIS — E119 Type 2 diabetes mellitus without complications: Secondary | ICD-10-CM

## 2023-03-13 ENCOUNTER — Telehealth: Payer: Self-pay | Admitting: Nurse Practitioner

## 2023-03-13 ENCOUNTER — Other Ambulatory Visit: Payer: Self-pay

## 2023-03-13 DIAGNOSIS — E119 Type 2 diabetes mellitus without complications: Secondary | ICD-10-CM

## 2023-03-13 NOTE — Telephone Encounter (Signed)
 Noted! Thank you

## 2023-03-13 NOTE — Telephone Encounter (Signed)
 A1c is ordered and patient is scheduled for 03/20/23.

## 2023-03-20 ENCOUNTER — Other Ambulatory Visit (INDEPENDENT_AMBULATORY_CARE_PROVIDER_SITE_OTHER)

## 2023-03-20 DIAGNOSIS — E119 Type 2 diabetes mellitus without complications: Secondary | ICD-10-CM | POA: Diagnosis not present

## 2023-03-21 DIAGNOSIS — F4310 Post-traumatic stress disorder, unspecified: Secondary | ICD-10-CM | POA: Diagnosis not present

## 2023-03-21 LAB — HEMOGLOBIN A1C: Hgb A1c MFr Bld: 7.6 % — ABNORMAL HIGH (ref 4.6–6.5)

## 2023-03-21 IMAGING — MG MM BREAST BX W/ LOC DEV 1ST LESION IMAGE BX SPEC STEREO GUIDE*R*
6 of 11 series · 6 of 19 positions shown · non-contrast
Comparison: Previous exams
COMPARISON: Previous exams

Addendum:
CLINICAL DATA: 50-year-old with biopsy-proven high-grade DCIS with
comedonecrosis involving the LOWER OUTER QUADRANT of the RIGHT
breast (associated with a coil shaped tissue marking clip) and
biopsy-proven intermediate grade DCIS involving the UPPER OUTER
QUADRANT of the RIGHT breast (associated with an X shaped tissue
marking clip). She also has a screening mammographically detected
indeterminate asymmetry associated with scattered calcifications in
the central LEFT breast which is sampled today.

[R (1 of 6)]
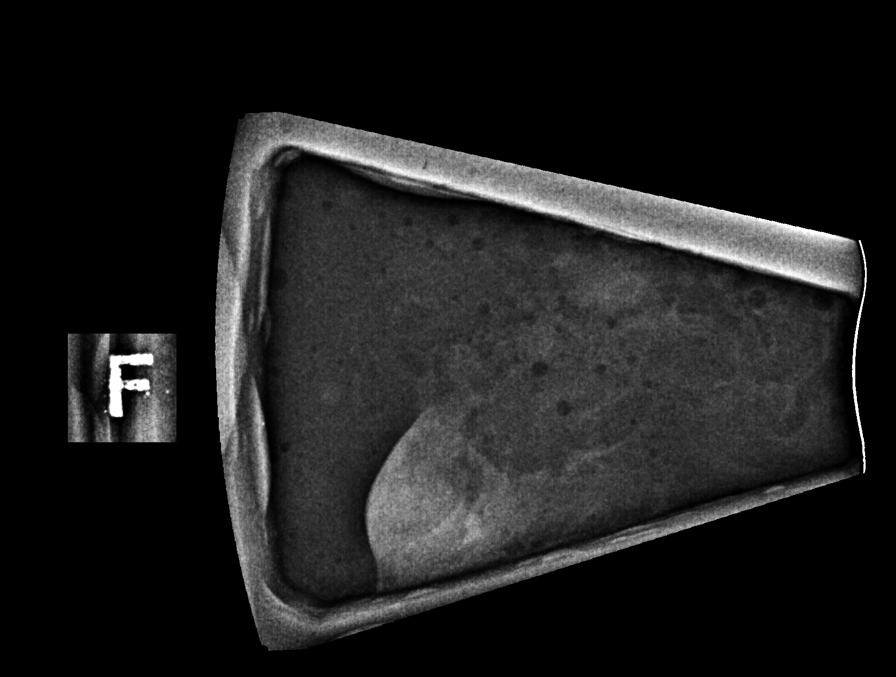

[R (2 of 6)]
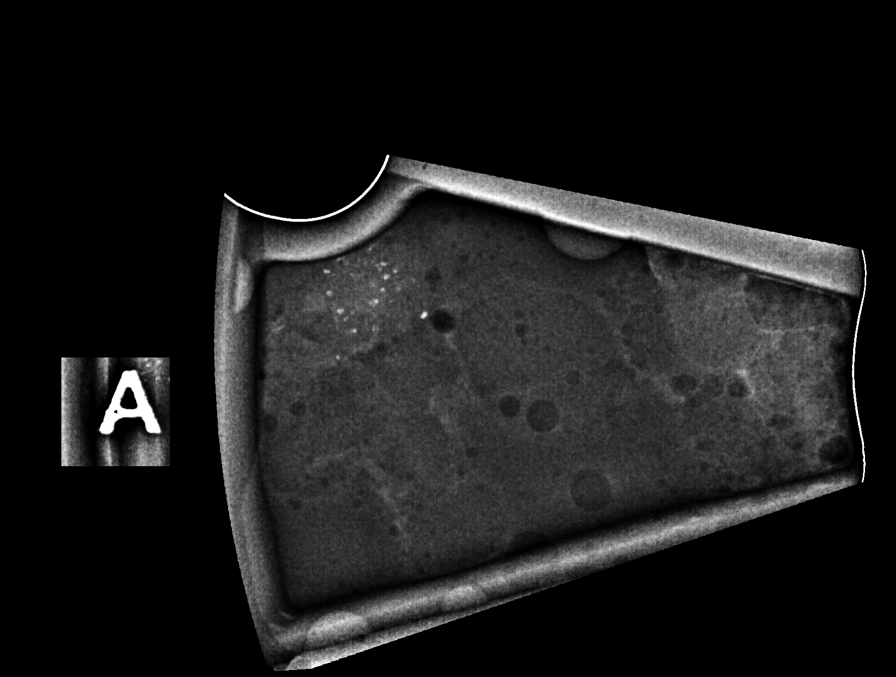

[R (3 of 6)]
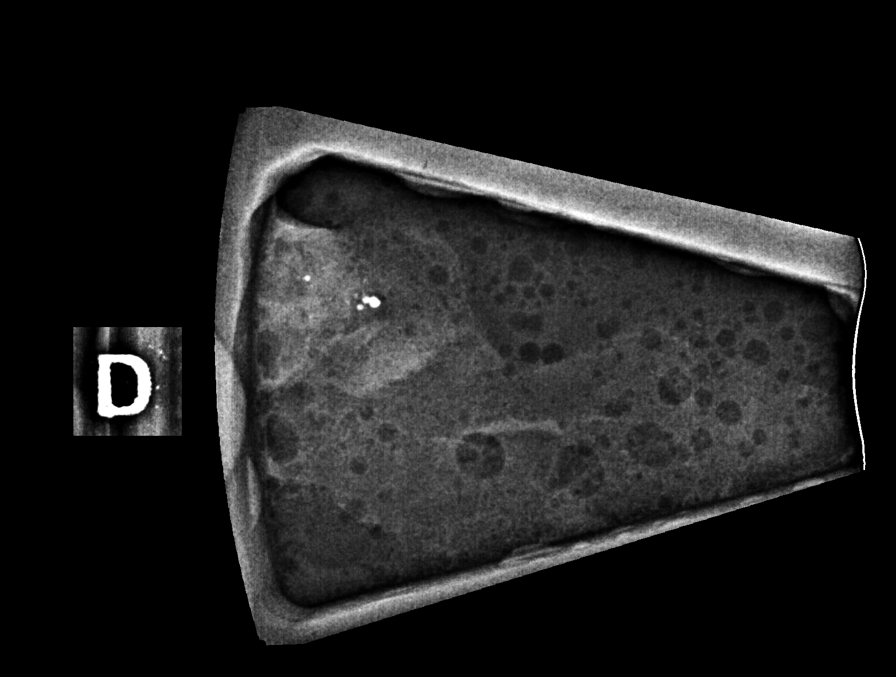

[R (4 of 6)]
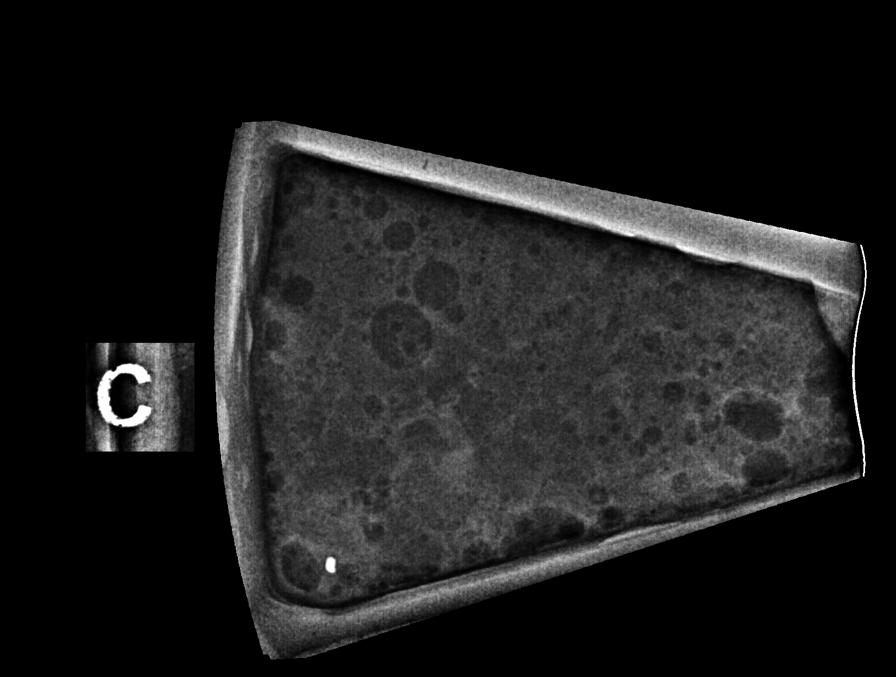

[R (5 of 6)]
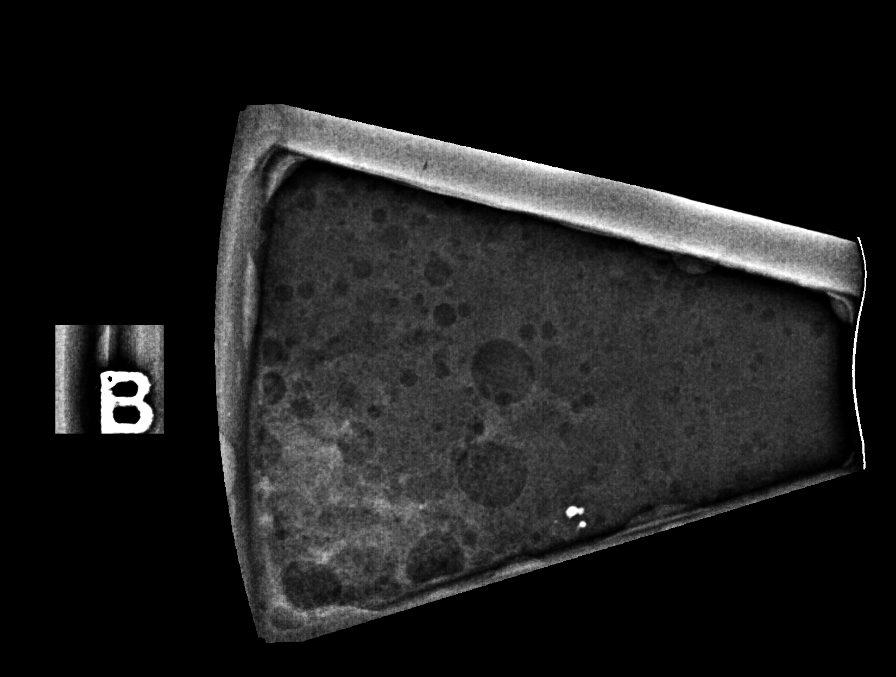

[R (6 of 6)]
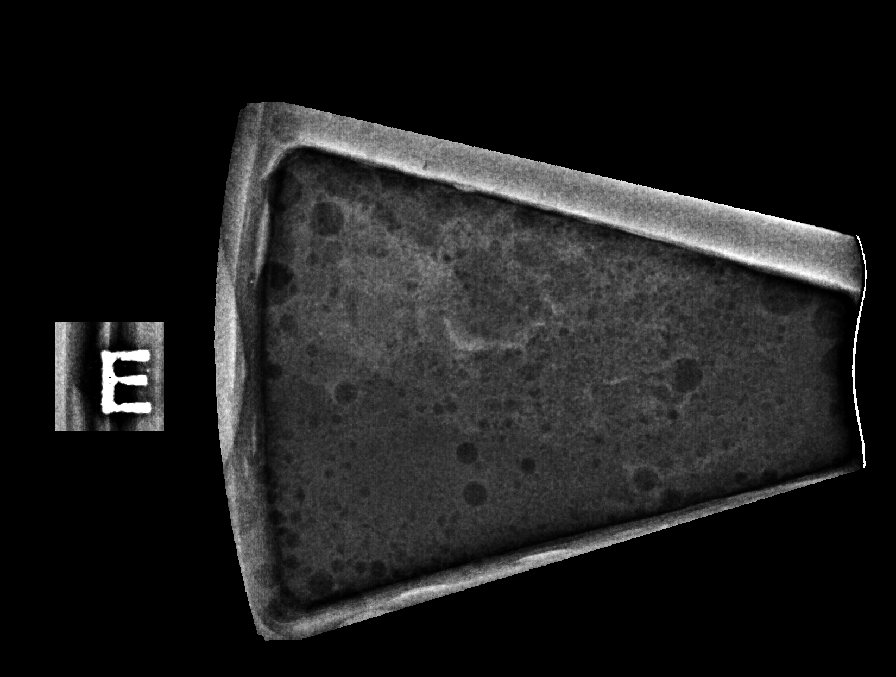

[6 of 19 positions shown; findings below may reference images not displayed]

Pretreatment MRI demonstrated non-mass enhancement extending
posteriorly from the biopsy site in the UPPER OUTER QUADRANT over an
approximate 9-10 cm length; the posterior extent of this non-mass
enhancement corresponds to a 3 cm group of calcifications on
mammography which are also sampled today.

EXAM:
LEFT BREAST STEREOTACTIC TOMOSYNTHESIS CORE NEEDLE BIOPSY

RIGHT BREAST STEREOTACTIC TOMOSYNTHESIS CORE NEEDLE BIOPSY
FINDINGS: The patient and I discussed the procedure of stereotactic-guided
biopsy including benefits and alternatives. We discussed the high
likelihood of a successful procedure. We discussed the risks of the
procedure including infection, bleeding, tissue injury, clip
migration, and inadequate sampling. Informed written consent was
given. The usual time out protocol was performed immediately prior
to the procedure.

# 1) LEFT breast, lesion quadrant: Upper central breast, near 12
o'clock location.

Using sterile technique with chlorhexidine as skin antisepsis, 1%
lidocaine and 1% lidocaine with epinephrine as local anesthetic,
under stereotactic tomosynthesis guidance, a 9 gauge Brevera vacuum
assisted device was used to perform core needle biopsy of the
asymmetry associated with calcifications in the upper central LEFT
breast using a superior approach. Specimen radiograph was performed
showing soft tissue density from the asymmetry in all 6 core samples
and calcifications in at least 2 of the core samples. Specimens with
calcifications are identified for pathology. At the conclusion of
the procedure, a ribbon shaped tissue marker clip was deployed into
the biopsy cavity.

# 2) RIGHT breast, lesion quadrant: UPPER OUTER QUADRANT.

Using sterile technique with chlorhexidine as skin antisepsis, 1%
lidocaine and 1% lidocaine with epinephrine as local anesthetic,
under stereotactic tomosynthesis guidance, a 9 gauge Brevera vacuum
assisted device was used perform core needle biopsy of the
calcifications in the UPPER OUTER QUADRANT of the RIGHT breast at
posterior depth using a superior approach. Specimen radiograph was
performed showing calcifications in at least 4 of the 6 core
samples. Specimens with calcifications are identified for pathology.
At the conclusion of procedure, a ribbon shaped tissue marker clip
was deployed into the biopsy cavity.

The patient tolerated the biopsy procedures well and there were no
apparent immediate complications. A follow-up 2-view mammogram was
performed and dictated separately.
IMPRESSION: 1. Stereotactic tomosynthesis core needle biopsy of an indeterminate
asymmetry associated with calcifications in the upper central LEFT
breast.
2. Stereotactic tomosynthesis core needle biopsy of a group of
calcifications in the UPPER OUTER QUADRANT of the RIGHT breast at
posterior depth (which correspond to the posterior extent of
non-mass enhancement on recent MRI).

ADDENDUM:
Pathology revealed USUAL DUCTAL HYPERPLASIA AND FIBROSIS- NO
DEFINITE ATYPIA IS IDENTIFIED of the LEFT breast, upper central
(ribbon clip). Note: the degree of fibrosis could correlate with
asymmetry. There are small calcifications but there are none large
enough to have been seen on mammograms. Additional deeper sections
were reviewed. This was found to be concordant by Dr. Tanu
Samsung.

Pathology revealed DUCTAL CARCINOMA IN SITU (DCIS), INTERMEDIATE
NUCLEAR GRADE WITH FOCAL NECROSIS, ASSOCIATED WITH CALCIFICATIONS of
the RIGHT breast, upper outer quadrant posterior depth (ribbon
clip). This was found to be concordant by Dr. Herbalufe Ceerciyev.

The patient reported doing well after the biopsies with tenderness
at the site on 05/20/2021. Post biopsy instructions and care were
reviewed and questions were answered. The patient was encouraged to
call [facility]
for any additional concerns. Pathology results were seen by patient
on [HOSPITAL] MYCHART. (We discussed this possibility on
05/20/2021). The patient was contacted and voicemail left on
05/23/2021 to discuss final results and recommendations.

The patient has a recent diagnosis of RIGHT breast cancer and should
follow her outlined treatment plan. Bilateral breast MRI recommended
in 6 months in order to follow-up the prior LEFT breast findings.

Pathology results reported by Eriek Badot RN on 05/23/2021.

*** End of Addendum ***
Pretreatment MRI demonstrated non-mass enhancement extending
posteriorly from the biopsy site in the UPPER OUTER QUADRANT over an
approximate 9-10 cm length; the posterior extent of this non-mass
enhancement corresponds to a 3 cm group of calcifications on
mammography which are also sampled today.

EXAM:
LEFT BREAST STEREOTACTIC TOMOSYNTHESIS CORE NEEDLE BIOPSY

RIGHT BREAST STEREOTACTIC TOMOSYNTHESIS CORE NEEDLE BIOPSY
FINDINGS: The patient and I discussed the procedure of stereotactic-guided
biopsy including benefits and alternatives. We discussed the high
likelihood of a successful procedure. We discussed the risks of the
procedure including infection, bleeding, tissue injury, clip
migration, and inadequate sampling. Informed written consent was
given. The usual time out protocol was performed immediately prior
to the procedure.

# 1) LEFT breast, lesion quadrant: Upper central breast, near 12
o'clock location.

Using sterile technique with chlorhexidine as skin antisepsis, 1%
lidocaine and 1% lidocaine with epinephrine as local anesthetic,
under stereotactic tomosynthesis guidance, a 9 gauge Brevera vacuum
assisted device was used to perform core needle biopsy of the
asymmetry associated with calcifications in the upper central LEFT
breast using a superior approach. Specimen radiograph was performed
showing soft tissue density from the asymmetry in all 6 core samples
and calcifications in at least 2 of the core samples. Specimens with
calcifications are identified for pathology. At the conclusion of
the procedure, a ribbon shaped tissue marker clip was deployed into
the biopsy cavity.

# 2) RIGHT breast, lesion quadrant: UPPER OUTER QUADRANT.

Using sterile technique with chlorhexidine as skin antisepsis, 1%
lidocaine and 1% lidocaine with epinephrine as local anesthetic,
under stereotactic tomosynthesis guidance, a 9 gauge Brevera vacuum
assisted device was used perform core needle biopsy of the
calcifications in the UPPER OUTER QUADRANT of the RIGHT breast at
posterior depth using a superior approach. Specimen radiograph was
performed showing calcifications in at least 4 of the 6 core
samples. Specimens with calcifications are identified for pathology.
At the conclusion of procedure, a ribbon shaped tissue marker clip
was deployed into the biopsy cavity.

The patient tolerated the biopsy procedures well and there were no
apparent immediate complications. A follow-up 2-view mammogram was
performed and dictated separately.
IMPRESSION: 1. Stereotactic tomosynthesis core needle biopsy of an indeterminate
asymmetry associated with calcifications in the upper central LEFT
breast.
2. Stereotactic tomosynthesis core needle biopsy of a group of
calcifications in the UPPER OUTER QUADRANT of the RIGHT breast at
posterior depth (which correspond to the posterior extent of
non-mass enhancement on recent MRI).

## 2023-03-21 IMAGING — MG MM BREAST BX W LOC DEV 1ST LESION IMAGE BX SPEC STEREO GUIDE*L*
6 of 11 series · 6 of 19 positions shown · non-contrast
Comparison: Previous exams
COMPARISON: Previous exams

Addendum:
CLINICAL DATA: 50-year-old with biopsy-proven high-grade DCIS with
comedonecrosis involving the LOWER OUTER QUADRANT of the RIGHT
breast (associated with a coil shaped tissue marking clip) and
biopsy-proven intermediate grade DCIS involving the UPPER OUTER
QUADRANT of the RIGHT breast (associated with an X shaped tissue
marking clip). She also has a screening mammographically detected
indeterminate asymmetry associated with scattered calcifications in
the central LEFT breast which is sampled today.

[L (1 of 6)]
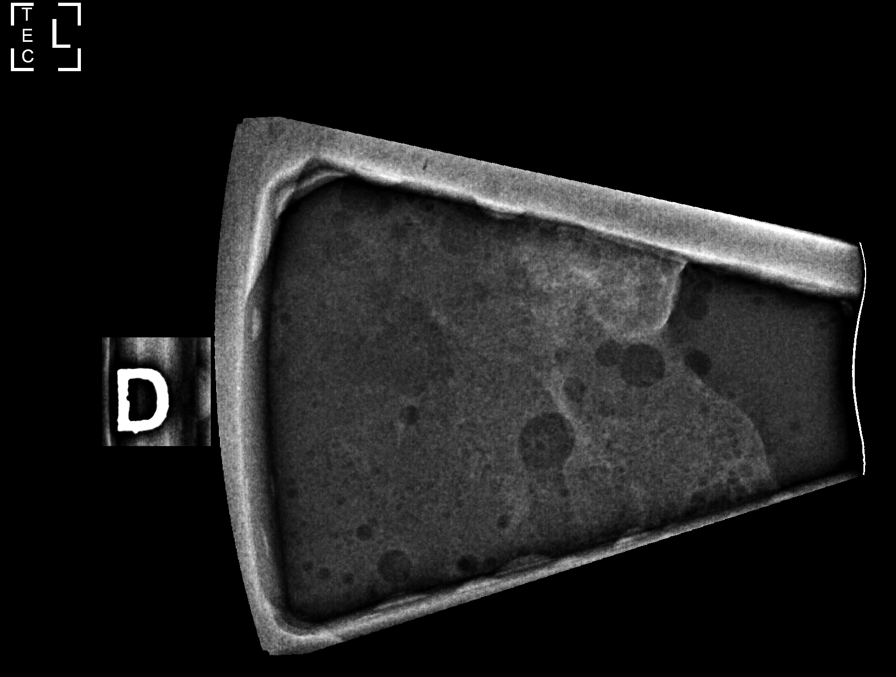

[L (2 of 6)]
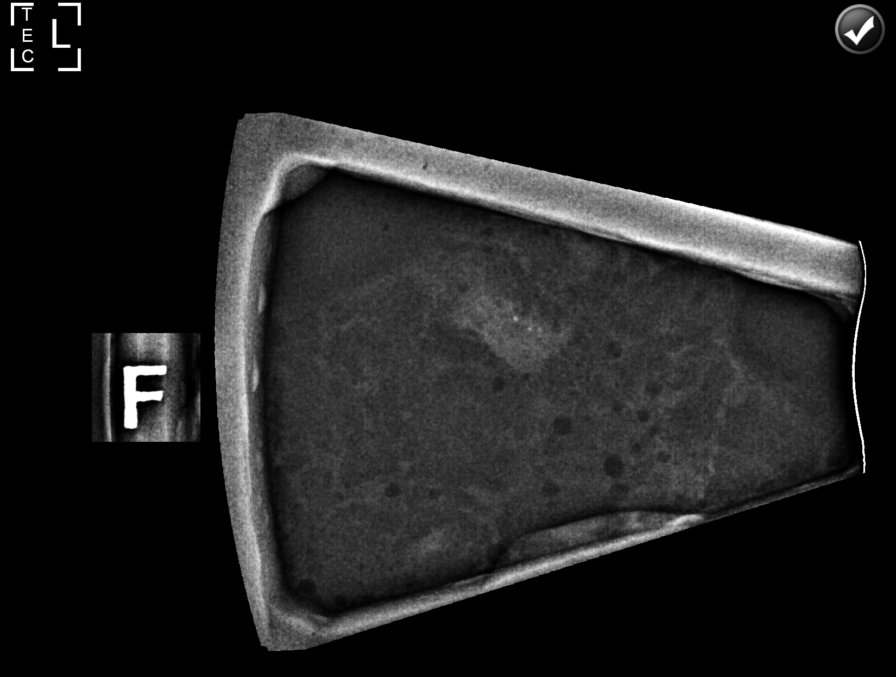

[L (3 of 6)]
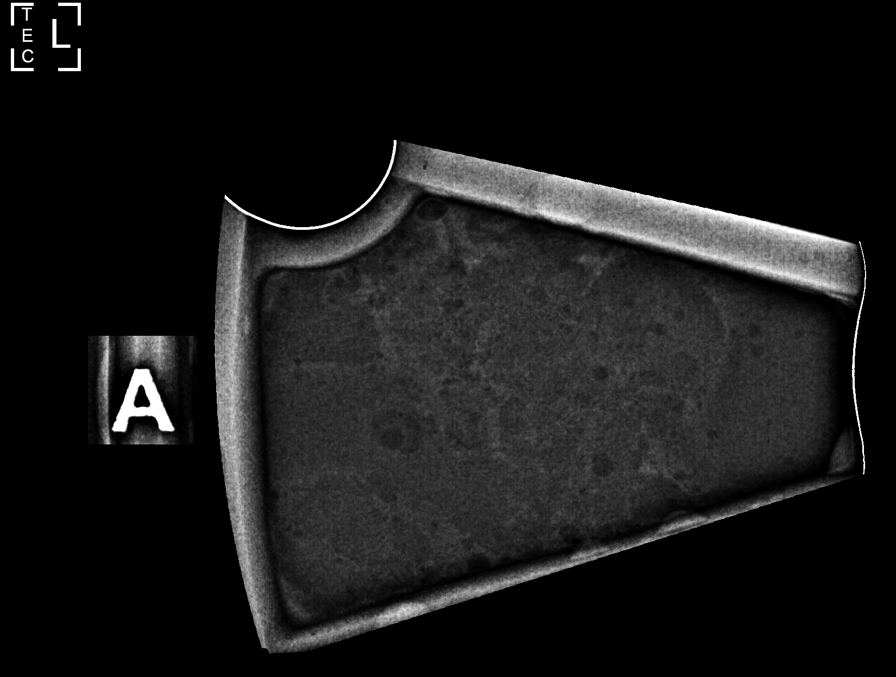

[L (4 of 6)]
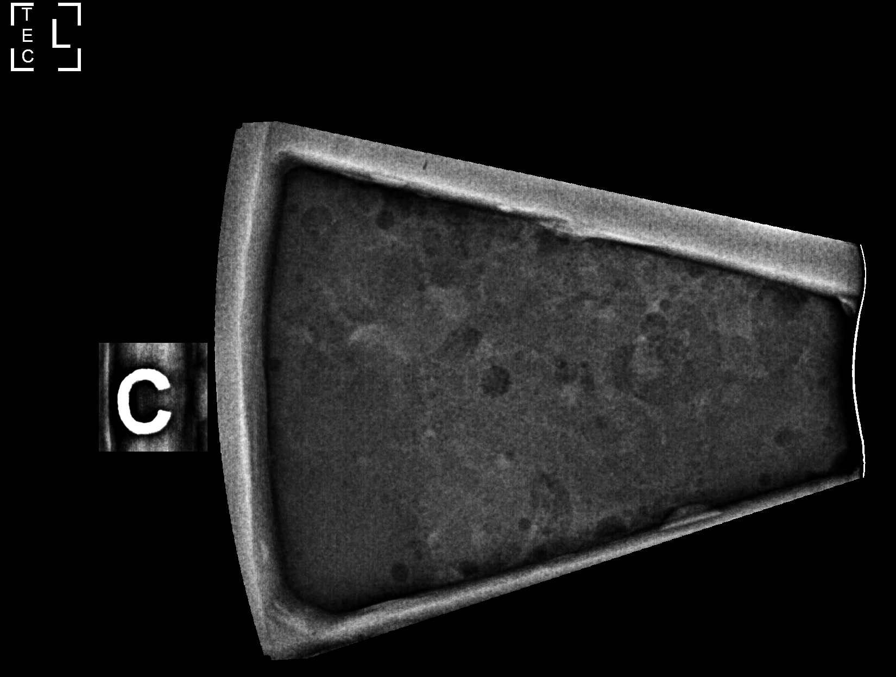

[L (5 of 6)]
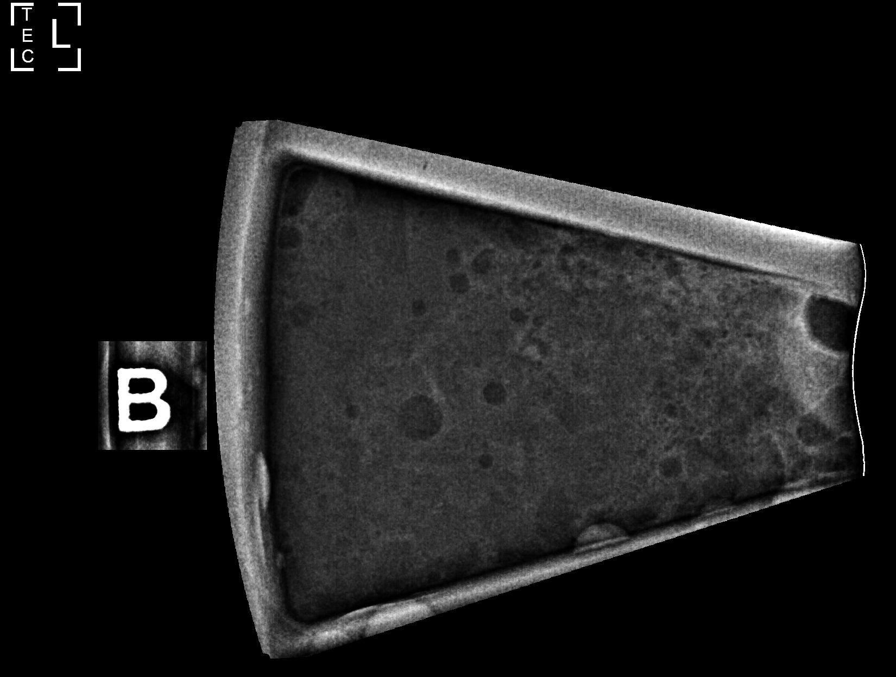

[L (6 of 6)]
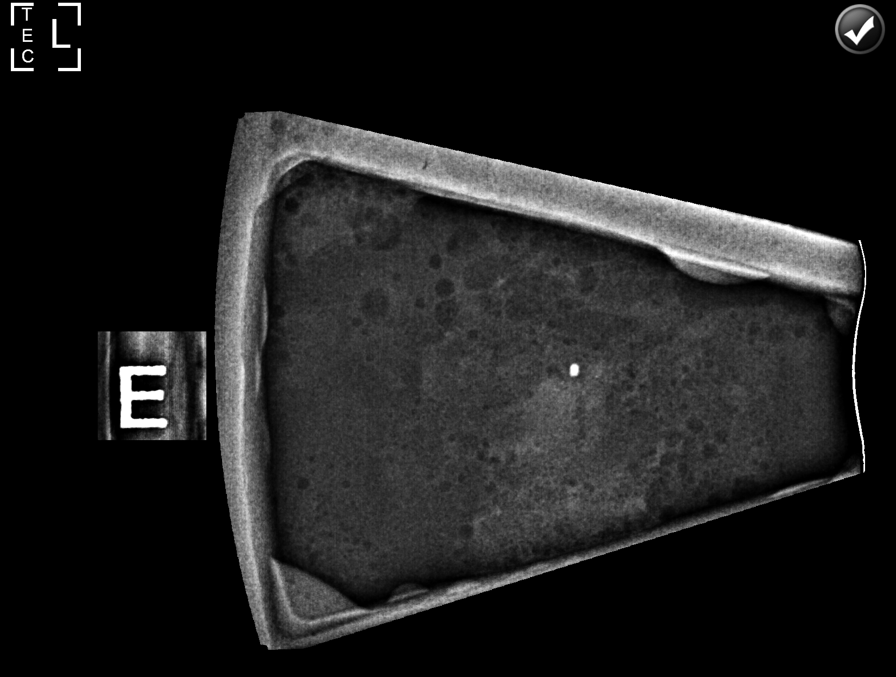

[6 of 19 positions shown; findings below may reference images not displayed]

Pretreatment MRI demonstrated non-mass enhancement extending
posteriorly from the biopsy site in the UPPER OUTER QUADRANT over an
approximate 9-10 cm length; the posterior extent of this non-mass
enhancement corresponds to a 3 cm group of calcifications on
mammography which are also sampled today.

EXAM:
LEFT BREAST STEREOTACTIC TOMOSYNTHESIS CORE NEEDLE BIOPSY

RIGHT BREAST STEREOTACTIC TOMOSYNTHESIS CORE NEEDLE BIOPSY
FINDINGS: The patient and I discussed the procedure of stereotactic-guided
biopsy including benefits and alternatives. We discussed the high
likelihood of a successful procedure. We discussed the risks of the
procedure including infection, bleeding, tissue injury, clip
migration, and inadequate sampling. Informed written consent was
given. The usual time out protocol was performed immediately prior
to the procedure.

# 1) LEFT breast, lesion quadrant: Upper central breast, near 12
o'clock location.

Using sterile technique with chlorhexidine as skin antisepsis, 1%
lidocaine and 1% lidocaine with epinephrine as local anesthetic,
under stereotactic tomosynthesis guidance, a 9 gauge Brevera vacuum
assisted device was used to perform core needle biopsy of the
asymmetry associated with calcifications in the upper central LEFT
breast using a superior approach. Specimen radiograph was performed
showing soft tissue density from the asymmetry in all 6 core samples
and calcifications in at least 2 of the core samples. Specimens with
calcifications are identified for pathology. At the conclusion of
the procedure, a ribbon shaped tissue marker clip was deployed into
the biopsy cavity.

# 2) RIGHT breast, lesion quadrant: UPPER OUTER QUADRANT.

Using sterile technique with chlorhexidine as skin antisepsis, 1%
lidocaine and 1% lidocaine with epinephrine as local anesthetic,
under stereotactic tomosynthesis guidance, a 9 gauge Brevera vacuum
assisted device was used perform core needle biopsy of the
calcifications in the UPPER OUTER QUADRANT of the RIGHT breast at
posterior depth using a superior approach. Specimen radiograph was
performed showing calcifications in at least 4 of the 6 core
samples. Specimens with calcifications are identified for pathology.
At the conclusion of procedure, a ribbon shaped tissue marker clip
was deployed into the biopsy cavity.

The patient tolerated the biopsy procedures well and there were no
apparent immediate complications. A follow-up 2-view mammogram was
performed and dictated separately.
IMPRESSION: 1. Stereotactic tomosynthesis core needle biopsy of an indeterminate
asymmetry associated with calcifications in the upper central LEFT
breast.
2. Stereotactic tomosynthesis core needle biopsy of a group of
calcifications in the UPPER OUTER QUADRANT of the RIGHT breast at
posterior depth (which correspond to the posterior extent of
non-mass enhancement on recent MRI).

ADDENDUM:
Pathology revealed USUAL DUCTAL HYPERPLASIA AND FIBROSIS- NO
DEFINITE ATYPIA IS IDENTIFIED of the LEFT breast, upper central
(ribbon clip). Note: the degree of fibrosis could correlate with
asymmetry. There are small calcifications but there are none large
enough to have been seen on mammograms. Additional deeper sections
were reviewed. This was found to be concordant by Dr. Tanu
Samsung.

Pathology revealed DUCTAL CARCINOMA IN SITU (DCIS), INTERMEDIATE
NUCLEAR GRADE WITH FOCAL NECROSIS, ASSOCIATED WITH CALCIFICATIONS of
the RIGHT breast, upper outer quadrant posterior depth (ribbon
clip). This was found to be concordant by Dr. Herbalufe Ceerciyev.

The patient reported doing well after the biopsies with tenderness
at the site on 05/20/2021. Post biopsy instructions and care were
reviewed and questions were answered. The patient was encouraged to
call [facility]
for any additional concerns. Pathology results were seen by patient
on [HOSPITAL] MYCHART. (We discussed this possibility on
05/20/2021). The patient was contacted and voicemail left on
05/23/2021 to discuss final results and recommendations.

The patient has a recent diagnosis of RIGHT breast cancer and should
follow her outlined treatment plan. Bilateral breast MRI recommended
in 6 months in order to follow-up the prior LEFT breast findings.

Pathology results reported by Eriek Badot RN on 05/23/2021.

*** End of Addendum ***
Pretreatment MRI demonstrated non-mass enhancement extending
posteriorly from the biopsy site in the UPPER OUTER QUADRANT over an
approximate 9-10 cm length; the posterior extent of this non-mass
enhancement corresponds to a 3 cm group of calcifications on
mammography which are also sampled today.

EXAM:
LEFT BREAST STEREOTACTIC TOMOSYNTHESIS CORE NEEDLE BIOPSY

RIGHT BREAST STEREOTACTIC TOMOSYNTHESIS CORE NEEDLE BIOPSY
FINDINGS: The patient and I discussed the procedure of stereotactic-guided
biopsy including benefits and alternatives. We discussed the high
likelihood of a successful procedure. We discussed the risks of the
procedure including infection, bleeding, tissue injury, clip
migration, and inadequate sampling. Informed written consent was
given. The usual time out protocol was performed immediately prior
to the procedure.

# 1) LEFT breast, lesion quadrant: Upper central breast, near 12
o'clock location.

Using sterile technique with chlorhexidine as skin antisepsis, 1%
lidocaine and 1% lidocaine with epinephrine as local anesthetic,
under stereotactic tomosynthesis guidance, a 9 gauge Brevera vacuum
assisted device was used to perform core needle biopsy of the
asymmetry associated with calcifications in the upper central LEFT
breast using a superior approach. Specimen radiograph was performed
showing soft tissue density from the asymmetry in all 6 core samples
and calcifications in at least 2 of the core samples. Specimens with
calcifications are identified for pathology. At the conclusion of
the procedure, a ribbon shaped tissue marker clip was deployed into
the biopsy cavity.

# 2) RIGHT breast, lesion quadrant: UPPER OUTER QUADRANT.

Using sterile technique with chlorhexidine as skin antisepsis, 1%
lidocaine and 1% lidocaine with epinephrine as local anesthetic,
under stereotactic tomosynthesis guidance, a 9 gauge Brevera vacuum
assisted device was used perform core needle biopsy of the
calcifications in the UPPER OUTER QUADRANT of the RIGHT breast at
posterior depth using a superior approach. Specimen radiograph was
performed showing calcifications in at least 4 of the 6 core
samples. Specimens with calcifications are identified for pathology.
At the conclusion of procedure, a ribbon shaped tissue marker clip
was deployed into the biopsy cavity.

The patient tolerated the biopsy procedures well and there were no
apparent immediate complications. A follow-up 2-view mammogram was
performed and dictated separately.
IMPRESSION: 1. Stereotactic tomosynthesis core needle biopsy of an indeterminate
asymmetry associated with calcifications in the upper central LEFT
breast.
2. Stereotactic tomosynthesis core needle biopsy of a group of
calcifications in the UPPER OUTER QUADRANT of the RIGHT breast at
posterior depth (which correspond to the posterior extent of
non-mass enhancement on recent MRI).

## 2023-03-23 ENCOUNTER — Encounter: Payer: Self-pay | Admitting: Nurse Practitioner

## 2023-03-23 DIAGNOSIS — M5459 Other low back pain: Secondary | ICD-10-CM | POA: Diagnosis not present

## 2023-03-23 DIAGNOSIS — R262 Difficulty in walking, not elsewhere classified: Secondary | ICD-10-CM | POA: Diagnosis not present

## 2023-03-30 DIAGNOSIS — M5459 Other low back pain: Secondary | ICD-10-CM | POA: Diagnosis not present

## 2023-03-30 DIAGNOSIS — R262 Difficulty in walking, not elsewhere classified: Secondary | ICD-10-CM | POA: Diagnosis not present

## 2023-04-02 ENCOUNTER — Encounter: Payer: Self-pay | Admitting: Nurse Practitioner

## 2023-04-03 ENCOUNTER — Other Ambulatory Visit: Payer: Self-pay

## 2023-04-03 ENCOUNTER — Other Ambulatory Visit: Payer: Self-pay | Admitting: Nurse Practitioner

## 2023-04-03 DIAGNOSIS — G4733 Obstructive sleep apnea (adult) (pediatric): Secondary | ICD-10-CM

## 2023-04-03 DIAGNOSIS — E119 Type 2 diabetes mellitus without complications: Secondary | ICD-10-CM

## 2023-04-03 DIAGNOSIS — E785 Hyperlipidemia, unspecified: Secondary | ICD-10-CM

## 2023-04-03 MED ORDER — ROSUVASTATIN CALCIUM 20 MG PO TABS: 20.0000 mg | ORAL_TABLET | Freq: Every day | ORAL | 3 refills | Status: DC

## 2023-04-03 MED ORDER — SEMAGLUTIDE (2 MG/DOSE) 8 MG/3ML ~~LOC~~ SOPN
2.0000 mg | PEN_INJECTOR | SUBCUTANEOUS | 11 refills | Status: DC
Start: 2023-04-03 — End: 2023-04-16

## 2023-04-03 MED ORDER — FLUTICASONE PROPIONATE 50 MCG/ACT NA SUSP
NASAL | 3 refills | Status: DC
Start: 2023-04-03 — End: 2023-08-13

## 2023-04-03 MED ORDER — OZEMPIC (1 MG/DOSE) 4 MG/3ML ~~LOC~~ SOPN: 1.0000 mg | PEN_INJECTOR | SUBCUTANEOUS | 0 refills | Status: DC

## 2023-04-03 NOTE — Telephone Encounter (Signed)
 Copied from CRM [id number]. Topic: Clinical - Medication Question >> Apr 03, 2023  2:30 PM Sim Boast F wrote: Reason for CRM: Patient request 90 day supply for the [facility] for her next refill

## 2023-04-03 NOTE — Telephone Encounter (Signed)
 Copied from CRM [id number]. Topic: Clinical - Medication Refill >> Apr 03, 2023  1:50 PM Pascal Lux wrote: Most Recent Primary Care Visit:  Provider: [facility]  Department: [facility]  Visit Type: LAB VISIT  Date: 03/20/2023  Medication: Semaglutide, 2 MG/DOSE, 8 MG/3ML SOPN [[id number]]  Has the patient contacted their pharmacy? Yes (Agent: If no, request that the patient contact the pharmacy for the refill. If patient does not wish to contact the pharmacy document the reason why and proceed with request.) (Agent: If yes, when and what did the pharmacy advise?) wrong strength and requesting 90 day supply.  Is this the correct pharmacy for this prescription? Yes If no, delete pharmacy and type the correct one.  This is the patient's preferred pharmacy:  [facility] [address], [place] - [address] [place] [address] Phone: [phone] Fax: [phone]   Has the prescription been filled recently? No  Is the patient out of the medication? No, 1 more shot  Has the patient been seen for an appointment in the last year OR does the patient have an upcoming appointment? Yes  Can we respond through MyChart? Yes  Agent: Please be advised that Rx refills may take up to 3 business days. We ask that you follow-up with your pharmacy.

## 2023-04-03 NOTE — Telephone Encounter (Signed)
 Please refill pt medication for 90 days supply.

## 2023-04-03 NOTE — Telephone Encounter (Signed)
 The medications the Patient requested has been sent in for 90 day supply.

## 2023-04-04 DIAGNOSIS — M5459 Other low back pain: Secondary | ICD-10-CM | POA: Diagnosis not present

## 2023-04-04 DIAGNOSIS — R262 Difficulty in walking, not elsewhere classified: Secondary | ICD-10-CM | POA: Diagnosis not present

## 2023-04-05 ENCOUNTER — Telehealth: Payer: Self-pay | Admitting: Pharmacy Technician

## 2023-04-05 ENCOUNTER — Encounter: Payer: Self-pay | Admitting: Nurse Practitioner

## 2023-04-05 ENCOUNTER — Ambulatory Visit: Admitting: Nurse Practitioner

## 2023-04-05 ENCOUNTER — Other Ambulatory Visit (HOSPITAL_COMMUNITY): Payer: Self-pay

## 2023-04-05 ENCOUNTER — Telehealth: Payer: Self-pay

## 2023-04-05 VITALS — BP 122/76 | HR 80 | Temp 97.4°F | Ht 63.0 in | Wt 321.6 lb

## 2023-04-05 DIAGNOSIS — R42 Dizziness and giddiness: Secondary | ICD-10-CM | POA: Diagnosis not present

## 2023-04-05 DIAGNOSIS — Z7985 Long-term (current) use of injectable non-insulin antidiabetic drugs: Secondary | ICD-10-CM | POA: Diagnosis not present

## 2023-04-05 DIAGNOSIS — E1169 Type 2 diabetes mellitus with other specified complication: Secondary | ICD-10-CM

## 2023-04-05 LAB — POC URINALSYSI DIPSTICK (AUTOMATED)
Bilirubin, UA: NEGATIVE
Blood, UA: NEGATIVE
Glucose, UA: NEGATIVE
Ketones, UA: NEGATIVE
Leukocytes, UA: NEGATIVE
Nitrite, UA: NEGATIVE
Protein, UA: NEGATIVE
Spec Grav, UA: 1.015 (ref 1.010–1.025)
Urobilinogen, UA: 0.2 U/dL
pH, UA: 6 (ref 5.0–8.0)

## 2023-04-05 MED ORDER — MECLIZINE HCL 12.5 MG PO TABS: 12.5000 mg | ORAL_TABLET | Freq: Two times a day (BID) | ORAL | 0 refills | Status: DC | PRN

## 2023-04-05 NOTE — Telephone Encounter (Signed)
 Pharmacy Patient Advocate Encounter   Received notification from CoverMyMeds that prior authorization for Ozempic 2 mg is required/requested.   Insurance verification completed.   The patient is insured through [facility] .   Per test claim: PA required; PA submitted to above mentioned insurance via CoverMyMeds Key/confirmation #[id number] Status is pending

## 2023-04-05 NOTE — Telephone Encounter (Signed)
 noted

## 2023-04-05 NOTE — Progress Notes (Signed)
 Established Patient Office Visit  Subjective:  Patient ID: Heather Cain, female    DOB: [birth year]  Age: 53 y.o. MRN: [id number]  CC:  Chief Complaint  Patient presents with   Acute Visit    Dizziness since Monday evening No racing heart but does feel like the dizziness she experienced when she had heart issues   Discussed the use of a AI scribe software for clinical note transcription with the patient, who gave verbal consent to proceed.  HPI  Heather Cain is a 53 year old female who presents with dizziness.  Dizziness began on Monday evening, characterized by spells occurring both at rest and with movement. The sensation is described as the room feeling 'off kilter' rather than spinning, sometimes accompanied by nausea. The dizziness has progressively worsened, with more frequent episodes, particularly noticeable upon getting up in the morning, causing a sensation of potential falling. No recent respiratory symptoms such as runny nose, cough, or postnasal drip.  No changes in vision, shortness of breath, or heart racing. She is taking a daily diuretic, which has improved swelling, and believes her water intake is normal.  Her blood sugar is well controlled Ozempic  2mg  weekly injections and glimepiride.  Lab Results  Component Value Date   HGBA1C 7.6 (H) 03/20/2023     Past Medical History:  Diagnosis Date   DCIS (ductal carcinoma in situ) of breast    Diabetes mellitus without complication (HCC)    Family history of bladder cancer    Family history of uterine cancer    High cholesterol    Neuromuscular disorder (HCC)    peripheral neuropathy   PONV (postoperative nausea and vomiting)    Vaginal Pap smear, abnormal     Past Surgical History:  Procedure Laterality Date   BREAST BIOPSY Right 03/08/2021   right breast bx coil clip -positive   BREAST BIOPSY Right 03/24/2021   stereo biopsy x clip/DUCTAL CARCINOMA IN SITU (DCIS),   BREAST BIOPSY Left 05/18/2021    ribbon clip   BREAST BIOPSY Right 05/18/2021   coil clip   COLONOSCOPY WITH PROPOFOL N/A 03/08/2022   Procedure: COLONOSCOPY WITH PROPOFOL;  Surgeon: Wyline Mood, MD;  Location: [facility];  Service: Gastroenterology;  Laterality: N/A;  REQUESTED LAST CASE   CRYOTHERAPY     EVACUATION BREAST HEMATOMA Right 06/16/2021   Procedure: EVACUATION HEMATOMA BREAST;  Surgeon: Campbell Lerner, MD;  Location: [facility];  Service: General;  Laterality: Right;   MASTECTOMY     SIMPLE MASTECTOMY WITH AXILLARY SENTINEL NODE BIOPSY Right 06/15/2021   Procedure: SIMPLE MASTECTOMY WITH AXILLARY SENTINEL NODE BIOPSY, Total;  Surgeon: Campbell Lerner, MD;  Location: [facility];  Service: General;  Laterality: Right;   TOTAL MASTECTOMY Left 06/15/2021   Procedure: TOTAL MASTECTOMY, prophylactic left;  Surgeon: Campbell Lerner, MD;  Location: [facility];  Service: General;  Laterality: Left;   WRIST SURGERY      Family History  Problem Relation Age of Onset   Uterine cancer Mother 16   Bladder Cancer Father 39   Skin cancer Maternal Aunt    Cancer - Other Maternal Grandfather        possible colon cancer   Breast cancer Maternal Great-grandmother     Social History   Socioeconomic History   Marital status: Single    Spouse name: Not on file   Number of children: Not on file   Years of education: Not on file   Highest education level: Some college, no degree  Occupational History   Not on file  Tobacco Use   Smoking status: Former    Current packs/day: 0.00    Types: Cigarettes    Quit date: 05/30/2021    Years since quitting: 1.8   Smokeless tobacco: Never  Vaping Use   Vaping status: Some Days  Substance and Sexual Activity   Alcohol use: Yes    Comment: social   Drug use: Yes    Types: Marijuana   Sexual activity: Not Currently    Birth control/protection: None  Other Topics Concern   Not on file  Social History Narrative   Lives in [place]; self; no children; 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  former Smoke ; rare alcohol.    Social Drivers of Health   Financial Resource Strain: Medium Risk (04/04/2023)   Overall Financial Resource Strain (CARDIA)    Difficulty of Paying Living Expenses: Somewhat hard  Food Insecurity: Food Insecurity Present (04/04/2023)   Hunger Vital Sign    Worried About Running Out of Food in the Last Year: Sometimes true    Ran Out of Food in the Last Year: Never true  Transportation Needs: No Transportation Needs (04/04/2023)   PRAPARE - Administrator, Civil Service (Medical): No    Lack of Transportation (Non-Medical): No  Physical Activity: Sufficiently Active (04/04/2023)   Exercise Vital Sign    Days of Exercise per Week: 3 days    Minutes of Exercise per Session: 60 min  Stress: Stress Concern Present (04/04/2023)   [employer] of Occupational Health - Occupational Stress Questionnaire    Feeling of Stress : To some extent  Social Connections: Socially Isolated (04/04/2023)   Social Connection and Isolation Panel [NHANES]    Frequency of Communication with Friends and Family: More than three times a week    Frequency of Social Gatherings with Friends and Family: Once a week    Attends Religious Services: Never    Database administrator or Organizations: No    Attends Engineer, structural: Not on file    Marital Status: Never married  Intimate Partner Violence: Not At Risk (03/16/2022)   Humiliation, Afraid, Rape, and Kick questionnaire    Fear of Current or Ex-Partner: No    Emotionally Abused: No    Physically Abused: No    Sexually Abused: No     Outpatient Medications Prior to Visit  Medication Sig Dispense Refill   ACCU-CHEK GUIDE TEST test strip USE AS DIRECTED 50 each 0   Accu-Chek Softclix Lancets lancets Use as instructed 100 each 4   albuterol (VENTOLIN HFA) 108 (90 Base) MCG/ACT inhaler Inhale 2 puffs into the lungs every 6 (six) hours as needed for wheezing or shortness of breath. 8 g 2   apixaban  (ELIQUIS) 5 MG TABS tablet Take 1 tablet by mouth twice daily 60 tablet 5   cholecalciferol (VITAMIN D3) 25 MCG (1000 UNIT) tablet Take by mouth daily.     fluticasone (FLONASE) 50 MCG/ACT nasal spray Use 2 spray(s) in each nostril once daily 16 g 3   furosemide (LASIX) 20 MG tablet Take 1 tablet (20 mg total) by mouth daily. (Patient taking differently: Take 20 mg by mouth 3 (three) times a week. Mon, Wed, Fri) 30 tablet 4   gabapentin (NEURONTIN) 100 MG capsule 200 mg 2 (two) times daily.     glimepiride (AMARYL) 4 MG tablet Take 1 tablet (4 mg total) by mouth daily before breakfast. 90 tablet 3   hydrocortisone 2.5 % cream  apply to face once a day Tues, Thurs, Sat 30 g 11   ketoconazole (NIZORAL) 2 % shampoo apply three times per week, massage into scalp and leave in for 10 minutes before rinsing out 120 mL 3   loratadine (CLARITIN) 10 MG tablet Take 10 mg by mouth daily.     mometasone (ELOCON) 0.1 % cream apply to legs 5 nights a week 45 g 6   rosuvastatin (CRESTOR) 20 MG tablet Take 1 tablet (20 mg total) by mouth daily. 90 tablet 3   Salicylic Acid (NIZORAL PSORIASIS SHAMPOO/COND) 3 % SHAM Apply topically.     metoprolol succinate (TOPROL-XL) 25 MG 24 hr tablet Take 1 tablet (25 mg total) by mouth at bedtime. 90 tablet 0   Semaglutide, 1 MG/DOSE, (OZEMPIC, 1 MG/DOSE,) 4 MG/3ML SOPN Inject 1 mg into the skin once a week. 9 mL 0   Semaglutide, 2 MG/DOSE, 8 MG/3ML SOPN Inject 2 mg as directed once a week. 3 mL 11   No facility-administered medications prior to visit.    Allergies  Allergen Reactions   Codeine Itching and Anaphylaxis    ROS Review of Systems  Neurological:  Positive for dizziness.   Negative unless indicated in HPI.    Objective:    Physical Exam Constitutional:      Appearance: Normal appearance.  Cardiovascular:     Rate and Rhythm: Normal rate and regular rhythm.     Heart sounds: Normal heart sounds.  Pulmonary:     Effort: Pulmonary effort is normal.      Breath sounds: No rhonchi or rales.  Neurological:     General: No focal deficit present.     Mental Status: She is alert and oriented to person, place, and time. Mental status is at baseline.     Cranial Nerves: No cranial nerve deficit.     Coordination: Coordination normal.     Gait: Gait normal.  Psychiatric:        Mood and Affect: Mood normal.        Behavior: Behavior normal.        Thought Content: Thought content normal.     BP 122/76   Pulse 80   Temp (!) 97.4 F (36.3 C)   Ht 5\' 3"  (1.6 m)   Wt (!) 321 lb 9.6 oz (145.9 kg)   SpO2 96%   BMI 56.97 kg/m  Wt Readings from Last 3 Encounters:  04/05/23 (!) 321 lb 9.6 oz (145.9 kg)  12/15/22 (!) 324 lb 12.8 oz (147.3 kg)  11/29/22 (!) 326 lb 6 oz (148 kg)     Health Maintenance  Topic Date Due   Pneumococcal Vaccine 51-33 Years old (1 of 2 - PCV) Never done   HIV Screening  Never done   Hepatitis C Screening  Never done   FOOT EXAM  07/23/2022   COVID-19 Vaccine (3 - Moderna risk series) 04/20/2023 (Originally 05/13/2019)   MAMMOGRAM  04/28/2023   Diabetic kidney evaluation - Urine ACR  06/12/2023   OPHTHALMOLOGY EXAM  08/04/2023   INFLUENZA VACCINE  08/10/2023   HEMOGLOBIN A1C  09/20/2023   Diabetic kidney evaluation - eGFR measurement  04/04/2024   Colonoscopy  03/08/2025   Cervical Cancer Screening (HPV/Pap Cotest)  05/05/2025   DTaP/Tdap/Td (3 - Td or Tdap) 02/11/2032   Zoster Vaccines- Shingrix  Completed   HPV VACCINES  Aged Out    There are no preventive care reminders to display for this patient.  Lab Results  Component Value  Date   TSH 1.550 12/15/2022   Lab Results  Component Value Date   WBC 5.1 12/15/2022   HGB 14.2 12/15/2022   HCT 42.8 12/15/2022   MCV 91 12/15/2022   PLT 190 12/15/2022   Lab Results  Component Value Date   NA 141 04/05/2023   K 4.3 04/05/2023   CO2 30 04/05/2023   GLUCOSE 100 (H) 04/05/2023   BUN 10 04/05/2023   CREATININE 0.88 04/05/2023   BILITOT 0.5  04/05/2023   ALKPHOS 77 04/05/2023   AST 29 04/05/2023   ALT 27 04/05/2023   PROT 7.4 04/05/2023   ALBUMIN 4.4 04/05/2023   CALCIUM 9.9 04/05/2023   ANIONGAP 11 11/15/2022   EGFR 91 12/15/2022   GFR 75.41 04/05/2023   Lab Results  Component Value Date   CHOL 109 12/15/2022   Lab Results  Component Value Date   HDL 35 (L) 12/15/2022   Lab Results  Component Value Date   LDLCALC 39 12/15/2022   Lab Results  Component Value Date   TRIG 222 (H) 12/15/2022   Lab Results  Component Value Date   CHOLHDL 3.1 12/15/2022   Lab Results  Component Value Date   HGBA1C 7.6 (H) 03/20/2023      Assessment & Plan:  Dizziness Assessment & Plan: Dizziness with tilting sensation. Normal neuro examination  - Prescribe meclizine for dizziness, discontinue if symptoms worsen. - Order blood tests to check electrolytes. - Advise to change positions slowly. - Recommend increasing fluid intake, including electrolyte-rich drinks.  Orders: -     POCT Urinalysis Dipstick (Automated) -     Comprehensive metabolic panel with GFR  Type 2 diabetes mellitus with other specified complication, without long-term current use of insulin (HCC) Assessment & Plan: Advised pt to monitor diet and exercise Advised pt to eat variety of food including fruits, vegetables, whole grains, complex carbohydrates and proteins.  Continue Ozempic 2 mg weekly.  Call continue glimepiride 4 mg daily.  Lab Results  Component Value Date   HGBA1C 7.6 (H) 03/20/2023       Other orders -     Meclizine HCl; Take 1 tablet (12.5 mg total) by mouth 2 (two) times daily as needed for dizziness.  Dispense: 30 tablet; Refill: 0    Follow-up: No follow-ups on file.   Kara Dies, NP

## 2023-04-05 NOTE — Telephone Encounter (Signed)
 Pharmacy Patient Advocate Encounter   Received notification from CoverMyMeds that prior authorization for Ozempic (1 MG/DOSE) 4MG /3ML pen-injectors is required/requested.   Insurance verification completed.   The patient is insured through [facility] .   Per test claim: [id number]  SUBMITTED AND PENDING

## 2023-04-06 ENCOUNTER — Encounter: Payer: Self-pay | Admitting: Nurse Practitioner

## 2023-04-06 DIAGNOSIS — R262 Difficulty in walking, not elsewhere classified: Secondary | ICD-10-CM | POA: Diagnosis not present

## 2023-04-06 DIAGNOSIS — M5459 Other low back pain: Secondary | ICD-10-CM | POA: Diagnosis not present

## 2023-04-06 LAB — COMPREHENSIVE METABOLIC PANEL WITH GFR
ALT: 27 U/L (ref 0–35)
AST: 29 U/L (ref 0–37)
Albumin: 4.4 g/dL (ref 3.5–5.2)
Alkaline Phosphatase: 77 U/L (ref 39–117)
BUN: 10 mg/dL (ref 6–23)
CO2: 30 meq/L (ref 19–32)
Calcium: 9.9 mg/dL (ref 8.4–10.5)
Chloride: 102 meq/L (ref 96–112)
Creatinine, Ser: 0.88 mg/dL (ref 0.40–1.20)
GFR: 75.41 mL/min (ref >60.00)
Glucose, Bld: 100 mg/dL — ABNORMAL HIGH (ref 70–99)
Potassium: 4.3 meq/L (ref 3.5–5.1)
Sodium: 141 meq/L (ref 135–145)
Total Bilirubin: 0.5 mg/dL (ref 0.2–1.2)
Total Protein: 7.4 g/dL (ref 6.0–8.3)

## 2023-04-06 NOTE — Telephone Encounter (Signed)
 PA request has been Submitted. New Encounter has been or will be created for follow up. For additional info see Pharmacy Prior Auth telephone encounter from 04-05-2023.

## 2023-04-06 NOTE — Telephone Encounter (Signed)
 Pharmacy Patient Advocate Encounter  Received notification from [facility] that Prior Authorization for Ozempic (1 MG/DOSE) 4MG /3ML pen-injectors has been DENIED.  No reason given; No denial letter received via Fax or CMM. It has been requested and will be uploaded to the media tab once received.   PA #/Case ID/Reference #: [id number]

## 2023-04-09 DIAGNOSIS — M5459 Other low back pain: Secondary | ICD-10-CM | POA: Diagnosis not present

## 2023-04-09 DIAGNOSIS — R262 Difficulty in walking, not elsewhere classified: Secondary | ICD-10-CM | POA: Diagnosis not present

## 2023-04-09 NOTE — Telephone Encounter (Signed)
 Noted.

## 2023-04-11 ENCOUNTER — Telehealth: Payer: Self-pay

## 2023-04-11 DIAGNOSIS — F4310 Post-traumatic stress disorder, unspecified: Secondary | ICD-10-CM | POA: Diagnosis not present

## 2023-04-11 NOTE — Telephone Encounter (Signed)
 Copied from CRM [id number]. Topic: Clinical - Prescription Issue >> Apr 11, 2023  1:08 PM [place] J wrote: Reason for CRM: Patient calling in regarding a needed prior authorization for semaglutide 2mg . I informed patient that the status is pending and we are waiting on our prior authorization team with an update. Patient was wondering if there is any way to expedite this process as she is now completely out of medication.

## 2023-04-12 ENCOUNTER — Other Ambulatory Visit (HOSPITAL_COMMUNITY): Payer: Self-pay

## 2023-04-12 DIAGNOSIS — M5459 Other low back pain: Secondary | ICD-10-CM | POA: Diagnosis not present

## 2023-04-12 DIAGNOSIS — R262 Difficulty in walking, not elsewhere classified: Secondary | ICD-10-CM | POA: Diagnosis not present

## 2023-04-12 NOTE — Telephone Encounter (Signed)
 Copied from CRM [id number]. Topic: Clinical - Prescription Issue >> Apr 12, 2023  3:37 PM Jon Gills C wrote: Reason for CRM: Patient called in regarding prescription Semaglutide, has went to the pharmacy and it still isnt ready for pickup would like for someone to call in regards to the prescription as she does not have anymore medication and is completely out

## 2023-04-15 ENCOUNTER — Other Ambulatory Visit: Payer: Self-pay | Admitting: Cardiology

## 2023-04-15 DIAGNOSIS — I480 Paroxysmal atrial fibrillation: Secondary | ICD-10-CM

## 2023-04-16 ENCOUNTER — Other Ambulatory Visit: Payer: Self-pay

## 2023-04-16 ENCOUNTER — Telehealth: Payer: Self-pay | Admitting: Nurse Practitioner

## 2023-04-16 ENCOUNTER — Encounter: Payer: Self-pay | Admitting: Nurse Practitioner

## 2023-04-16 DIAGNOSIS — E119 Type 2 diabetes mellitus without complications: Secondary | ICD-10-CM

## 2023-04-16 DIAGNOSIS — R42 Dizziness and giddiness: Secondary | ICD-10-CM | POA: Insufficient documentation

## 2023-04-16 DIAGNOSIS — M5459 Other low back pain: Secondary | ICD-10-CM | POA: Diagnosis not present

## 2023-04-16 DIAGNOSIS — R262 Difficulty in walking, not elsewhere classified: Secondary | ICD-10-CM | POA: Diagnosis not present

## 2023-04-16 MED ORDER — SEMAGLUTIDE (2 MG/DOSE) 8 MG/3ML ~~LOC~~ SOPN: 2.0000 mg | PEN_INJECTOR | SUBCUTANEOUS | 11 refills | Status: DC

## 2023-04-16 NOTE — Telephone Encounter (Signed)
 Prescription refill placed and sent the PA team a message letting them know that a PA is needed.

## 2023-04-16 NOTE — Telephone Encounter (Signed)
 Printed recent office note and last A1c and faxed to the PA team.

## 2023-04-16 NOTE — Assessment & Plan Note (Signed)
 Advised pt to monitor diet and exercise Advised pt to eat variety of food including fruits, vegetables, whole grains, complex carbohydrates and proteins.  Continue Ozempic 2 mg weekly.  Call continue glimepiride 4 mg daily.  Lab Results  Component Value Date   HGBA1C 7.6 (H) 03/20/2023

## 2023-04-16 NOTE — Telephone Encounter (Signed)
 Can you please send the recent office notes and A1c to the prior authorization team, so that it can be  submitted to the insurance. Thank you

## 2023-04-16 NOTE — Assessment & Plan Note (Signed)
 Dizziness with tilting sensation. Normal neuro examination  - Prescribe meclizine for dizziness, discontinue if symptoms worsen. - Order blood tests to check electrolytes. - Advise to change positions slowly. - Recommend increasing fluid intake, including electrolyte-rich drinks.

## 2023-04-16 NOTE — Telephone Encounter (Signed)
 Copied from CRM [id number]. Topic: Complaint (DO NOT CONVERT) - Care >> Apr 16, 2023 11:26 AM Elle L wrote: Date of Incident: The patient states that she has been dealing for this for two weeks.   Details of complaint: The patient states that she has been reaching out daily for an update on her Semaglutide Prior Authorization request and she feels that she has not been properly cared for as she is out of the medication and it is not ready for pick up.   How would the patient like to see it resolved? The patient would like her medication today.   On a scale of 1-10, how was your experience? 0.   What would it take to bring it to a 10? She wants her medication today and a call back at [phone].   Route to Research officer, political party.

## 2023-04-17 ENCOUNTER — Other Ambulatory Visit (HOSPITAL_COMMUNITY)
Admission: RE | Admit: 2023-04-17 | Discharge: 2023-04-17 | Disposition: A | Discharge status: Home / Self Care | Source: Ambulatory Visit | Attending: Certified Nurse Midwife | Admitting: Certified Nurse Midwife

## 2023-04-17 ENCOUNTER — Encounter: Payer: Self-pay | Admitting: Certified Nurse Midwife

## 2023-04-17 ENCOUNTER — Telehealth: Payer: Self-pay

## 2023-04-17 ENCOUNTER — Telehealth: Payer: Self-pay | Admitting: Pharmacy Technician

## 2023-04-17 ENCOUNTER — Other Ambulatory Visit (HOSPITAL_COMMUNITY): Payer: Self-pay

## 2023-04-17 ENCOUNTER — Ambulatory Visit (INDEPENDENT_AMBULATORY_CARE_PROVIDER_SITE_OTHER): Payer: Medicaid Other | Admitting: Certified Nurse Midwife

## 2023-04-17 VITALS — BP 97/61 | HR 72 | Ht 63.0 in | Wt 322.8 lb

## 2023-04-17 DIAGNOSIS — Z1151 Encounter for screening for human papillomavirus (HPV): Secondary | ICD-10-CM | POA: Diagnosis not present

## 2023-04-17 DIAGNOSIS — Z01419 Encounter for gynecological examination (general) (routine) without abnormal findings: Secondary | ICD-10-CM | POA: Insufficient documentation

## 2023-04-17 DIAGNOSIS — Z124 Encounter for screening for malignant neoplasm of cervix: Secondary | ICD-10-CM

## 2023-04-17 NOTE — Telephone Encounter (Signed)
error 

## 2023-04-17 NOTE — Telephone Encounter (Signed)
 Pharmacy Patient Advocate Encounter  Received notification from [facility] that Prior Authorization for Ozempic (2 MG/DOSE) 8MG /3ML pen-injectors has been APPROVED from 04/17/23 to 04/16/24. Ran test claim, Copay is $4.00. This test claim was processed through [facility]- copay amounts may vary at other pharmacies due to pharmacy/plan contracts, or as the patient moves through the different stages of their insurance plan.   PA #/Case ID/Reference #: [id number]

## 2023-04-17 NOTE — Progress Notes (Deleted)
 ANNUAL EXAM Patient name: Heather Cain MRN [id number]  Date of birth: 27-Jun-1970 Chief Complaint:   New Patient (Initial Visit) and Annual Exam  History of Present Illness:   Heather Cain is a 53 y.o. G57P0010 {race:25618} female being seen today for a routine annual exam.  Current complaints: ***  Patient's last menstrual period was 02/06/2023 (exact date).   The pregnancy intention screening data noted above was reviewed. Potential methods of contraception were discussed. The patient elected to proceed with No data recorded.      Component Value Date/Time   DIAGPAP (A) 05/05/2020 1523    - Atypical squamous cells of undetermined significance (ASC-US)   HPVHIGH Negative 05/05/2020 1523   ADEQPAP  05/05/2020 1523    Satisfactory for evaluation; transformation zone component PRESENT.       Last pap ***. Results were: {Pap findings:25134}. H/O abnormal pap: {yes/yes***/no:23866} Last mammogram: ***. Results were: {normal, abnormal, n/a:23837}. Family h/o breast cancer: {yes***/no:23838} Last colonoscopy: ***. Results were: {normal, abnormal, n/a:23837}. Family h/o colorectal cancer: {yes***/no:23838}     04/05/2023    2:36 PM 12/15/2022    1:25 PM 08/01/2022    3:21 PM 06/22/2022    1:45 PM 04/27/2022    2:07 PM  Depression screen PHQ 2/9  Decreased Interest 0 1 1 0 0  Down, Depressed, Hopeless 0 1 1 0 0  PHQ - 2 Score 0 2 2 0 0  Altered sleeping 0 0 0 0 3  Tired, decreased energy 1 0 1 0 3  Change in appetite 1 0 1 0 0  Feeling bad or failure about yourself  1 1 1  0 0  Trouble concentrating 0 0 0 0 0  Moving slowly or fidgety/restless 0 0 0 0 0  Suicidal thoughts 0 0 0 0 0  PHQ-9 Score 3 3 5  0 6  Difficult doing work/chores Somewhat difficult Somewhat difficult Somewhat difficult Not difficult at all Not difficult at all        04/05/2023    2:37 PM 12/15/2022    1:25 PM 08/01/2022    3:22 PM 06/22/2022    1:45 PM  GAD 7 : Generalized Anxiety Score  Nervous,  Anxious, on Edge 1 2 1 2   Control/stop worrying 0 2 1 2   Worry too much - different things 0 2 1 2   Trouble relaxing 0 2 1 2   Restless 0 0 0 0  Easily annoyed or irritable 0 2 0 0  Afraid - awful might happen 0 0 1 0  Total GAD 7 Score 1 10 5 8   Anxiety Difficulty Somewhat difficult Somewhat difficult Somewhat difficult Not difficult at all     Review of Systems:   Pertinent items are noted in HPI Denies any headaches, blurred vision, fatigue, shortness of breath, chest pain, abdominal pain, abnormal vaginal discharge/itching/odor/irritation, problems with periods, bowel movements, urination, or intercourse unless otherwise stated above. Pertinent History Reviewed:  Reviewed past medical,surgical, social and family history.  Reviewed problem list, medications and allergies. Physical Assessment:   Vitals:   04/17/23 1353  BP: 97/61  Pulse: 72  Weight: (!) 322 lb 12.8 oz (146.4 kg)  Height: 5\' 3"  (1.6 m)  Body mass index is 57.18 kg/m.       Physical Exam   No results found for this or any previous visit (from the past 24 hours).  Assessment & Plan:  1. Encounter for screening for human papillomavirus (HPV) (Primary) - Cytology - PAP ([place])  2.  Well woman exam - Cytology - PAP ([place])  3. Screening for malignant neoplasm of cervix - Cytology - PAP ([place])  Mammogram: {Mammo f/u:25212}, or sooner if problems Colonoscopy: {TCS f/u:25213}, or sooner if problems  No orders of the defined types were placed in this encounter.   Meds: No orders of the defined types were placed in this encounter.   Follow-up: Return in 1 year (on 04/16/2024).  Dominica Severin, CNM 04/17/2023 2:54 PM

## 2023-04-17 NOTE — Telephone Encounter (Signed)
 Noted.

## 2023-04-17 NOTE — Telephone Encounter (Signed)
 Pharmacy Patient Advocate Encounter   Received notification from [employer] that prior authorization for Ozempic (2 MG/DOSE) 8MG /3ML pen-injectors is required/requested.   Insurance verification completed.   The patient is insured through [facility] .   Per test claim: [id number]  Resubmitted this morning with additional information

## 2023-04-17 NOTE — Telephone Encounter (Signed)
 Good morning I resent the PA just now with additional information it was denied and the denial letter was uploaded to the pt's media tab dated 04/05/23 but I did resend this morning and opened a new encounter for the PA this morning

## 2023-04-17 NOTE — Telephone Encounter (Signed)
 Called Patient to let her know that a PA has been submitted for the Ozempic 2 mg and that are waiting on a response so I will be back in touch with her when I get a response.

## 2023-04-17 NOTE — Telephone Encounter (Signed)
 PA request has been Submitted. New Encounter has been or will be created for follow up. For additional info see Pharmacy Prior Auth telephone encounter from 04/17/23.

## 2023-04-17 NOTE — Progress Notes (Unsigned)
 ANNUAL EXAM Patient name: Heather Cain MRN [id number]  Date of birth: May 17, 1970 Chief Complaint:   New Patient (Initial Visit) and Annual Exam  History of Present Illness:   Heather Cain is a 53 y.o. G19P0010 {race:25618} female being seen today for a routine annual exam.  Current complaints: ***  Patient's last menstrual period was 02/06/2023 (exact date).   The pregnancy intention screening data noted above was reviewed. Potential methods of contraception were discussed. The patient elected to proceed with No data recorded.      Component Value Date/Time   DIAGPAP (A) 05/05/2020 1523    - Atypical squamous cells of undetermined significance (ASC-US)   HPVHIGH Negative 05/05/2020 1523   ADEQPAP  05/05/2020 1523    Satisfactory for evaluation; transformation zone component PRESENT.       Last pap ***. Results were: {Pap findings:25134}. H/O abnormal pap: {yes/yes***/no:23866} Last mammogram: ***. Results were: {normal, abnormal, n/a:23837}. Family h/o breast cancer: {yes***/no:23838} Last colonoscopy: ***. Results were: {normal, abnormal, n/a:23837}. Family h/o colorectal cancer: {yes***/no:23838}     04/05/2023    2:36 PM 12/15/2022    1:25 PM 08/01/2022    3:21 PM 06/22/2022    1:45 PM 04/27/2022    2:07 PM  Depression screen PHQ 2/9  Decreased Interest 0 1 1 0 0  Down, Depressed, Hopeless 0 1 1 0 0  PHQ - 2 Score 0 2 2 0 0  Altered sleeping 0 0 0 0 3  Tired, decreased energy 1 0 1 0 3  Change in appetite 1 0 1 0 0  Feeling bad or failure about yourself  1 1 1  0 0  Trouble concentrating 0 0 0 0 0  Moving slowly or fidgety/restless 0 0 0 0 0  Suicidal thoughts 0 0 0 0 0  PHQ-9 Score 3 3 5  0 6  Difficult doing work/chores Somewhat difficult Somewhat difficult Somewhat difficult Not difficult at all Not difficult at all        04/05/2023    2:37 PM 12/15/2022    1:25 PM 08/01/2022    3:22 PM 06/22/2022    1:45 PM  GAD 7 : Generalized Anxiety Score  Nervous,  Anxious, on Edge 1 2 1 2   Control/stop worrying 0 2 1 2   Worry too much - different things 0 2 1 2   Trouble relaxing 0 2 1 2   Restless 0 0 0 0  Easily annoyed or irritable 0 2 0 0  Afraid - awful might happen 0 0 1 0  Total GAD 7 Score 1 10 5 8   Anxiety Difficulty Somewhat difficult Somewhat difficult Somewhat difficult Not difficult at all     Review of Systems:   Pertinent items are noted in HPI Denies any headaches, blurred vision, fatigue, shortness of breath, chest pain, abdominal pain, abnormal vaginal discharge/itching/odor/irritation, problems with periods, bowel movements, urination, or intercourse unless otherwise stated above. Pertinent History Reviewed:  Reviewed past medical,surgical, social and family history.  Reviewed problem list, medications and allergies. Physical Assessment:   Vitals:   04/17/23 1353  BP: 97/61  Pulse: 72  Weight: (!) 322 lb 12.8 oz (146.4 kg)  Height: 5\' 3"  (1.6 m)  Body mass index is 57.18 kg/m.       Physical Exam   No results found for this or any previous visit (from the past 24 hours).  Assessment & Plan:  There are no diagnoses linked to this encounter.  Mammogram: {Mammo f/u:25212}, or sooner if problems  Colonoscopy: {TCS f/u:25213}, or sooner if problems  No orders of the defined types were placed in this encounter.   Meds: No orders of the defined types were placed in this encounter.   Follow-up: No follow-ups on file.  Dominica Severin, CNM 04/17/2023 2:23 PM

## 2023-04-18 DIAGNOSIS — F4310 Post-traumatic stress disorder, unspecified: Secondary | ICD-10-CM | POA: Diagnosis not present

## 2023-04-19 DIAGNOSIS — R262 Difficulty in walking, not elsewhere classified: Secondary | ICD-10-CM | POA: Diagnosis not present

## 2023-04-19 DIAGNOSIS — M5459 Other low back pain: Secondary | ICD-10-CM | POA: Diagnosis not present

## 2023-04-20 LAB — CYTOLOGY - PAP
Comment: NEGATIVE
Diagnosis: NEGATIVE
High risk HPV: NEGATIVE

## 2023-04-22 ENCOUNTER — Encounter: Payer: Self-pay | Admitting: Certified Nurse Midwife

## 2023-04-23 DIAGNOSIS — F411 Generalized anxiety disorder: Secondary | ICD-10-CM | POA: Diagnosis not present

## 2023-04-25 DIAGNOSIS — R262 Difficulty in walking, not elsewhere classified: Secondary | ICD-10-CM | POA: Diagnosis not present

## 2023-04-25 DIAGNOSIS — M5459 Other low back pain: Secondary | ICD-10-CM | POA: Diagnosis not present

## 2023-04-30 ENCOUNTER — Other Ambulatory Visit: Payer: Self-pay | Admitting: Nurse Practitioner

## 2023-04-30 DIAGNOSIS — M5459 Other low back pain: Secondary | ICD-10-CM | POA: Diagnosis not present

## 2023-04-30 DIAGNOSIS — R262 Difficulty in walking, not elsewhere classified: Secondary | ICD-10-CM | POA: Diagnosis not present

## 2023-04-30 DIAGNOSIS — E119 Type 2 diabetes mellitus without complications: Secondary | ICD-10-CM

## 2023-05-03 ENCOUNTER — Ambulatory Visit: Payer: Medicaid Other | Admitting: Dermatology

## 2023-05-07 DIAGNOSIS — M5459 Other low back pain: Secondary | ICD-10-CM | POA: Diagnosis not present

## 2023-05-07 DIAGNOSIS — R262 Difficulty in walking, not elsewhere classified: Secondary | ICD-10-CM | POA: Diagnosis not present

## 2023-05-10 DIAGNOSIS — R262 Difficulty in walking, not elsewhere classified: Secondary | ICD-10-CM | POA: Diagnosis not present

## 2023-05-10 DIAGNOSIS — M5459 Other low back pain: Secondary | ICD-10-CM | POA: Diagnosis not present

## 2023-05-14 DIAGNOSIS — R262 Difficulty in walking, not elsewhere classified: Secondary | ICD-10-CM | POA: Diagnosis not present

## 2023-05-14 DIAGNOSIS — M5459 Other low back pain: Secondary | ICD-10-CM | POA: Diagnosis not present

## 2023-05-15 DIAGNOSIS — F411 Generalized anxiety disorder: Secondary | ICD-10-CM | POA: Diagnosis not present

## 2023-05-17 DIAGNOSIS — M5459 Other low back pain: Secondary | ICD-10-CM | POA: Diagnosis not present

## 2023-05-17 DIAGNOSIS — R262 Difficulty in walking, not elsewhere classified: Secondary | ICD-10-CM | POA: Diagnosis not present

## 2023-05-21 ENCOUNTER — Encounter: Payer: Self-pay | Admitting: Dermatology

## 2023-05-21 ENCOUNTER — Ambulatory Visit: Admitting: Dermatology

## 2023-05-21 DIAGNOSIS — L219 Seborrheic dermatitis, unspecified: Secondary | ICD-10-CM | POA: Diagnosis not present

## 2023-05-21 DIAGNOSIS — Z7189 Other specified counseling: Secondary | ICD-10-CM

## 2023-05-21 DIAGNOSIS — B079 Viral wart, unspecified: Secondary | ICD-10-CM

## 2023-05-21 DIAGNOSIS — D229 Melanocytic nevi, unspecified: Secondary | ICD-10-CM

## 2023-05-21 DIAGNOSIS — D1801 Hemangioma of skin and subcutaneous tissue: Secondary | ICD-10-CM | POA: Diagnosis not present

## 2023-05-21 DIAGNOSIS — L817 Pigmented purpuric dermatosis: Secondary | ICD-10-CM

## 2023-05-21 DIAGNOSIS — W908XXA Exposure to other nonionizing radiation, initial encounter: Secondary | ICD-10-CM

## 2023-05-21 DIAGNOSIS — I872 Venous insufficiency (chronic) (peripheral): Secondary | ICD-10-CM

## 2023-05-21 DIAGNOSIS — L814 Other melanin hyperpigmentation: Secondary | ICD-10-CM

## 2023-05-21 DIAGNOSIS — L729 Follicular cyst of the skin and subcutaneous tissue, unspecified: Secondary | ICD-10-CM

## 2023-05-21 DIAGNOSIS — L578 Other skin changes due to chronic exposure to nonionizing radiation: Secondary | ICD-10-CM | POA: Diagnosis not present

## 2023-05-21 DIAGNOSIS — L720 Epidermal cyst: Secondary | ICD-10-CM | POA: Diagnosis not present

## 2023-05-21 DIAGNOSIS — L820 Inflamed seborrheic keratosis: Secondary | ICD-10-CM

## 2023-05-21 DIAGNOSIS — L821 Other seborrheic keratosis: Secondary | ICD-10-CM | POA: Diagnosis not present

## 2023-05-21 DIAGNOSIS — Z1283 Encounter for screening for malignant neoplasm of skin: Secondary | ICD-10-CM

## 2023-05-21 DIAGNOSIS — Z79899 Other long term (current) drug therapy: Secondary | ICD-10-CM

## 2023-05-21 MED ORDER — VASCULERA PO TABS: ORAL_TABLET | ORAL | 6 refills | Status: AC

## 2023-05-21 NOTE — Patient Instructions (Addendum)
 Start prescription 5-fluorouracil/salicylic acid wart paste from [facility] nightly under occlusion. Reviewed risk of irritation and risk scarring if applied to normal skin. If irritation develops, stop medication for a few days until area calm, then restart a very small amount just to the wart. This medication cannot be used by pregnant women. Patient advised they will receive an email from the [facility] pharmacy and can purchase the medication online through a link in the email.    Instructions for [facility] Medications  One or more of your medications was sent to the [facility] mail order compounding pharmacy. You will receive an email from them and can purchase the medicine through that link. It will then be mailed to your home at the address you confirmed. If for any reason you do not receive an email from them, please check your spam folder. If you still do not find the email, please let us  know. [facility] phone number is [phone].        Pre-Operative Instructions  You are scheduled for a surgical procedure at [facility]. We recommend you read the following instructions. If you have any questions or concerns, please call the office at [phone].  Shower and wash the entire body with soap and water  the day of your surgery paying special attention to cleansing at and around the planned surgery site.  Avoid aspirin or aspirin containing products at least fourteen (14) days prior to your surgical procedure and for at least one week (7 Days) after your surgical procedure. If you take aspirin on a regular basis for heart disease or history of stroke or for any other reason, we may recommend you continue taking aspirin but please notify us  if you take this on a regular basis. Aspirin can cause more bleeding to occur during surgery as well as prolonged bleeding and bruising after surgery.   Avoid other nonsteroidal pain medications at least one week  prior to surgery and at least one week prior to your surgery. These include medications such as Ibuprofen (Motrin, Advil and Nuprin), Naprosyn, Voltaren, Relafen, etc. If medications are used for therapeutic reasons, please inform us  as they can cause increased bleeding or prolonged bleeding during and bruising after surgical procedures.   Please advise us  if you are taking any "blood thinner" medications such as Coumadin or Dipyridamole or Plavix or similar medications. These cause increased bleeding and prolonged bleeding during procedures and bruising after surgical procedures. We may have to consider discontinuing these medications briefly prior to and shortly after your surgery if safe to do so.   Please inform us  of all medications you are currently taking. All medications that are taken regularly should be taken the day of surgery as you always do. Nevertheless, we need to be informed of what medications you are taking prior to surgery to know whether they will affect the procedure or cause any complications.   Please inform us  of any medication allergies. Also inform us  of whether you have allergies to Latex or rubber products or whether you have had any adverse reaction to Lidocaine  or Epinephrine .  Please inform us  of any prosthetic or artificial body parts such as artificial heart valve, joint replacements, etc., or similar condition that might require preoperative antibiotics.   We recommend avoidance of alcohol at least two weeks prior to surgery and continued avoidance for at least two weeks after surgery.   We recommend discontinuation of tobacco smoking at least two weeks prior to surgery and continued  abstinence for at least two weeks after surgery.  Do not plan strenuous exercise, strenuous work or strenuous lifting for approximately four weeks after your surgery.   We request if you are unable to make your scheduled surgical appointment, please call us  at least a week in advance or  as soon as you are aware of a problem so that we can cancel or reschedule the appointment.   You MAY TAKE TYLENOL  (acetaminophen ) for pain as it is not a blood thinner.   PLEASE PLAN TO BE IN TOWN FOR TWO WEEKS FOLLOWING SURGERY, THIS IS IMPORTANT SO YOU CAN BE CHECKED FOR DRESSING CHANGES, SUTURE REMOVAL AND TO MONITOR FOR POSSIBLE COMPLICATIONS.   For Venous stasis dermatitis at lower legs and extremities  Stasis in the legs causes chronic leg swelling, which may result in itchy or painful rashes, skin discoloration, skin texture changes, and sometimes ulceration.  Recommend daily graduated compression hose/stockings- easiest to put on first thing in morning, remove at bedtime.  Elevate legs as much as possible. Avoid salt/sodium rich foods.   Can start samples given (vasculera) take 1 tab by mouth daily    Seborrheic Keratosis  What causes seborrheic keratoses? Seborrheic keratoses are harmless, common skin growths that first appear during adult life.  As time goes by, more growths appear.  Some people may develop a large number of them.  Seborrheic keratoses appear on both covered and uncovered body parts.  They are not caused by sunlight.  The tendency to develop seborrheic keratoses can be inherited.  They vary in color from skin-colored to gray, brown, or even black.  They can be either smooth or have a rough, warty surface.   Seborrheic keratoses are superficial and look as if they were stuck on the skin.  Under the microscope this type of keratosis looks like layers upon layers of skin.  That is why at times the top layer may seem to fall off, but the rest of the growth remains and re-grows.    Treatment Seborrheic keratoses do not need to be treated, but can easily be removed in the office.  Seborrheic keratoses often cause symptoms when they rub on clothing or jewelry.  Lesions can be in the way of shaving.  If they become inflamed, they can cause itching, soreness, or burning.  Removal  of a seborrheic keratosis can be accomplished by freezing, burning, or surgery. If any spot bleeds, scabs, or grows rapidly, please return to have it checked, as these can be an indication of a skin cancer.   Cryotherapy Aftercare  Wash gently with soap and water  everyday.   Apply Vaseline and Band-Aid daily until healed.     Melanoma ABCDEs  Melanoma is the most dangerous type of skin cancer, and is the leading cause of death from skin disease.  You are more likely to develop melanoma if you: Have light-colored skin, light-colored eyes, or red or blond hair Spend a lot of time in the sun Tan regularly, either outdoors or in a tanning bed Have had blistering sunburns, especially during childhood Have a close family member who has had a melanoma Have atypical moles or large birthmarks  Early detection of melanoma is key since treatment is typically straightforward and cure rates are extremely high if we catch it early.   The first sign of melanoma is often a change in a mole or a new dark spot.  The ABCDE system is a way of remembering the signs of melanoma.  A for  asymmetry:  The two halves do not match. B for border:  The edges of the growth are irregular. C for color:  A mixture of colors are present instead of an even brown color. D for diameter:  Melanomas are usually (but not always) greater than 6mm - the size of a pencil eraser. E for evolution:  The spot keeps changing in size, shape, and color.  Please check your skin once per month between visits. You can use a small mirror in front and a large mirror behind you to keep an eye on the back side or your body.   If you see any new or changing lesions before your next follow-up, please call to schedule a visit.  Please continue daily skin protection including broad spectrum sunscreen SPF 30+ to sun-exposed areas, reapplying every 2 hours as needed when you're outdoors.   Staying in the shade or wearing long sleeves, sun  glasses (UVA+UVB protection) and wide brim hats (4-inch brim around the entire circumference of the hat) are also recommended for sun protection.    Due to recent changes in healthcare laws, you may see results of your pathology and/or laboratory studies on MyChart before the doctors have had a chance to review them. We understand that in some cases there may be results that are confusing or concerning to you. Please understand that not all results are received at the same time and often the doctors may need to interpret multiple results in order to provide you with the best plan of care or course of treatment. Therefore, we ask that you please give us  2 business days to thoroughly review all your results before contacting the office for clarification. Should we see a critical lab result, you will be contacted sooner.   If You Need Anything After Your Visit  If you have any questions or concerns for your doctor, please call our main line at [phone] and press option 4 to reach your doctor's medical assistant. If no one answers, please leave a voicemail as directed and we will return your call as soon as possible. Messages left after 4 pm will be answered the following business day.   You may also send us  a message via MyChart. We typically respond to MyChart messages within 1-2 business days.  For prescription refills, please ask your pharmacy to contact our office. Our fax number is [phone].  If you have an urgent issue when the clinic is closed that cannot wait until the next business day, you can page your doctor at the number below.    Please note that while we do our best to be available for urgent issues outside of office hours, we are not available 24/7.   If you have an urgent issue and are unable to reach us , you may choose to seek medical care at your doctor's office, retail clinic, urgent care center, or emergency room.  If you have a medical emergency, please immediately call  911 or go to the emergency department.  Pager Numbers  - Dr. Bary Likes: [phone]  - Dr. Annette Barters: [phone]  - Dr. Felipe Horton: [phone]   In the event of inclement weather, please call our main line at [phone] for an update on the status of any delays or closures.  Dermatology Medication Tips: Please keep the boxes that topical medications come in in order to help keep track of the instructions about where and how to use these. Pharmacies typically print the medication instructions only on the boxes and not  directly on the medication tubes.   If your medication is too expensive, please contact our office at [phone] option 4 or send us  a message through MyChart.   We are unable to tell what your co-pay for medications will be in advance as this is different depending on your insurance coverage. However, we may be able to find a substitute medication at lower cost or fill out paperwork to get insurance to cover a needed medication.   If a prior authorization is required to get your medication covered by your insurance company, please allow us  1-2 business days to complete this process.  Drug prices often vary depending on where the prescription is filled and some pharmacies may offer cheaper prices.  The website www.goodrx.com contains coupons for medications through different pharmacies. The prices here do not account for what the cost may be with help from insurance (it may be cheaper with your insurance), but the website can give you the price if you did not use any insurance.  - You can print the associated coupon and take it with your prescription to the pharmacy.  - You may also stop by our office during regular business hours and pick up a GoodRx coupon card.  - If you need your prescription sent electronically to a different pharmacy, notify our office through [facility] or by phone at [phone] option 4.     Si Usted Necesita Algo Despus de Su  Visita  Tambin puede enviarnos un mensaje a travs de Clinical cytogeneticist. Por lo general respondemos a los mensajes de MyChart en el transcurso de 1 a 2 das hbiles.  Para renovar recetas, por favor pida a su farmacia que se ponga en contacto con nuestra oficina. Franz Jacks de fax es [place] [phone].  Si tiene un asunto urgente cuando la clnica est cerrada y que no puede esperar hasta el siguiente da hbil, puede llamar/localizar a su doctor(a) al nmero que aparece a continuacin.   Por favor, tenga en cuenta que aunque hacemos todo lo posible para estar disponibles para asuntos urgentes fuera del horario de [place], no estamos disponibles las 24 horas del da, los 7 [address]  [address] de la [place].   Si tiene un problema urgente y no puede comunicarse con nosotros, puede optar por buscar atencin mdica  en el consultorio de su doctor(a), en una clnica privada, en un centro de atencin urgente o en una sala de emergencias.  Si tiene Engineer, drilling, por favor llame inmediatamente al 911 o vaya a la sala de emergencias.  Nmeros de bper  - Dr. Bary Likes: [phone]  - Dra. Annette Barters: [phone]  - Dr. Felipe Horton: [phone]   En caso de inclemencias del tiempo, por favor llame a Lajuan Pila principal al [phone] para una actualizacin sobre el [place] de cualquier retraso o cierre.  Consejos para la medicacin en dermatologa: Por favor, guarde las cajas en las que vienen los medicamentos de uso tpico para ayudarle a seguir las instrucciones sobre dnde y cmo usarlos. Las farmacias generalmente imprimen las instrucciones del medicamento slo en las cajas y no directamente en los tubos del [place].   Si su medicamento es muy caro, por favor, pngase en contacto con Bettyjane Brunet llamando al [phone] y presione la opcin 4 o envenos un mensaje a travs de Clinical cytogeneticist.   No podemos decirle cul ser su copago por los medicamentos por adelantado ya que esto es diferente dependiendo de  la cobertura de su seguro. Sin embargo, es posible que podamos encontrar un  medicamento sustituto a Audiological scientist un formulario para que el seguro cubra el medicamento que se considera necesario.   Si se requiere una autorizacin previa para que su compaa de seguros [place] su medicamento, por favor permtanos de 1 a 2 das hbiles para completar este proceso.  Los precios de los medicamentos varan con frecuencia dependiendo del Environmental consultant de dnde se surte la receta y alguna farmacias pueden ofrecer precios ms baratos.  El sitio web www.goodrx.com tiene cupones para medicamentos de Health and safety inspector. Los precios aqu no tienen en cuenta lo que podra costar con la ayuda del seguro (puede ser ms barato con su seguro), pero el sitio web puede darle el precio si no utiliz Tourist information centre manager.  - Puede imprimir el cupn correspondiente y llevarlo con su receta a la farmacia.  - Tambin puede pasar por nuestra oficina durante el horario de atencin regular y Education officer, museum una tarjeta de cupones de GoodRx.  - Si necesita que su receta se enve electrnicamente a una farmacia diferente, informe a nuestra oficina a travs de MyChart de [place] o por telfono llamando al [phone] y presione la opcin 4.

## 2023-05-21 NOTE — Progress Notes (Signed)
 Follow-Up Visit   Subjective  Heather Cain is a 53 y.o. female who presents for the following: Skin Cancer Screening and Full Body Skin Exam hx of warts at right thumb, has been treated with candida antigen injections x 2 and cryotherapy. Reports warts have not cleared. Hx venous stasis dermatitis at lower extremities - using mometasone  cream - as needed.   Reports some spots to check at right cheek, right thumb, and chest   The patient presents for Total-Body Skin Exam (TBSE) for skin cancer screening and mole check. The patient has spots, moles and lesions to be evaluated, some may be new or changing and the patient may have concern these could be cancer.  The following portions of the chart were reviewed this encounter and updated as appropriate: medications, allergies, medical history  Review of Systems:  No other skin or systemic complaints except as noted in HPI or Assessment and Plan.  Objective  Well appearing patient in no apparent distress; mood and affect are within normal limits.  A full examination was performed including scalp, head, eyes, ears, nose, lips, neck, chest, axillae, abdomen, back, buttocks, bilateral upper extremities, bilateral lower extremities, hands, feet, fingers, toes, fingernails, and toenails. All findings within normal limits unless otherwise noted below.   Relevant physical exam findings are noted in the Assessment and Plan.  right thumb x 2 (2) Verrucous papules -- Discussed viral etiology and contagion.  right cheek x 1, trunk x 16 (17) Erythematous stuck-on, waxy papule or plaque  Assessment & Plan   SKIN CANCER SCREENING PERFORMED TODAY.  ACTINIC DAMAGE - Chronic condition, secondary to cumulative UV/sun exposure - diffuse scaly erythematous macules with underlying dyspigmentation - Recommend daily broad spectrum sunscreen SPF 30+ to sun-exposed areas, reapply every 2 hours as needed.  - Staying in the shade or wearing long sleeves, sun  glasses (UVA+UVB protection) and wide brim hats (4-inch brim around the entire circumference of the hat) are also recommended for sun protection.  - Call for new or changing lesions.  LENTIGINES, SEBORRHEIC KERATOSES, HEMANGIOMAS - Benign normal skin lesions - Benign-appearing - Call for any changes  MELANOCYTIC NEVI - Tan-brown and/or pink-flesh-colored symmetric macules and papules - Benign appearing on exam today - Observation - Call clinic for new or changing moles - Recommend daily use of broad spectrum spf 30+ sunscreen to sun-exposed areas.   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  It tends to be exacerbated by stress and cooler weather.  People who have neurologic disease may experience new onset or exacerbation of existing seborrheic dermatitis.  The condition is not curable but treatable and can be controlled.    Ketoconazole  shampoo apply three times per week, massage into scalp and leave in for 10 minutes before rinsing out  Ketoconazole  cream apply to face once a day Mon, Wed, Fri  Start Hydrocortisone  2.5% cream  apply to face once a day Tues, Thurs, Sat    Venous stasis dermatitis of both lower extremities With Shaumberg's purpura lower legs Chronic and persistent condition with duration or expected duration over one year. Condition is symptomatic / bothersome to patient. Not to goal. Stasis in the legs causes chronic leg swelling, which may result in itchy or  painful rashes, skin discoloration, skin texture changes, and sometimes ulceration.  Recommend daily graduated compression hose/stockings- easiest to put on first thing in morning, remove at bedtime.  Elevate legs as much as possible. Avoid salt/sodium rich  foods.  Sample of Vasculera sample one daily  Lot [id number] exp 12/27 14782-956-21 Patient given sample given  Rx sent to [facility]  Continue  Mometasone  cream apply to legs 5 nights a week Topical steroids (such as triamcinolone, fluocinolone, fluocinonide, mometasone , clobetasol, halobetasol, betamethasone , hydrocortisone ) can cause thinning and lightening of the skin if they are used for too long in the same area. Your physician has selected the right strength medicine for your problem and area affected on the body. Please use your medication only as directed by your physician to prevent side effects.    Long term medication management.  Patient is using long term (months to years) prescription medication  to control their dermatologic condition.  These medications require periodic monitoring to evaluate for efficacy and side effects and may require periodic laboratory monitoring.  EPIDERMAL INCLUSION CYST Exam: Subcutaneous nodule at right inframammary  Benign-appearing. Exam most consistent with an epidermal inclusion cyst. Discussed that a cyst is a benign growth that can grow over time and sometimes get irritated or inflamed. Recommend observation if it is not bothersome. Discussed option of surgical excision to remove it if it is growing, symptomatic, or other changes noted. Please call for new or changing lesions so they can be evaluated.  VENOUS STASIS DERMATITIS OF BOTH LOWER EXTREMITIES   Related Medications mometasone  (ELOCON ) 0.1 % cream apply to legs 5 nights a week Dietary Management Product (VASCULERA) TABS Take 1 tab po qd VIRAL WARTS, UNSPECIFIED TYPE (2) right thumb x 2 (2) Viral Wart (HPV) Counseling  Discussed viral / HPV (Human Papilloma Virus) etiology and risk of spread /infectivity to other areas of body as well as to other people.  Multiple treatments and methods may be required to clear warts and it is possible treatment may not be successful.  Treatment risks  include discoloration; scarring and there is still potential for wart recurrence.  Start prescription 5-fluorouracil/salicylic acid wart paste from [facility] nightly under occlusion. Reviewed risk of irritation and risk scarring if applied to normal skin. If irritation develops, stop medication for a few days until area calm, then restart a very small amount just to the wart. This medication cannot be used by pregnant women. Patient advised they will receive an email from the [facility] pharmacy and can purchase the medication online through a link in the email.  Destruction of lesion - right thumb x 2 (2) Complexity: simple   Destruction method: cryotherapy   Informed consent: discussed and consent obtained   Timeout:  patient name, date of birth, surgical site, and procedure verified Lesion destroyed using liquid nitrogen: Yes   Region frozen until ice ball extended beyond lesion: Yes   Outcome: patient tolerated procedure well with no complications   Post-procedure details: wound care instructions given    Intralesional injection - right thumb x 2 (2) Location: right thumb x 2  Informed Consent: Discussed risks (infection, pain, bleeding, bruising, thinning of the skin, loss of skin pigment, lack of resolution, and recurrence of lesion) and benefits of the procedure, as well as the alternatives. Informed consent was obtained. Preparation: The area was prepared a standard fashion.  Lot [id number] exp 07/2023  Procedure Details: An intralesional injection was performed with candida antigen. 0.5 cc in total were injected.  Total  number of injections: 2  Plan: The patient was instructed on post-op care. Recommend OTC analgesia as needed for pain.  Lot [id number] exp 07/2023    INFLAMED SEBORRHEIC KERATOSIS (17) right cheek x 1, trunk x 16 (17) Symptomatic, irritating, patient would like treated. Destruction of lesion - right cheek x 1, trunk x 16 (17) Complexity: simple    Destruction method: cryotherapy   Informed consent: discussed and consent obtained   Timeout:  patient name, date of birth, surgical site, and procedure verified Lesion destroyed using liquid nitrogen: Yes   Region frozen until ice ball extended beyond lesion: Yes   Outcome: patient tolerated procedure well with no complications   Post-procedure details: wound care instructions given   ACTINIC SKIN DAMAGE   LENTIGO   MELANOCYTIC NEVUS, UNSPECIFIED LOCATION   SKIN CANCER SCREENING   CYST OF SKIN   SEBORRHEIC DERMATITIS   Related Medications ketoconazole  (NIZORAL ) 2 % shampoo apply three times per week, massage into scalp and leave in for 10 minutes before rinsing out hydrocortisone  2.5 % cream apply to face once a day Tues, Thurs, Sat COUNSELING AND COORDINATION OF CARE   MEDICATION MANAGEMENT   Return in about 6 months (around 11/21/2023) for wart follow up and venous stasis , 1 year tbse .  IRandee Busing, CMA, am acting as scribe for Celine Collard, MD.   Documentation: I have reviewed the above documentation for accuracy and completeness, and I agree with the above.  Celine Collard, MD

## 2023-05-22 DIAGNOSIS — M5459 Other low back pain: Secondary | ICD-10-CM | POA: Diagnosis not present

## 2023-05-22 DIAGNOSIS — R262 Difficulty in walking, not elsewhere classified: Secondary | ICD-10-CM | POA: Diagnosis not present

## 2023-05-22 DIAGNOSIS — F411 Generalized anxiety disorder: Secondary | ICD-10-CM | POA: Diagnosis not present

## 2023-05-23 ENCOUNTER — Telehealth: Payer: Self-pay

## 2023-05-23 NOTE — Telephone Encounter (Signed)
 Patient called regarding surgery reminder letter. She is on blood thinners and scheduled for surgery with you for cyst removal from Dr. Bary Likes. Please advise. aw

## 2023-05-24 DIAGNOSIS — R262 Difficulty in walking, not elsewhere classified: Secondary | ICD-10-CM | POA: Diagnosis not present

## 2023-05-24 DIAGNOSIS — M5459 Other low back pain: Secondary | ICD-10-CM | POA: Diagnosis not present

## 2023-05-24 NOTE — Telephone Encounter (Signed)
Left message for pt to return my call. aw

## 2023-05-30 DIAGNOSIS — M5459 Other low back pain: Secondary | ICD-10-CM | POA: Diagnosis not present

## 2023-05-30 DIAGNOSIS — R262 Difficulty in walking, not elsewhere classified: Secondary | ICD-10-CM | POA: Diagnosis not present

## 2023-06-01 DIAGNOSIS — G4733 Obstructive sleep apnea (adult) (pediatric): Secondary | ICD-10-CM | POA: Diagnosis not present

## 2023-06-05 DIAGNOSIS — F411 Generalized anxiety disorder: Secondary | ICD-10-CM | POA: Diagnosis not present

## 2023-06-06 ENCOUNTER — Encounter: Payer: Self-pay | Admitting: Dermatology

## 2023-06-06 ENCOUNTER — Ambulatory Visit: Admitting: Dermatology

## 2023-06-06 DIAGNOSIS — D492 Neoplasm of unspecified behavior of bone, soft tissue, and skin: Secondary | ICD-10-CM | POA: Diagnosis not present

## 2023-06-06 DIAGNOSIS — L720 Epidermal cyst: Secondary | ICD-10-CM | POA: Diagnosis not present

## 2023-06-06 MED ORDER — MUPIROCIN 2 % EX OINT: 1.0000 | TOPICAL_OINTMENT | Freq: Every day | CUTANEOUS | 1 refills | Status: DC

## 2023-06-06 NOTE — Progress Notes (Signed)
   Follow-Up Visit   Subjective  Heather Cain is a 53 y.o. female who presents for the following: Cyst vs other of the R inframammary excision The patient has spots, moles and lesions to be evaluated, some may be new or changing and the patient may have concern these could be cancer.   The following portions of the chart were reviewed this encounter and updated as appropriate: medications, allergies, medical history  Review of Systems:  No other skin or systemic complaints except as noted in HPI or Assessment and Plan.  Objective  Well appearing patient in no apparent distress; mood and affect are within normal limits.   A focused examination was performed of the following areas: chest  Relevant exam findings are noted in the Assessment and Plan.  Right Inframammary SQ nodule  Assessment & Plan   EIC (EPIDERMAL INCLUSION CYST) Right Inframammary Skin excision - Right Inframammary  Excision method:  elliptical Total excision diameter (cm):  1 Informed consent: discussed and consent obtained   Timeout: patient name, date of birth, surgical site, and procedure verified   Procedure prep:  Patient was prepped and draped in usual sterile fashion Prep type:  Chlorhexidine  Anesthesia: the lesion was anesthetized in a standard fashion   Anesthetic:  1% lidocaine  w/ epinephrine  1-100,000 buffered w/ 8.4% NaHCO3 Instrument used: #15 blade   Hemostasis achieved with: suture, pressure and electrodesiccation   Outcome: patient tolerated procedure well with no complications    Skin repair - Right Inframammary Complexity:  Intermediate Final length (cm):  2.8 Informed consent: discussed and consent obtained   Timeout: patient name, date of birth, surgical site, and procedure verified   Procedure prep:  Patient was prepped and draped in usual sterile fashion Prep type:  Chlorhexidine  Anesthesia: the lesion was anesthetized in a standard fashion   Anesthetic:  1% lidocaine  w/  epinephrine  1-100,000 buffered w/ 8.4% NaHCO3 Reason for type of repair: reduce tension to allow closure, reduce the risk of dehiscence, infection, and necrosis, reduce subcutaneous dead space and avoid a hematoma, allow closure of the large defect and preserve normal anatomy   Undermining: edges could be approximated without difficulty   Subcutaneous layers (deep stitches):  Suture size:  4-0 Suture type: Monocryl (poliglecaprone 25)   Stitches:  Buried vertical mattress Fine/surface layer approximation (top stitches):  Suture size:  5-0 Suture type: Prolene (polypropylene)   Stitches: simple running   Suture removal (days):  7 Hemostasis achieved with: suture, pressure and electrodesiccation Outcome: patient tolerated procedure well with no complications   Post-procedure details: sterile dressing applied and wound care instructions given   Dressing type: bandage, pressure dressing and bacitracin    mupirocin ointment (BACTROBAN) 2 % - Right Inframammary Apply 1 Application topically daily. Qd to excision site Specimen 1 - Surgical pathology Differential Diagnosis: Cyst vs other  Check Margins: No SQ nodule 1.0cm  Return in about 1 week (around 06/13/2023) for suture removal.  I, Sonya Hupman, RMA, am acting as scribe for Harris Liming, MD .   Documentation: I have reviewed the above documentation for accuracy and completeness, and I agree with the above.  Harris Liming, MD

## 2023-06-06 NOTE — Patient Instructions (Signed)

## 2023-06-07 ENCOUNTER — Telehealth: Payer: Self-pay

## 2023-06-07 NOTE — Telephone Encounter (Signed)
-----   Message from [facility] Sonya H sent at 06/06/2023  4:57 PM EDT ----- Can someone please call my surgery patient 06/07/23 in the afternoon.  She works 3rd shift so would prefer an afternoon call.  Thank you Sonya

## 2023-06-07 NOTE — Telephone Encounter (Signed)
 Patient states that she has had no complications following her surgery yesterday.

## 2023-06-12 ENCOUNTER — Ambulatory Visit: Payer: Self-pay | Admitting: Dermatology

## 2023-06-12 DIAGNOSIS — F411 Generalized anxiety disorder: Secondary | ICD-10-CM | POA: Diagnosis not present

## 2023-06-12 LAB — SURGICAL PATHOLOGY

## 2023-06-13 DIAGNOSIS — R200 Anesthesia of skin: Secondary | ICD-10-CM | POA: Diagnosis not present

## 2023-06-13 DIAGNOSIS — M545 Low back pain, unspecified: Secondary | ICD-10-CM | POA: Diagnosis not present

## 2023-06-13 DIAGNOSIS — G5602 Carpal tunnel syndrome, left upper limb: Secondary | ICD-10-CM | POA: Diagnosis not present

## 2023-06-13 DIAGNOSIS — M79642 Pain in left hand: Secondary | ICD-10-CM | POA: Diagnosis not present

## 2023-06-13 DIAGNOSIS — M65321 Trigger finger, right index finger: Secondary | ICD-10-CM | POA: Diagnosis not present

## 2023-06-13 DIAGNOSIS — R202 Paresthesia of skin: Secondary | ICD-10-CM | POA: Diagnosis not present

## 2023-06-13 DIAGNOSIS — D1720 Benign lipomatous neoplasm of skin and subcutaneous tissue of unspecified limb: Secondary | ICD-10-CM | POA: Diagnosis not present

## 2023-06-13 DIAGNOSIS — M79641 Pain in right hand: Secondary | ICD-10-CM | POA: Diagnosis not present

## 2023-06-14 ENCOUNTER — Ambulatory Visit: Admitting: Dermatology

## 2023-06-14 ENCOUNTER — Encounter: Payer: Self-pay | Admitting: Dermatology

## 2023-06-14 DIAGNOSIS — L720 Epidermal cyst: Secondary | ICD-10-CM

## 2023-06-14 DIAGNOSIS — Z4802 Encounter for removal of sutures: Secondary | ICD-10-CM

## 2023-06-14 DIAGNOSIS — Z5189 Encounter for other specified aftercare: Secondary | ICD-10-CM

## 2023-06-14 NOTE — Patient Instructions (Signed)

## 2023-06-14 NOTE — Progress Notes (Signed)
   Follow-Up Visit   Subjective  Heather Cain is a 53 y.o. female who presents for the following: Cyst of follicular infundibulum, bx proven, R inframammary, pt presents for suture removal The patient has spots, moles and lesions to be evaluated, some may be new or changing and the patient may have concern these could be cancer.   The following portions of the chart were reviewed this encounter and updated as appropriate: medications, allergies, medical history  Review of Systems:  No other skin or systemic complaints except as noted in HPI or Assessment and Plan.  Objective  Well appearing patient in no apparent distress; mood and affect are within normal limits.   A focused examination was performed of the following areas: chest  Relevant exam findings are noted in the Assessment and Plan.    Assessment & Plan   CYST OF FOLLICULAR INFUNDIBULUM  Bx proven R inframammary Exam: healing excision site  Treatment Plan: Encounter for Removal of Sutures - Incision site at the R inframammary is clean, dry and intact - Wound cleansed, sutures removed, wound cleansed and steri strips applied.  - Discussed pathology results showing CYST OF FOLLICULAR INFUNDIBULUM   - Patient advised to keep steri-strips dry until they fall off. - Scars remodel for a full year. - Once steri-strips fall off, patient can apply over-the-counter silicone scar cream each night to help with scar remodeling if desired. - Patient advised to call with any concerns or if they notice any new or changing lesions.    VISIT FOR WOUND CHECK   ENCOUNTER FOR REMOVAL OF SUTURES    Return for as scheduled for [facility].  I, Rollie Clipper, RMA, am acting as scribe for Harris Liming, MD .   Documentation: I have reviewed the above documentation for accuracy and completeness, and I agree with the above.  Harris Liming, MD

## 2023-06-15 ENCOUNTER — Ambulatory Visit: Payer: Medicaid Other | Admitting: Nurse Practitioner

## 2023-06-15 ENCOUNTER — Encounter: Payer: Self-pay | Admitting: Nurse Practitioner

## 2023-06-15 VITALS — BP 128/82 | HR 75 | Temp 97.4°F | Ht 63.0 in | Wt 314.0 lb

## 2023-06-15 DIAGNOSIS — Z6841 Body Mass Index (BMI) 40.0 and over, adult: Secondary | ICD-10-CM | POA: Diagnosis not present

## 2023-06-15 DIAGNOSIS — E1169 Type 2 diabetes mellitus with other specified complication: Secondary | ICD-10-CM | POA: Diagnosis not present

## 2023-06-15 DIAGNOSIS — R42 Dizziness and giddiness: Secondary | ICD-10-CM

## 2023-06-15 DIAGNOSIS — R600 Localized edema: Secondary | ICD-10-CM | POA: Diagnosis not present

## 2023-06-15 DIAGNOSIS — Z7985 Long-term (current) use of injectable non-insulin antidiabetic drugs: Secondary | ICD-10-CM | POA: Diagnosis not present

## 2023-06-15 LAB — MICROALBUMIN / CREATININE URINE RATIO
Creatinine,U: 40.9 mg/dL
Microalb Creat Ratio: UNDETERMINED mg/g (ref 0.0–30.0)
Microalb, Ur: <0.7 mg/dL

## 2023-06-15 MED ORDER — NEOMYCIN-POLYMYXIN-HC 1 % OT SOLN: OTIC | 0 refills | Status: AC

## 2023-06-15 NOTE — Patient Instructions (Addendum)
 Today, we discussed your ongoing challenges with weight loss despite your efforts with diet, exercise, and medication. We also reviewed your diabetes management, back pain, blood pressure, and other health concerns. OBESITY: Difficulty losing weight despite physical activity and medication. -Refer to weight management clinic for dietary and exercise guidance. -Check insurance coverage for Mounjaro . -Switch to Mounjaro  if insurance approves. -Continue Ozempic  until Mounjaro  is approved.  TYPE 2 DIABETES MELLITUS: Diabetes managed with Ozempic . Blood sugar levels generally well-controlled. Last A1c was 7.6%. -Order fasting labs to check A1c and microalbumin levels. -Monitor blood sugar levels regularly.  DIZZINESS: Occasional dizziness with fluid in right ear. -Prescribe ear drops for fluid in the right ear. -Consider ENT referral if symptoms persist.

## 2023-06-15 NOTE — Progress Notes (Signed)
 Established Patient Office Visit  Subjective:  Patient ID: Heather Cain, female    DOB: Nov 01, 1970  Age: 53 y.o. MRN: [id number]  CC:  Chief Complaint  Patient presents with   Medical Management of Chronic Issues   Discussed the use of a AI scribe software for clinical note transcription with the patient, who gave verbal consent to proceed.  HPI  Heather Cain is a 53 year old female with type 2 diabetes and obesity who presents with difficulty losing weight despite lifestyle modifications and medication.  She adheres to a mostly healthy diet and has increased physical activity, including physical therapy and water  walking twice a week due to two bulging discs in her back. She also engages in kayaking and hiking. Despite these efforts, she has not achieved significant weight loss. She is on the maximum dose of Ozempic  for diabetes and weight management. Her blood sugar levels are generally well-controlled, with fasting morning readings between 115 and 140 mg/dL. Her last A1c was 7.6% two months ago.  She experiences occasional dizziness, particularly with sudden movements or getting up quickly, which resolves within seconds. She takes Flonase  and Claritin  daily.  She has a history of carpal tunnel syndrome with numbness and tingling in her left hand. She has reached the limit for steroid injections in her right hand and is considering surgery.  She is experiencing menopause symptoms, with her last menstrual cycle in January, and occasional hot flashes. She is concerned about the impact of menopause on her weight loss efforts. She reports occasional numbness and tingling in her left leg. Her eyes drip constantly, possibly related to dry eyes. She is part-time driving for [employer], a few hours a week, and is managing her activities despite her symptoms. No constipation or diarrhea, with regular bowel movements.  HPI   Past Medical History:  Diagnosis Date   DCIS (ductal carcinoma in  situ) of breast    Diabetes mellitus without complication (HCC)    Family history of bladder cancer    Family history of uterine cancer    High cholesterol    Neuromuscular disorder (HCC)    peripheral neuropathy   PONV (postoperative nausea and vomiting)    Vaginal Pap smear, abnormal     Past Surgical History:  Procedure Laterality Date   BREAST BIOPSY Right 03/08/2021   right breast bx coil clip -positive   BREAST BIOPSY Right 03/24/2021   stereo biopsy x clip/DUCTAL CARCINOMA IN SITU (DCIS),   BREAST BIOPSY Left 05/18/2021   ribbon clip   BREAST BIOPSY Right 05/18/2021   coil clip   COLONOSCOPY WITH PROPOFOL  N/A 03/08/2022   Procedure: COLONOSCOPY WITH PROPOFOL ;  Surgeon: Therisa Bi, MD;  Location: [facility] ENDOSCOPY;  Service: Gastroenterology;  Laterality: N/A;  REQUESTED LAST CASE   CRYOTHERAPY     EVACUATION BREAST HEMATOMA Right 06/16/2021   Procedure: EVACUATION HEMATOMA BREAST;  Surgeon: Lane Shope, MD;  Location: [facility] ORS;  Service: General;  Laterality: Right;   MASTECTOMY     SIMPLE MASTECTOMY WITH AXILLARY SENTINEL NODE BIOPSY Right 06/15/2021   Procedure: SIMPLE MASTECTOMY WITH AXILLARY SENTINEL NODE BIOPSY, Total;  Surgeon: Lane Shope, MD;  Location: [facility] ORS;  Service: General;  Laterality: Right;   TOTAL MASTECTOMY Left 06/15/2021   Procedure: TOTAL MASTECTOMY, prophylactic left;  Surgeon: Lane Shope, MD;  Location: [facility] ORS;  Service: General;  Laterality: Left;   WRIST SURGERY      Family History  Problem Relation Age of Onset   Uterine cancer  Mother 73   Bladder Cancer Father 26   Skin cancer Maternal Aunt    Cancer - Other Maternal Grandfather        possible colon cancer   Breast cancer Maternal Great-grandmother     Social History   Socioeconomic History   Marital status: Single    Spouse name: Not on file   Number of children: Not on file   Years of education: Not on file   Highest education level: Some college, no degree   Occupational History   Not on file  Tobacco Use   Smoking status: Former    Current packs/day: 0.00    Types: Cigarettes    Quit date: 05/30/2021    Years since quitting: 2.1   Smokeless tobacco: Never  Vaping Use   Vaping status: Some Days  Substance and Sexual Activity   Alcohol use: Yes    Comment: social   Drug use: Yes    Types: Marijuana   Sexual activity: Not Currently    Birth control/protection: None  Other Topics Concern   Not on file  Social History Narrative   Lives in [place]; self; no children; 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 former Smoke ; rare alcohol.    Social Drivers of Health   Financial Resource Strain: Medium Risk (04/04/2023)   Overall Financial Resource Strain (CARDIA)    Difficulty of Paying Living Expenses: Somewhat hard  Food Insecurity: Food Insecurity Present (04/04/2023)   Hunger Vital Sign    Worried About Running Out of Food in the Last Year: Sometimes true    Ran Out of Food in the Last Year: Never true  Transportation Needs: No Transportation Needs (04/04/2023)   PRAPARE - Administrator, Civil Service (Medical): No    Lack of Transportation (Non-Medical): No  Physical Activity: Sufficiently Active (04/04/2023)   Exercise Vital Sign    Days of Exercise per Week: 3 days    Minutes of Exercise per Session: 60 min  Stress: Stress Concern Present (04/04/2023)   [employer] of Occupational Health - Occupational Stress Questionnaire    Feeling of Stress : To some extent  Social Connections: Socially Isolated (04/04/2023)   Social Connection and Isolation Panel    Frequency of Communication with Friends and Family: More than three times a week    Frequency of Social Gatherings with Friends and Family: Once a week    Attends Religious Services: Never    Database administrator or Organizations: No    Attends Engineer, structural: Not on file    Marital Status: Never married  Intimate Partner Violence: Not At Risk (03/16/2022)    Humiliation, Afraid, Rape, and Kick questionnaire    Fear of Current or Ex-Partner: No    Emotionally Abused: No    Physically Abused: No    Sexually Abused: No     Outpatient Medications Prior to Visit  Medication Sig Dispense Refill   Accu-Chek Softclix Lancets lancets Use as instructed 100 each 4   albuterol  (VENTOLIN  HFA) 108 (90 Base) MCG/ACT inhaler Inhale 2 puffs into the lungs every 6 (six) hours as needed for wheezing or shortness of breath. 8 g 2   cholecalciferol (VITAMIN D3) 25 MCG (1000 UNIT) tablet Take by mouth daily.     Dietary Management Product (VASCULERA) TABS Take 1 tab po qd 30 tablet 6   fluticasone  (FLONASE ) 50 MCG/ACT nasal spray Use 2 spray(s) in each nostril once daily 16 g 3   furosemide  (LASIX ) 20 MG tablet  Take 1 tablet (20 mg total) by mouth daily. (Patient taking differently: Take 20 mg by mouth 3 (three) times a week. Mon, Wed, Fri) 30 tablet 4   gabapentin  (NEURONTIN ) 100 MG capsule 200 mg 2 (two) times daily.     glimepiride  (AMARYL ) 4 MG tablet Take 1 tablet (4 mg total) by mouth daily before breakfast. 90 tablet 3   hydrocortisone  2.5 % cream apply to face once a day Tues, Thurs, Sat 30 g 11   ketoconazole  (NIZORAL ) 2 % shampoo apply three times per week, massage into scalp and leave in for 10 minutes before rinsing out 120 mL 3   loratadine  (CLARITIN ) 10 MG tablet Take 10 mg by mouth daily.     metoprolol  succinate (TOPROL -XL) 25 MG 24 hr tablet TAKE 1 TABLET BY MOUTH AT BEDTIME 90 tablet 3   mometasone  (ELOCON ) 0.1 % cream apply to legs 5 nights a week 45 g 6   rosuvastatin  (CRESTOR ) 20 MG tablet Take 1 tablet (20 mg total) by mouth daily. 90 tablet 3   Salicylic Acid (NIZORAL  PSORIASIS SHAMPOO/COND) 3 % SHAM Apply topically.     Semaglutide , 2 MG/DOSE, 8 MG/3ML SOPN Inject 2 mg as directed once a week. 3 mL 11   ACCU-CHEK GUIDE TEST test strip USE AS DIRECTED 50 each 0   apixaban  (ELIQUIS ) 5 MG TABS tablet Take 1 tablet by mouth twice daily 60 tablet  5   meclizine  (ANTIVERT ) 12.5 MG tablet Take 1 tablet (12.5 mg total) by mouth 2 (two) times daily as needed for dizziness. 30 tablet 0   mupirocin  ointment (BACTROBAN ) 2 % Apply 1 Application topically daily. Qd to excision site 22 g 1   No facility-administered medications prior to visit.    Allergies  Allergen Reactions   Codeine Itching and Anaphylaxis    ROS Review of Systems Negative unless indicated in HPI.    Objective:     Physical Exam Constitutional:      Appearance: Normal appearance.  HENT:     Right Ear: A middle ear effusion is present.     Left Ear: Tympanic membrane normal.     Mouth/Throat:     Mouth: Mucous membranes are moist.   Eyes:     Conjunctiva/sclera: Conjunctivae normal.     Pupils: Pupils are equal, round, and reactive to light.    Cardiovascular:     Rate and Rhythm: Normal rate and regular rhythm.     Pulses: Normal pulses.     Heart sounds: Normal heart sounds.  Pulmonary:     Effort: Pulmonary effort is normal.     Breath sounds: Normal breath sounds.  Abdominal:     General: Bowel sounds are normal.     Palpations: Abdomen is soft.   Musculoskeletal:     Cervical back: Normal range of motion. No tenderness.     Right lower leg: Edema present.     Left lower leg: Edema present.   Skin:    General: Skin is warm.     Findings: No bruising.   Neurological:     General: No focal deficit present.     Mental Status: She is alert and oriented to person, place, and time. Mental status is at baseline.   Psychiatric:        Mood and Affect: Mood normal.        Behavior: Behavior normal.        Thought Content: Thought content normal.  Judgment: Judgment normal.     BP 128/82   Pulse 75   Temp (!) 97.4 F (36.3 C)   Ht 5' 3 (1.6 m)   Wt (!) 314 lb (142.4 kg)   SpO2 95%   BMI 55.62 kg/m  Wt Readings from Last 3 Encounters:  06/17/23 (!) 313 lb 15 oz (142.4 kg)  06/15/23 (!) 314 lb (142.4 kg)  04/17/23 (!) 322 lb  12.8 oz (146.4 kg)     Health Maintenance  Topic Date Due   HIV Screening  Never done   Hepatitis C Screening  Never done   Pneumococcal Vaccine 58-85 Years old (1 of 2 - PCV) Never done   Hepatitis B Vaccines (1 of 3 - 19+ 3-dose series) Never done   COVID-19 Vaccine (5 - 2024-25 season) 09/10/2022   MAMMOGRAM  04/28/2023   OPHTHALMOLOGY EXAM  08/04/2023   INFLUENZA VACCINE  08/10/2023   HEMOGLOBIN A1C  09/20/2023   Diabetic kidney evaluation - Urine ACR  06/14/2024   FOOT EXAM  06/14/2024   Diabetic kidney evaluation - eGFR measurement  06/17/2024   Colonoscopy  03/08/2025   Cervical Cancer Screening (HPV/Pap Cotest)  04/16/2028   DTaP/Tdap/Td (3 - Td or Tdap) 02/11/2032   Zoster Vaccines- Shingrix   Completed   HPV VACCINES  Aged Out   Meningococcal B Vaccine  Aged Out       Topic Date Due   Hepatitis B Vaccines (1 of 3 - 19+ 3-dose series) Never done    Lab Results  Component Value Date   TSH 3.420 06/18/2023   Lab Results  Component Value Date   WBC 6.1 06/18/2023   HGB 13.0 06/18/2023   HCT 39.4 06/18/2023   MCV 92.5 06/18/2023   PLT 174 06/18/2023   Lab Results  Component Value Date   NA 138 06/18/2023   K 3.1 (L) 06/18/2023   CO2 25 06/18/2023   GLUCOSE 154 (H) 06/18/2023   BUN 15 06/18/2023   CREATININE 0.78 06/18/2023   BILITOT 0.7 06/18/2023   ALKPHOS 63 06/18/2023   AST 32 06/18/2023   ALT 25 06/18/2023   PROT 6.4 (L) 06/18/2023   ALBUMIN  3.7 06/18/2023   CALCIUM  9.0 06/18/2023   ANIONGAP 8 06/18/2023   EGFR 91 12/15/2022   GFR 75.41 04/05/2023   Lab Results  Component Value Date   CHOL 109 12/15/2022   Lab Results  Component Value Date   HDL 35 (L) 12/15/2022   Lab Results  Component Value Date   LDLCALC 39 12/15/2022   Lab Results  Component Value Date   TRIG 222 (H) 12/15/2022   Lab Results  Component Value Date   CHOLHDL 3.1 12/15/2022   Lab Results  Component Value Date   HGBA1C 7.6 (H) 03/20/2023      Assessment  & Plan:  Type 2 diabetes mellitus with other specified complication, without long-term current use of insulin  (HCC) Assessment & Plan: Diabetes managed with Ozempic . Blood sugar levels generally well-controlled. Last A1c was 7.6%. Goal is fasting levels 100-120 mg/dL.  - Order fasting labs to check A1c and microalbumin levels. -Check insurance coverage for Mounjaro . -Switch to Mounjaro  if insurance approves. -Continue Ozempic  until Mounjaro  is approved. - Diabetic foot examination normal.  Orders: -     Lipid panel; Future -     CBC with Differential/Platelet; Future -     Comprehensive metabolic panel with GFR; Future -     Microalbumin / creatinine urine ratio  Dizziness Assessment &  Plan: ccasional dizziness with fluid in right ear. -Prescribe ear drops for fluid in the right ear. -Consider ENT referral if symptoms persist.   Morbid obesity (HCC) Assessment & Plan: Difficulty losing weight despite physical activity and 2 mg Ozempic  weekly. -Refer to weight management clinic for dietary and exercise guidance. -Check insurance coverage for Mounjaro . -Switch to Mounjaro  if insurance approves. -Continue Ozempic  until Mounjaro  is approved.  Orders: -     Amb Ref to Medical Weight Management  Bilateral lower extremity edema Assessment & Plan: Bilateral lower extremity edema, left  more than right. -Continue furosemide  daily.   Other orders -     Neomycin -Polymyxin-HC; 4 gtt in affected ear(s) tid, max of 10 days. Lie with affected ear upward x 5 minutes  Dispense: 10 mL; Refill: 0 -     Tirzepatide ; Inject 2.5 mg into the skin once a week.  Dispense: 2 mL; Refill: 1    Follow-up: Return in about 4 months (around 10/15/2023) for follow up with fasting lab 2 days prior.   Binta Statzer, NP

## 2023-06-17 ENCOUNTER — Other Ambulatory Visit: Payer: Self-pay

## 2023-06-17 DIAGNOSIS — E119 Type 2 diabetes mellitus without complications: Secondary | ICD-10-CM | POA: Insufficient documentation

## 2023-06-17 DIAGNOSIS — Z7984 Long term (current) use of oral hypoglycemic drugs: Secondary | ICD-10-CM | POA: Insufficient documentation

## 2023-06-17 DIAGNOSIS — R202 Paresthesia of skin: Secondary | ICD-10-CM | POA: Insufficient documentation

## 2023-06-17 DIAGNOSIS — Z7901 Long term (current) use of anticoagulants: Secondary | ICD-10-CM | POA: Insufficient documentation

## 2023-06-17 DIAGNOSIS — E876 Hypokalemia: Secondary | ICD-10-CM | POA: Insufficient documentation

## 2023-06-17 DIAGNOSIS — R42 Dizziness and giddiness: Secondary | ICD-10-CM | POA: Diagnosis not present

## 2023-06-17 DIAGNOSIS — R200 Anesthesia of skin: Secondary | ICD-10-CM | POA: Diagnosis not present

## 2023-06-17 DIAGNOSIS — R29818 Other symptoms and signs involving the nervous system: Secondary | ICD-10-CM | POA: Diagnosis not present

## 2023-06-17 DIAGNOSIS — Z794 Long term (current) use of insulin: Secondary | ICD-10-CM | POA: Insufficient documentation

## 2023-06-17 DIAGNOSIS — E669 Obesity, unspecified: Secondary | ICD-10-CM | POA: Diagnosis not present

## 2023-06-17 DIAGNOSIS — R231 Pallor: Secondary | ICD-10-CM | POA: Diagnosis not present

## 2023-06-17 NOTE — ED Triage Notes (Signed)
 To ER from home via EMS with report of left hand numbness when waking at 2pm today. Also felt shaky and blood sugar was lower than normal. Reports throughout the day numbness has radiated up arm. Is diabetic and report blood sugar when waking was 97 and ate normal throughout the day. Still reporting feeling clammy.  Sensation is same on both sides. NIH negative.

## 2023-06-18 ENCOUNTER — Emergency Department
Admission: EM | Admit: 2023-06-18 | Discharge: 2023-06-18 | Disposition: A | Discharge status: Home / Self Care | Attending: Emergency Medicine | Admitting: Emergency Medicine

## 2023-06-18 ENCOUNTER — Telehealth: Payer: Self-pay

## 2023-06-18 ENCOUNTER — Encounter: Payer: Self-pay | Admitting: Emergency Medicine

## 2023-06-18 ENCOUNTER — Emergency Department

## 2023-06-18 ENCOUNTER — Encounter: Payer: Self-pay | Admitting: Dermatology

## 2023-06-18 DIAGNOSIS — R202 Paresthesia of skin: Secondary | ICD-10-CM

## 2023-06-18 DIAGNOSIS — R42 Dizziness and giddiness: Secondary | ICD-10-CM

## 2023-06-18 DIAGNOSIS — R200 Anesthesia of skin: Secondary | ICD-10-CM | POA: Diagnosis not present

## 2023-06-18 DIAGNOSIS — R29818 Other symptoms and signs involving the nervous system: Secondary | ICD-10-CM | POA: Diagnosis not present

## 2023-06-18 LAB — CBC WITH DIFFERENTIAL/PLATELET
Abs Immature Granulocytes: 0.01 10*3/uL (ref 0.00–0.07)
Basophils Absolute: 0 10*3/uL (ref 0.0–0.1)
Basophils Relative: 0 %
Eosinophils Absolute: 0.1 10*3/uL (ref 0.0–0.5)
Eosinophils Relative: 2 %
HCT: 39.4 % (ref 36.0–46.0)
Hemoglobin: 13 g/dL (ref 12.0–15.0)
Immature Granulocytes: 0 %
Lymphocytes Relative: 48 %
Lymphs Abs: 2.9 10*3/uL (ref 0.7–4.0)
MCH: 30.5 pg (ref 26.0–34.0)
MCHC: 33 g/dL (ref 30.0–36.0)
MCV: 92.5 fL (ref 80.0–100.0)
Monocytes Absolute: 0.4 10*3/uL (ref 0.1–1.0)
Monocytes Relative: 6 %
Neutro Abs: 2.7 10*3/uL (ref 1.7–7.7)
Neutrophils Relative %: 44 %
Platelets: 174 10*3/uL (ref 150–400)
RBC: 4.26 MIL/uL (ref 3.87–5.11)
RDW: 13.9 % (ref 11.5–15.5)
WBC: 6.1 10*3/uL (ref 4.0–10.5)
nRBC: 0 % (ref 0.0–0.2)

## 2023-06-18 LAB — URINALYSIS, ROUTINE W REFLEX MICROSCOPIC
Bilirubin Urine: NEGATIVE
Glucose, UA: NEGATIVE mg/dL
Hgb urine dipstick: NEGATIVE
Ketones, ur: NEGATIVE mg/dL
Leukocytes,Ua: NEGATIVE
Nitrite: NEGATIVE
Protein, ur: NEGATIVE mg/dL
Specific Gravity, Urine: 1.018 (ref 1.005–1.030)
pH: 5 (ref 5.0–8.0)

## 2023-06-18 LAB — COOXEMETRY PANEL
Carboxyhemoglobin: 0.8 % (ref 0.5–1.5)
Methemoglobin: 0.1 % (ref 0.0–1.5)
O2 Saturation: 45.9 %
Total hemoglobin: 13.3 g/dL (ref 12.0–16.0)

## 2023-06-18 LAB — COMPREHENSIVE METABOLIC PANEL WITH GFR
ALT: 25 U/L (ref 0–44)
AST: 32 U/L (ref 15–41)
Albumin: 3.7 g/dL (ref 3.5–5.0)
Alkaline Phosphatase: 63 U/L (ref 38–126)
Anion gap: 8 (ref 5–15)
BUN: 15 mg/dL (ref 6–20)
CO2: 25 mmol/L (ref 22–32)
Calcium: 9 mg/dL (ref 8.9–10.3)
Chloride: 105 mmol/L (ref 98–111)
Creatinine, Ser: 0.78 mg/dL (ref 0.44–1.00)
GFR, Estimated: >60 mL/min (ref >60)
Glucose, Bld: 154 mg/dL — ABNORMAL HIGH (ref 70–99)
Potassium: 3.1 mmol/L — ABNORMAL LOW (ref 3.5–5.1)
Sodium: 138 mmol/L (ref 135–145)
Total Bilirubin: 0.7 mg/dL (ref 0.0–1.2)
Total Protein: 6.4 g/dL — ABNORMAL LOW (ref 6.5–8.1)

## 2023-06-18 LAB — TROPONIN I (HIGH SENSITIVITY)
Troponin I (High Sensitivity): 2 ng/L (ref <18)
Troponin I (High Sensitivity): <2 ng/L (ref <18)

## 2023-06-18 LAB — TSH: TSH: 3.42 u[IU]/mL (ref 0.350–4.500)

## 2023-06-18 LAB — CBG MONITORING, ED: Glucose-Capillary: 142 mg/dL — ABNORMAL HIGH (ref 70–99)

## 2023-06-18 LAB — POC URINE PREG, ED: Preg Test, Ur: NEGATIVE

## 2023-06-18 LAB — MAGNESIUM: Magnesium: 2.1 mg/dL (ref 1.7–2.4)

## 2023-06-18 LAB — CK: Total CK: 178 U/L (ref 38–234)

## 2023-06-18 MED ORDER — MECLIZINE HCL 25 MG PO TABS: 25.0000 mg | ORAL_TABLET | Freq: Three times a day (TID) | ORAL | 0 refills | Status: AC | PRN

## 2023-06-18 MED ORDER — ONDANSETRON 4 MG PO TBDP: 4.0000 mg | ORAL_TABLET | Freq: Four times a day (QID) | ORAL | 0 refills | Status: AC | PRN

## 2023-06-18 MED ORDER — MECLIZINE HCL 25 MG PO TABS
25.0000 mg | ORAL_TABLET | Freq: Once | ORAL | Status: AC
  Administered 2023-06-18: 25 mg via ORAL
  Filled 2023-06-18: qty 1

## 2023-06-18 MED ORDER — ONDANSETRON HCL 4 MG/2ML IJ SOLN
4.0000 mg | Freq: Once | INTRAMUSCULAR | Status: AC
  Administered 2023-06-18: 4 mg via INTRAVENOUS
  Filled 2023-06-18: qty 2

## 2023-06-18 MED ORDER — POTASSIUM CHLORIDE CRYS ER 20 MEQ PO TBCR
40.0000 meq | EXTENDED_RELEASE_TABLET | Freq: Once | ORAL | Status: AC
  Administered 2023-06-18: 40 meq via ORAL
  Filled 2023-06-18: qty 2

## 2023-06-18 MED ORDER — SODIUM CHLORIDE 0.9 % IV BOLUS (SEPSIS)
1000.0000 mL | Freq: Once | INTRAVENOUS | Status: AC
  Administered 2023-06-18: 1000 mL via INTRAVENOUS

## 2023-06-18 NOTE — ED Notes (Signed)
 Cooxemetry Panel Results   PH 7.31 Pco2 56 Po2  34 Complete Hemoglobin 13.3 O2 Hemoglobin 45.5 Met Hemoglobin 0.1 Bicarb 28.2

## 2023-06-18 NOTE — ED Provider Notes (Signed)
 [facility] Provider Note    Event Date/Time   First MD Initiated Contact with Patient 06/18/23 [phone]     (approximate)   History   Numbness (Left arm since 2p)   HPI  Heather Cain is a 53 y.o. female with history of obesity, hyperlipidemia, diabetes who presents to the emergency department with complaints of numbness that started in the left hand and then radiate up the arm into the shoulder.  Then started feeling numbness in the right hand and is now feeling numbness in the left foot.  Also reports feeling dizzy worse with changing position.  She describes this as feeling lightheaded and vertigo.  She states that she was recently diagnosed with fluid in her right ear and is on eardrops.  She denies chest pain, shortness of breath, vomiting, diarrhea, fever.  No focal weakness.  No head injury.  No headache.  She has had nausea but this has improved.  She does report yesterday working outside at [employer] and was in the heat and was exposed to a generator running next to her for several hours.   History provided by patient.    Past Medical History:  Diagnosis Date   DCIS (ductal carcinoma in situ) of breast    Diabetes mellitus without complication (HCC)    Family history of bladder cancer    Family history of uterine cancer    High cholesterol    Neuromuscular disorder (HCC)    peripheral neuropathy   PONV (postoperative nausea and vomiting)    Vaginal Pap smear, abnormal     Past Surgical History:  Procedure Laterality Date   BREAST BIOPSY Right 03/08/2021   right breast bx coil clip -positive   BREAST BIOPSY Right 03/24/2021   stereo biopsy x clip/DUCTAL CARCINOMA IN SITU (DCIS),   BREAST BIOPSY Left 05/18/2021   ribbon clip   BREAST BIOPSY Right 05/18/2021   coil clip   COLONOSCOPY WITH PROPOFOL  N/A 03/08/2022   Procedure: COLONOSCOPY WITH PROPOFOL ;  Surgeon: Luke Salaam, MD;  Location: [facility] ENDOSCOPY;  Service: Gastroenterology;   Laterality: N/A;  REQUESTED LAST CASE   CRYOTHERAPY     EVACUATION BREAST HEMATOMA Right 06/16/2021   Procedure: EVACUATION HEMATOMA BREAST;  Surgeon: Flynn Hylan, MD;  Location: [facility] ORS;  Service: General;  Laterality: Right;   MASTECTOMY     SIMPLE MASTECTOMY WITH AXILLARY SENTINEL NODE BIOPSY Right 06/15/2021   Procedure: SIMPLE MASTECTOMY WITH AXILLARY SENTINEL NODE BIOPSY, Total;  Surgeon: Flynn Hylan, MD;  Location: [facility] ORS;  Service: General;  Laterality: Right;   TOTAL MASTECTOMY Left 06/15/2021   Procedure: TOTAL MASTECTOMY, prophylactic left;  Surgeon: Flynn Hylan, MD;  Location: [facility] ORS;  Service: General;  Laterality: Left;   WRIST SURGERY      MEDICATIONS:  Prior to Admission medications   Medication Sig Start Date End Date Taking? Authorizing Provider  ACCU-CHEK GUIDE TEST test strip USE AS DIRECTED 04/30/23   Tona Francis, NP  Accu-Chek Softclix Lancets lancets Use as instructed 06/22/22   Kaur, Charanpreet, NP  albuterol  (VENTOLIN  HFA) 108 (90 Base) MCG/ACT inhaler Inhale 2 puffs into the lungs every 6 (six) hours as needed for wheezing or shortness of breath. 12/15/22   Tona Francis, NP  apixaban  (ELIQUIS ) 5 MG TABS tablet Take 1 tablet by mouth twice daily 12/25/22   Riddle, Suzann, NP  cholecalciferol (VITAMIN D3) 25 MCG (1000 UNIT) tablet Take by mouth daily.    [provider]  Dietary Management Product Primus Brookes)  TABS Take 1 tab po qd 05/21/23   Kowalski, David C, MD  fluticasone  (FLONASE ) 50 MCG/ACT nasal spray Use 2 spray(s) in each nostril once daily 04/03/23   Kaur, Charanpreet, NP  furosemide  (LASIX ) 20 MG tablet Take 1 tablet (20 mg total) by mouth daily. Patient taking differently: Take 20 mg by mouth 3 (three) times a week. Mon, Wed, Fri 08/01/22   Kaur, Charanpreet, NP  gabapentin  (NEURONTIN ) 100 MG capsule 200 mg 2 (two) times daily. 08/04/20   [provider]  glimepiride  (AMARYL ) 4 MG tablet Take 1 tablet (4 mg total)  by mouth daily before breakfast. 12/15/22   Tona Francis, NP  hydrocortisone  2.5 % cream apply to face once a day Tues, Thurs, Sat 02/01/22   Elta Halter, MD  ketoconazole  (NIZORAL ) 2 % shampoo apply three times per week, massage into scalp and leave in for 10 minutes before rinsing out 02/01/22   Elta Halter, MD  loratadine  (CLARITIN ) 10 MG tablet Take 10 mg by mouth daily.    [provider]  meclizine  (ANTIVERT ) 12.5 MG tablet Take 1 tablet (12.5 mg total) by mouth 2 (two) times daily as needed for dizziness. 04/05/23   Kaur, Charanpreet, NP  metoprolol  succinate (TOPROL -XL) 25 MG 24 hr tablet TAKE 1 TABLET BY MOUTH AT BEDTIME 04/16/23   Riddle, Suzann, NP  mometasone  (ELOCON ) 0.1 % cream apply to legs 5 nights a week 02/01/22   Elta Halter, MD  NEOMYCIN -POLYMYXIN-HYDROCORTISONE  (CORTISPORIN) 1 % SOLN OTIC solution 4 gtt in affected ear(s) tid, max of 10 days. Lie with affected ear upward x 5 minutes 06/15/23   Kaur, Charanpreet, NP  rosuvastatin  (CRESTOR ) 20 MG tablet Take 1 tablet (20 mg total) by mouth daily. 04/03/23   Kaur, Charanpreet, NP  Salicylic Acid (NIZORAL  PSORIASIS SHAMPOO/COND) 3 % SHAM Apply topically.    [provider]  Semaglutide , 2 MG/DOSE, 8 MG/3ML SOPN Inject 2 mg as directed once a week. 04/16/23   Tona Francis, NP    Physical Exam   Triage Vital Signs: ED Triage Vitals  Encounter Vitals Group     BP 06/18/23 0000 122/65     Systolic BP Percentile --      Diastolic BP Percentile --      Pulse Rate 06/18/23 0000 85     Resp 06/18/23 0000 17     Temp 06/18/23 0000 98.4 F (36.9 C)     Temp Source 06/18/23 0000 Oral     SpO2 06/18/23 0000 96 %     Weight 06/17/23 2359 (!) 313 lb 15 oz (142.4 kg)     Height 06/17/23 2359 5\' 3"  (1.6 m)     Head Circumference --      Peak Flow --      Pain Score 06/18/23 0001 0     Pain Loc --      Pain Education --      Exclude from Growth Chart --     Most recent vital signs: Vitals:    06/18/23 0205 06/18/23 0527  BP: 117/86 107/60  Pulse: 69 75  Resp: 16 18  Temp: 98.3 F (36.8 C)   SpO2: 97% 98%    CONSTITUTIONAL: Alert, responds appropriately to questions. Well-appearing; well-nourished HEAD: Normocephalic, atraumatic EYES: Conjunctivae clear, pupils appear equal, sclera nonicteric ENT: normal nose; moist mucous membranes NECK: Supple, normal ROM CARD: RRR; S1 and S2 appreciated RESP: Normal chest excursion without splinting or tachypnea; breath sounds clear and equal bilaterally; no wheezes,  no rhonchi, no rales, no hypoxia or respiratory distress, speaking full sentences ABD/GI: Non-distended; soft, non-tender, no rebound, no guarding, no peritoneal signs BACK: The back appears normal EXT: Normal ROM in all joints; no deformity noted, no edema SKIN: Normal color for age and race; warm; no rash on exposed skin NEURO: Moves all extremities equally, normal speech, normal sensation diffusely, no pronator drift, no facial asymmetry PSYCH: The patient's mood and manner are appropriate.   ED Results / Procedures / Treatments   LABS: (all labs ordered are listed, but only abnormal results are displayed) Labs Reviewed  COMPREHENSIVE METABOLIC PANEL WITH GFR - Abnormal; Notable for the following components:      Result Value   Potassium 3.1 (*)    Glucose, Bld 154 (*)    Total Protein 6.4 (*)    All other components within normal limits  URINALYSIS, ROUTINE W REFLEX MICROSCOPIC - Abnormal; Notable for the following components:   Color, Urine YELLOW (*)    APPearance HAZY (*)    All other components within normal limits  CBG MONITORING, ED - Abnormal; Notable for the following components:   Glucose-Capillary 142 (*)    All other components within normal limits  CBC WITH DIFFERENTIAL/PLATELET  MAGNESIUM  CK  TSH  COOXEMETRY PANEL  CBG MONITORING, ED  POC URINE PREG, ED  TROPONIN I (HIGH SENSITIVITY)  TROPONIN I (HIGH SENSITIVITY)     EKG:  EKG  Interpretation Date/Time:  Monday June 18 2023 00:08:10 EDT Ventricular Rate:  83 PR Interval:  198 QRS Duration:  86 QT Interval:  396 QTC Calculation: 465 R Axis:   -43  Text Interpretation: Normal sinus rhythm with sinus arrhythmia Left axis deviation Low voltage QRS Abnormal ECG When compared with ECG of 15-Nov-2022 17:14, QRS axis Shifted left Confirmed by Verneda Golder [phone]) on 06/18/2023 5:36:18 AM         RADIOLOGY: My personal review and interpretation of imaging: MRI brain shows no acute abnormality.  I have personally reviewed all radiology reports.   MR BRAIN WO CONTRAST Result Date: 06/18/2023 CLINICAL DATA:  Acute neurologic deficit EXAM: MRI HEAD WITHOUT CONTRAST TECHNIQUE: Multiplanar, multiecho pulse sequences of the brain and surrounding structures were obtained without intravenous contrast. COMPARISON:  08/15/2020 FINDINGS: Brain: No acute infarct, mass effect or extra-axial collection. No acute or chronic hemorrhage. Normal white matter signal, parenchymal volume and CSF spaces. The midline structures are normal. Vascular: Normal flow voids. Skull and upper cervical spine: Normal calvarium and skull base. Visualized upper cervical spine and soft tissues are normal. Sinuses/Orbits:No paranasal sinus fluid levels or advanced mucosal thickening. No mastoid or middle ear effusion. Normal orbits. IMPRESSION: Normal brain MRI. Electronically Signed   By: Juanetta Nordmann M.D.   On: 06/18/2023 02:13   CT HEAD WO CONTRAST ( ) Result Date: 06/18/2023 CLINICAL DATA:  Neuro deficit, acute stroke suspected, left hand numbness, low blood sugar EXAM: CT HEAD WITHOUT CONTRAST TECHNIQUE: Contiguous axial images were obtained from the base of the skull through the vertex without intravenous contrast. RADIATION DOSE REDUCTION: This exam was performed according to the departmental dose-optimization program which includes automated exposure control, adjustment of the mA and/or kV according to  patient size and/or use of iterative reconstruction technique. COMPARISON:  CT head 07/29/2022 FINDINGS: Brain: No intracranial hemorrhage, mass effect, or evidence of acute infarct. No hydrocephalus. No extra-axial fluid collection. Vascular: No hyperdense vessel or unexpected calcification. Skull: No fracture or focal lesion. Sinuses/Orbits: No acute finding. Other: None. IMPRESSION: No acute  intracranial abnormality. Electronically Signed   By: Rozell Cornet M.D.   On: 06/18/2023 01:06     PROCEDURES:  Critical Care performed: No    Procedures    IMPRESSION / MDM / ASSESSMENT AND PLAN / ED COURSE  I reviewed the triage vital signs and the nursing notes.    Patient here with complaints of paresthesias, dizziness.  The patient is on the cardiac monitor to evaluate for evidence of arrhythmia and/or significant heart rate changes.   DIFFERENTIAL DIAGNOSIS (includes but not limited to):   Stroke, TIA, less likely intracranial hemorrhage, anemia, electrolyte derangement, thyroid  dysfunction, dehydration, benign positional vertigo, carbon monoxide poisoning, thyroid  dysfunction, rhabdomyolysis   Patient's presentation is most consistent with acute presentation with potential threat to life or bodily function.   PLAN: Workup initiated from triage.  EKG nonischemic without arrhythmia.  Troponin x 2 negative.  Normal hemoglobin.  Potassium of 3.1.  Will give replacement.  Normal blood glucose.  Will check magnesium level, CK, TSH.  Will give IV fluids, Zofran  for symptomatic relief.  MRI of the brain reviewed and interpreted by myself and the radiologist and is unremarkable.  Urine shows no sign of infection.  Pregnancy test negative.   MEDICATIONS GIVEN IN ED: Medications  sodium chloride  0.9 % bolus 1,000 mL (0 mLs Intravenous Stopped 06/18/23 0528)  ondansetron  (ZOFRAN ) injection 4 mg (4 mg Intravenous Given 06/18/23 0407)  potassium chloride  SA (KLOR-CON  M) CR tablet 40 mEq (40 mEq  Oral Given 06/18/23 0415)  meclizine  (ANTIVERT ) tablet 25 mg (25 mg Oral Given 06/18/23 0459)     ED COURSE: TSH, magnesium, CK normal.  Normal Co. ox panel.  Please see paramedic note for further details.  Patient still feeling dizzy.  Will give meclizine .  She is able to tolerate p.o. here.  Recommended increase fluid intake, rest at home and will discharge with meclizine , Zofran .  Recommended close follow-up with her outpatient doctors.  Patient comfortable with this plan.   At this time, I do not feel there is any life-threatening condition present. I reviewed all nursing notes, vitals, pertinent previous records.  All lab and urine results, EKGs, imaging ordered have been independently reviewed and interpreted by myself.  I reviewed all available radiology reports from any imaging ordered this visit.  Based on my assessment, I feel the patient is safe to be discharged home without further emergent workup and can continue workup as an outpatient as needed. Discussed all findings, treatment plan as well as usual and customary return precautions.  They verbalize understanding and are comfortable with this plan.  Outpatient follow-up has been provided as needed.  All questions have been answered.   CONSULTS:  none   OUTSIDE RECORDS REVIEWED: Reviewed PCP note on 06/15/2023 for dizziness.  Patient was referred to ENT at that time.       FINAL CLINICAL IMPRESSION(S) / ED DIAGNOSES   Final diagnoses:  Paresthesia  Dizziness     Rx / DC Orders   ED Discharge Orders          Ordered    ondansetron  (ZOFRAN -ODT) 4 MG disintegrating tablet  Every 6 hours PRN        06/18/23 0448    meclizine  (ANTIVERT ) 25 MG tablet  3 times daily PRN        06/18/23 0448             Note:  This document was prepared using Dragon voice recognition software and may include unintentional dictation errors.  Manford Sprong, Clover Dao, DO 06/18/23 [phone]

## 2023-06-18 NOTE — ED Notes (Signed)
 Patient reports while in the waiting room developed some numbness to right arm. Headache has gone away.

## 2023-06-18 NOTE — Telephone Encounter (Signed)
 Called [employer] about a e fax that we received that looks like a scan.

## 2023-06-18 NOTE — Telephone Encounter (Signed)
 Called Patient regarding e fax from [employer]. Patient states she has had a CPAP for a good while now. Patient states she was in the ED last night for dehydration. Patient was asking about Mounjaro .

## 2023-06-20 DIAGNOSIS — R262 Difficulty in walking, not elsewhere classified: Secondary | ICD-10-CM | POA: Diagnosis not present

## 2023-06-20 DIAGNOSIS — M5459 Other low back pain: Secondary | ICD-10-CM | POA: Diagnosis not present

## 2023-06-21 ENCOUNTER — Other Ambulatory Visit: Payer: Self-pay | Admitting: Cardiology

## 2023-06-21 DIAGNOSIS — I480 Paroxysmal atrial fibrillation: Secondary | ICD-10-CM

## 2023-06-21 NOTE — Telephone Encounter (Signed)
 Prescription refill request for Eliquis  received. Indication: a fib Last office visit: 11/29/22 Scr: 0.78 epic 06/18/23 Age: 53 Weight: 142kg

## 2023-06-22 ENCOUNTER — Other Ambulatory Visit: Payer: Self-pay | Admitting: Nurse Practitioner

## 2023-06-22 DIAGNOSIS — E119 Type 2 diabetes mellitus without complications: Secondary | ICD-10-CM

## 2023-06-27 DIAGNOSIS — M5459 Other low back pain: Secondary | ICD-10-CM | POA: Diagnosis not present

## 2023-06-27 DIAGNOSIS — R262 Difficulty in walking, not elsewhere classified: Secondary | ICD-10-CM | POA: Diagnosis not present

## 2023-07-05 DIAGNOSIS — G5603 Carpal tunnel syndrome, bilateral upper limbs: Secondary | ICD-10-CM | POA: Diagnosis not present

## 2023-07-05 DIAGNOSIS — M65321 Trigger finger, right index finger: Secondary | ICD-10-CM | POA: Diagnosis not present

## 2023-07-09 DIAGNOSIS — R600 Localized edema: Secondary | ICD-10-CM | POA: Insufficient documentation

## 2023-07-09 MED ORDER — TIRZEPATIDE 2.5 MG/0.5ML ~~LOC~~ SOAJ
2.5000 mg | SUBCUTANEOUS | 1 refills | Status: DC
Start: 2023-07-09 — End: 2023-09-05

## 2023-07-09 NOTE — Assessment & Plan Note (Addendum)
 Diabetes managed with Ozempic . Blood sugar levels generally well-controlled. Last A1c was 7.6%. Goal is fasting levels 100-120 mg/dL.  - Order fasting labs to check A1c and microalbumin levels. -Check insurance coverage for Mounjaro . -Switch to Mounjaro  if insurance approves. -Continue Ozempic  until Mounjaro  is approved. - Diabetic foot examination normal.

## 2023-07-09 NOTE — Assessment & Plan Note (Signed)
 Difficulty losing weight despite physical activity and 2 mg Ozempic  weekly. -Refer to weight management clinic for dietary and exercise guidance. -Check insurance coverage for Mounjaro . -Switch to Mounjaro  if insurance approves. -Continue Ozempic  until Mounjaro  is approved.

## 2023-07-09 NOTE — Assessment & Plan Note (Addendum)
 Bilateral lower extremity edema, left  more than right. -Continue furosemide  daily.

## 2023-07-09 NOTE — Assessment & Plan Note (Signed)
 ccasional dizziness with fluid in right ear. -Prescribe ear drops for fluid in the right ear. -Consider ENT referral if symptoms persist.

## 2023-07-10 ENCOUNTER — Telehealth: Payer: Self-pay

## 2023-07-10 ENCOUNTER — Encounter (INDEPENDENT_AMBULATORY_CARE_PROVIDER_SITE_OTHER): Payer: Self-pay

## 2023-07-10 DIAGNOSIS — F411 Generalized anxiety disorder: Secondary | ICD-10-CM | POA: Diagnosis not present

## 2023-07-10 NOTE — Telephone Encounter (Signed)
 Pharmacy Patient Advocate Encounter   Received notification from CoverMyMeds that prior authorization for Mounjaro  2.5MG /0.5ML auto-injectors is required/requested.   Insurance verification completed.   The patient is insured through [facility] .   Per test claim: PA required; PA submitted to above mentioned insurance via CoverMyMeds Key/confirmation #/EOC [id number] Status is pending

## 2023-07-11 ENCOUNTER — Other Ambulatory Visit (HOSPITAL_COMMUNITY): Payer: Self-pay

## 2023-07-11 NOTE — Telephone Encounter (Signed)
 Pharmacy Patient Advocate Encounter  Received notification from [facility] that Prior Authorization for Mounjaro  2.5MG /0.5ML auto-injectors  has been APPROVED from 07/10/23 to 07/09/24. Ran test claim, Copay is $4. This test claim was processed through [facility]- copay amounts may vary at other pharmacies due to pharmacy/plan contracts, or as the patient moves through the different stages of their insurance plan.   PA #/Case ID/Reference #: [id number]

## 2023-08-01 DIAGNOSIS — Z4432 Encounter for fitting and adjustment of external left breast prosthesis: Secondary | ICD-10-CM | POA: Diagnosis not present

## 2023-08-01 DIAGNOSIS — D0511 Intraductal carcinoma in situ of right breast: Secondary | ICD-10-CM | POA: Diagnosis not present

## 2023-08-01 DIAGNOSIS — Z4431 Encounter for fitting and adjustment of external right breast prosthesis: Secondary | ICD-10-CM | POA: Diagnosis not present

## 2023-08-01 DIAGNOSIS — Z9013 Acquired absence of bilateral breasts and nipples: Secondary | ICD-10-CM | POA: Diagnosis not present

## 2023-08-07 ENCOUNTER — Other Ambulatory Visit: Payer: Self-pay | Admitting: Nurse Practitioner

## 2023-08-07 DIAGNOSIS — E119 Type 2 diabetes mellitus without complications: Secondary | ICD-10-CM

## 2023-08-08 DIAGNOSIS — H5203 Hypermetropia, bilateral: Secondary | ICD-10-CM | POA: Diagnosis not present

## 2023-08-08 DIAGNOSIS — H52223 Regular astigmatism, bilateral: Secondary | ICD-10-CM | POA: Diagnosis not present

## 2023-08-08 DIAGNOSIS — H2513 Age-related nuclear cataract, bilateral: Secondary | ICD-10-CM | POA: Diagnosis not present

## 2023-08-08 DIAGNOSIS — E119 Type 2 diabetes mellitus without complications: Secondary | ICD-10-CM | POA: Diagnosis not present

## 2023-08-11 ENCOUNTER — Other Ambulatory Visit: Payer: Self-pay | Admitting: Nurse Practitioner

## 2023-08-11 DIAGNOSIS — G4733 Obstructive sleep apnea (adult) (pediatric): Secondary | ICD-10-CM

## 2023-08-14 ENCOUNTER — Ambulatory Visit: Admitting: Podiatry

## 2023-08-14 ENCOUNTER — Encounter: Payer: Self-pay | Admitting: Podiatry

## 2023-08-14 VITALS — Ht 63.0 in | Wt 313.9 lb

## 2023-08-14 DIAGNOSIS — M216X1 Other acquired deformities of right foot: Secondary | ICD-10-CM

## 2023-08-14 DIAGNOSIS — F411 Generalized anxiety disorder: Secondary | ICD-10-CM | POA: Diagnosis not present

## 2023-08-14 NOTE — Progress Notes (Signed)
   Chief Complaint  Patient presents with   Diabetes    Pt is here for diabetic foot exam.    HPI: 53 y.o. female presenting today for routine diabetic foot exam.  No new complaints.  Past Medical History:  Diagnosis Date   DCIS (ductal carcinoma in situ) of breast    Diabetes mellitus without complication (HCC)    Family history of bladder cancer    Family history of uterine cancer    High cholesterol    Neuromuscular disorder (HCC)    peripheral neuropathy   PONV (postoperative nausea and vomiting)    Vaginal Pap smear, abnormal     Past Surgical History:  Procedure Laterality Date   BREAST BIOPSY Right 03/08/2021   right breast bx coil clip -positive   BREAST BIOPSY Right 03/24/2021   stereo biopsy x clip/DUCTAL CARCINOMA IN SITU (DCIS),   BREAST BIOPSY Left 05/18/2021   ribbon clip   BREAST BIOPSY Right 05/18/2021   coil clip   COLONOSCOPY WITH PROPOFOL  N/A 03/08/2022   Procedure: COLONOSCOPY WITH PROPOFOL ;  Surgeon: Therisa Bi, MD;  Location: [facility] ENDOSCOPY;  Service: Gastroenterology;  Laterality: N/A;  REQUESTED LAST CASE   CRYOTHERAPY     EVACUATION BREAST HEMATOMA Right 06/16/2021   Procedure: EVACUATION HEMATOMA BREAST;  Surgeon: Lane Shope, MD;  Location: [facility] ORS;  Service: General;  Laterality: Right;   MASTECTOMY     SIMPLE MASTECTOMY WITH AXILLARY SENTINEL NODE BIOPSY Right 06/15/2021   Procedure: SIMPLE MASTECTOMY WITH AXILLARY SENTINEL NODE BIOPSY, Total;  Surgeon: Lane Shope, MD;  Location: [facility] ORS;  Service: General;  Laterality: Right;   TOTAL MASTECTOMY Left 06/15/2021   Procedure: TOTAL MASTECTOMY, prophylactic left;  Surgeon: Lane Shope, MD;  Location: [facility] ORS;  Service: General;  Laterality: Left;   WRIST SURGERY      Allergies  Allergen Reactions   Codeine Itching and Anaphylaxis     Physical Exam: General: The patient is alert and oriented x3 in no acute distress.  Dermatology: Skin is warm, dry and supple bilateral  lower extremities. Negative for open lesions or macerations.  Vascular: Palpable pedal pulses bilaterally. Capillary refill within normal limits.  Chronic bilateral lower extremity edema noted  Neurological: Light touch and protective threshold grossly intact  Musculoskeletal Exam: No pedal deformities noted  Assessment: 1.  Diabetes mellitus last A1c on 03/20/2023 was 7.6 2.  Chronic bilateral lower extremity edema  Plan of Care:  -Patient evaluated. X-Rays reviewed.  -Comprehensive diabetic foot exam performed today -Recommend compression hose daily to the bilateral lower extremity.  The patient has compression hose at home but does not wear them currently -Continue Lasix  as per prescribing PCP -Recommend good supportive shoes and sneakers.  Advised against going barefoot  -Return to clinic annually      Thresa EMERSON Sar, DPM [facility]  Dr. Thresa EMERSON Sar, DPM    [address] [place], [place] [address]                Office [phone]  Fax [phone]

## 2023-08-20 ENCOUNTER — Telehealth: Payer: Self-pay

## 2023-08-20 NOTE — Telephone Encounter (Signed)
 Copied from CRM [id number]. Topic: Referral - Request for Referral >> Aug 20, 2023 11:28 AM Maisie BROCKS wrote: Did the patient discuss referral with their provider in the last year? Yes  Appointment offered? Yes  Type of order/referral and detailed reason for visit: ENT, having allergy issues in ear, wants allergen test   Preference of office, provider, location: local  If referral order, have you been seen by this specialty before? No (If Yes, this issue or another issue? When? Where?  Can we respond through MyChart? Yes

## 2023-08-23 ENCOUNTER — Other Ambulatory Visit: Payer: Self-pay

## 2023-08-23 DIAGNOSIS — R42 Dizziness and giddiness: Secondary | ICD-10-CM

## 2023-08-23 NOTE — Telephone Encounter (Unsigned)
 Copied from CRM [id number]. Topic: Referral - Question >> Aug 23, 2023  1:14 PM Burnard DEL wrote: Reason for CRM: Patient  is requesting a phone call regarding referral to ENT.Patient has called 7 different times in regards to this referral and is still not getting an answer . Patient would like a phone call today with an update on this referral.

## 2023-08-23 NOTE — Telephone Encounter (Signed)
 Referral has been placed for Pt. Pt seen in June 2025 per Vincente NP  -Consider ENT referral if symptoms persist. Left message to return call to our office.  Please advise pt that referral has been sent.

## 2023-08-23 NOTE — Telephone Encounter (Signed)
 Pt stated that she has called 7 times and and it was only document 2 times. Pt has date and times that she has called about the referral

## 2023-08-23 NOTE — Telephone Encounter (Unsigned)
 Copied from CRM [id number]. Topic: General - Call Back - No Documentation >> Aug 23, 2023  2:05 PM Rea ORN wrote: Reason for CRM: Pt returning call to [facility]. Please return call, [phone], regarding ENT referral.

## 2023-08-24 ENCOUNTER — Encounter (INDEPENDENT_AMBULATORY_CARE_PROVIDER_SITE_OTHER): Payer: Self-pay

## 2023-08-27 NOTE — Addendum Note (Signed)
 Addended by: FROYLAN SOR on: 08/27/2023 09:11 AM   Modules accepted: Orders

## 2023-09-05 ENCOUNTER — Encounter: Payer: Self-pay | Admitting: Dermatology

## 2023-09-05 ENCOUNTER — Other Ambulatory Visit: Payer: Self-pay | Admitting: Nurse Practitioner

## 2023-09-05 ENCOUNTER — Other Ambulatory Visit: Payer: Self-pay | Admitting: Cardiology

## 2023-09-05 DIAGNOSIS — G4733 Obstructive sleep apnea (adult) (pediatric): Secondary | ICD-10-CM

## 2023-09-05 DIAGNOSIS — I872 Venous insufficiency (chronic) (peripheral): Secondary | ICD-10-CM

## 2023-09-05 DIAGNOSIS — L219 Seborrheic dermatitis, unspecified: Secondary | ICD-10-CM

## 2023-09-05 DIAGNOSIS — I480 Paroxysmal atrial fibrillation: Secondary | ICD-10-CM

## 2023-09-05 MED ORDER — KETOCONAZOLE 2 % EX CREA: TOPICAL_CREAM | CUTANEOUS | 5 refills | Status: AC

## 2023-09-05 MED ORDER — HYDROCORTISONE 2.5 % EX CREA: TOPICAL_CREAM | CUTANEOUS | 11 refills | Status: AC

## 2023-09-05 MED ORDER — MOMETASONE FUROATE 0.1 % EX CREA: TOPICAL_CREAM | CUTANEOUS | 6 refills | Status: AC

## 2023-09-05 NOTE — Telephone Encounter (Signed)
 Eliquis  5mg  refill request received. Patient is 53 years old, weight-142.4kg, Crea-0.78 on 06/18/23, Diagnosis-Afib, and last seen by Elvie Needle on 11/29/22. Dose is appropriate based on dosing criteria. Will send in refill to requested pharmacy.

## 2023-09-06 ENCOUNTER — Encounter: Payer: Self-pay | Admitting: Cardiology

## 2023-09-06 ENCOUNTER — Ambulatory Visit: Attending: Cardiology | Admitting: Cardiology

## 2023-09-06 VITALS — BP 104/80 | HR 75 | Ht 63.0 in | Wt 322.0 lb

## 2023-09-06 DIAGNOSIS — I471 Supraventricular tachycardia, unspecified: Secondary | ICD-10-CM | POA: Diagnosis not present

## 2023-09-06 DIAGNOSIS — D6869 Other thrombophilia: Secondary | ICD-10-CM | POA: Insufficient documentation

## 2023-09-06 DIAGNOSIS — I480 Paroxysmal atrial fibrillation: Secondary | ICD-10-CM | POA: Diagnosis not present

## 2023-09-06 NOTE — Progress Notes (Signed)
 Electrophysiology Clinic Note    Date:  09/06/2023  Patient ID:  Heather Cain, Heather Cain 1970/04/18, MRN [id number] PCP:  Vincente Saber, NP  Cardiologist:  None   Electrophysiology APP:  Izumi Mixon, NP    Discussed the use of AI scribe software for clinical note transcription with the patient, who gave verbal consent to proceed.   Patient Profile    Chief Complaint: Afib, SVT follow-up  History of Present Illness: Heather Cain is a 53 y.o. female with PMH notable for parox AFib, SVT, OSA, T2DM, HLD, anxiety, lower leg edema, breast cancer s/p bilat mastectomy ; seen today for routine electrophysiology followup.   I last saw her 11/2022 where she was having brief <31min palpitation episodes, tolerating 25mg  metop well. We had previously increased metop to 50mg  and she had not tolerated it.   On follow-up today, she has noticed an increase in palpitation episodes, they last a few minutes or less. She initially thought it was maybe her anxiety, but she recently had an episode that occurred while she was resting and watching TV when she was very calm. Also has intermittent dizziness with lightheadedness with palpitation episodes.    She continues to take toprol  25mg  daily, and eliquis  BID, no bleeding concerns.     Arrhythmia/Device History No specialty comments available.    ROS:  Please see the history of present illness. All other systems are reviewed and otherwise negative.    Physical Exam    VS:  BP 104/80 (BP Location: Left Arm, Patient Position: Sitting, Cuff Size: Large)   Pulse 75   Ht 5' 3 (1.6 m)   Wt (!) 322 lb (146.1 kg)   SpO2 99%   BMI 57.04 kg/m  BMI: Body mass index is 57.04 kg/m.      Wt Readings from Last 3 Encounters:  09/06/23 (!) 322 lb (146.1 kg)  08/14/23 (!) 313 lb 15 oz (142.4 kg)  06/17/23 (!) 313 lb 15 oz (142.4 kg)     GEN- The patient is well appearing, alert and oriented x 3 today.   Lungs- Clear to ausculation  bilaterally, normal work of breathing.  Heart- Regular rate and rhythm, no murmurs, rubs or gallops Extremities- Trace peripheral edema, warm, dry    Studies Reviewed   Previous EP, cardiology notes.    EKG is ordered. Personal review of EKG from today shows:    EKG Interpretation Date/Time:  Thursday September 06 2023 14:23:49 EDT Ventricular Rate:  75 PR Interval:  182 QRS Duration:  84 QT Interval:  404 QTC Calculation: 451 R Axis:   -15  Text Interpretation: Normal sinus rhythm Low voltage QRS Confirmed by Koby Pickup [phone]) on 09/06/2023 2:34:26 PM    TTE, 09/20/2022  1. Left ventricular ejection fraction, by estimation, is 60 to 65%. The left ventricle has normal function. The left ventricle has no regional wall motion abnormalities. Left ventricular diastolic parameters were normal. The average left ventricular global longitudinal strain is -23.3 %.   2. Right ventricular systolic function is normal. The right ventricular size is normal. Tricuspid regurgitation signal is inadequate for assessing PA pressure.   3. The mitral valve is normal in structure. Mild mitral valve regurgitation. No evidence of mitral stenosis.   4. The aortic valve has an indeterminant number of cusps. Aortic valve regurgitation is not visualized. No aortic stenosis is present.   5. The inferior vena cava is normal in size with greater than 50% respiratory variability, suggesting right  atrial pressure of 3 mmHg.   Long term monitor, 08/25/2022 HR 61 - 250 bpm, average 92 bpm. 506 SVT episodes, longest 13.7 seconds with an average HR 145 bpm. <1% burden of AF, longest episode 44 min 14 sec with an average rate 102 bpm. Frequent supraventricular ectopy, 10.6%. Rare ventricular ectopy.   Assessment and Plan     #) parox AFib #) SVT Not ablation candidate d/t elevated BMI Continues to have palpitation episodes on 25mg  toprol  daily. Did not tolerate 25mg  BID We discussed trying alternative BB or  CCB, or adding AAD. At this time, patient would like to continue to monitor episodes. She will notify office if they persis Discussed that it would be OK to take additional 25mg  toprol  PRN  #) Hypercoag d/t afib CHA2DS2-VASc Score = at least 2 [CHF History: 0, HTN History: 0, Diabetes History: 1, Stroke History: 0, Vascular Disease History: 0, Age Score: 0, Gender Score: 1].  Therefore, the patient's annual risk of stroke is 2.2 %.    Stroke ppx - 5mg  eliquis  BID, appropriately dosed No bleeding concerns  #) OSA Continue CPAP      Current medicines are reviewed at length with the patient today.   The patient has concerns regarding her medicines.  The following changes were made today:  none  Labs/ tests ordered today include:  Orders Placed This Encounter  Procedures   EKG 12-Lead     Disposition: Follow up with EP APP  in 12 months, or sooner if needed   Signed, Chaya Dehaan, NP  09/06/23  3:55 PM  Electrophysiology [facility]

## 2023-09-06 NOTE — Patient Instructions (Signed)
 Medication Instructions:  Your physician recommends that you continue on your current medications as directed. Please refer to the Current Medication list given to you today.   *If you need a refill on your cardiac medications before your next appointment, please call your pharmacy*  Lab Work: No labs ordered today  If you have labs (blood work) drawn today and your tests are completely normal, you will receive your results only by: MyChart Message (if you have MyChart) OR A paper copy in the mail If you have any lab test that is abnormal or we need to change your treatment, we will call you to review the results.  Testing/Procedures: No test ordered today   Follow-Up: At [facility], you and your health needs are our priority.  As part of our continuing mission to provide you with exceptional heart care, our providers are all part of one team.  This team includes your primary Cardiologist (physician) and Advanced Practice Providers or APPs (Physician Assistants and Nurse Practitioners) who all work together to provide you with the care you need, when you need it.  Your next appointment:   1 year(s)  Provider:   Suzann Riddle, NP    We recommend signing up for the patient portal called MyChart.  Sign up information is provided on this After Visit Summary.  MyChart is used to connect with patients for Virtual Visits (Telemedicine).  Patients are able to view lab/test results, encounter notes, upcoming appointments, etc.  Non-urgent messages can be sent to your provider as well.   To learn more about what you can do with MyChart, go to [email or web address].

## 2023-09-07 NOTE — Telephone Encounter (Signed)
 Noted

## 2023-09-11 DIAGNOSIS — G5603 Carpal tunnel syndrome, bilateral upper limbs: Secondary | ICD-10-CM | POA: Diagnosis not present

## 2023-09-11 DIAGNOSIS — M65321 Trigger finger, right index finger: Secondary | ICD-10-CM | POA: Diagnosis not present

## 2023-09-19 ENCOUNTER — Ambulatory Visit: Payer: Self-pay

## 2023-09-19 NOTE — Telephone Encounter (Signed)
 FYI Only or Action Required?: FYI only for provider.  Patient was last seen in primary care on 06/15/2023 by Vincente Saber, NP.  Called Nurse Triage reporting Hot Flashes and Dizziness.  Symptoms began a week ago.  Interventions attempted: Nothing.  Symptoms are: gradually worsening.  Triage Disposition: See PCP When Office is Open (Within 3 Days)  Patient/caregiver understands and will follow disposition?: Yes  Copied from CRM [id number]. Topic: Clinical - Red Word Triage >> Sep 19, 2023  2:49 PM Shereese L wrote: [facility] that prompted transfer to Nurse Triage: Extreme hot flashes, dizziness but no fever Reason for Disposition  [1] MODERATE dizziness (e.g., interferes with normal activities) AND [2] has been evaluated by doctor (or NP/PA) for this  Answer Assessment - Initial Assessment Questions 1. DESCRIPTION: Describe your dizziness.     Light headed, sometimes a little spinny 2. LIGHTHEADED: Do you feel lightheaded? (e.g., somewhat faint, woozy, weak upon standing)     Sometimes feels weak, but does not feel like she will pass out 3. VERTIGO: Do you feel like either you or the room is spinning or tilting? (i.e., vertigo)     denies 4. SEVERITY: How bad is it?  Do you feel like you are going to faint? Can you stand and walk?     Able to stand and walk, does not feel like passing out 5. ONSET:  When did the dizziness begin?     months 6. AGGRAVATING FACTORS: Does anything make it worse? (e.g., standing, change in head position)     unsure 7. HEART RATE: Can you tell me your heart rate? How many beats in 15 seconds?  (Note: Not all patients can do this.)        8. CAUSE: What do you think is causing the dizziness? (e.g., decreased fluids or food, diarrhea, emotional distress, heat exposure, new medicine, sudden standing, vomiting; unknown)     Unsure, maybe inner ear 9. RECURRENT SYMPTOM: Have you had dizziness before? If Yes, ask: When was the  last time? What happened that time?     Went to ED and got fluids 10. OTHER SYMPTOMS: Do you have any other symptoms? (e.g., fever, chest pain, vomiting, diarrhea, bleeding)       Hot flashes 11. PREGNANCY: Is there any chance you are pregnant? When was your last menstrual period?       N/A  Protocols used: Dizziness - Lightheadedness-A-AH

## 2023-09-19 NOTE — Telephone Encounter (Signed)
 Pt is scheduled to see Charanpreet on 09/21/2023.

## 2023-09-21 ENCOUNTER — Encounter: Payer: Self-pay | Admitting: Nurse Practitioner

## 2023-09-21 ENCOUNTER — Ambulatory Visit: Admitting: Nurse Practitioner

## 2023-09-21 VITALS — BP 116/72 | HR 79 | Temp 97.4°F | Ht 63.0 in | Wt 316.2 lb

## 2023-09-21 DIAGNOSIS — R42 Dizziness and giddiness: Secondary | ICD-10-CM | POA: Diagnosis not present

## 2023-09-21 DIAGNOSIS — R232 Flushing: Secondary | ICD-10-CM

## 2023-09-21 DIAGNOSIS — R252 Cramp and spasm: Secondary | ICD-10-CM

## 2023-09-21 DIAGNOSIS — H6993 Unspecified Eustachian tube disorder, bilateral: Secondary | ICD-10-CM

## 2023-09-21 DIAGNOSIS — E785 Hyperlipidemia, unspecified: Secondary | ICD-10-CM | POA: Diagnosis not present

## 2023-09-21 DIAGNOSIS — E1169 Type 2 diabetes mellitus with other specified complication: Secondary | ICD-10-CM | POA: Diagnosis not present

## 2023-09-21 NOTE — Patient Instructions (Addendum)
Black cohosh for menopausal symptoms

## 2023-09-21 NOTE — Progress Notes (Signed)
 Established Patient Office Visit  Subjective:  Patient ID: Heather Cain, female    DOB: 29-Apr-1970  Age: 53 y.o. MRN: [id number]  CC:  Chief Complaint  Patient presents with   Dizziness   Hot Flashes   Discussed the use of a AI scribe software for clinical note transcription with the patient, who gave verbal consent to proceed.  HPI Heather Cain is a 53 year old female who presents with sudden onset hot flashes and muscle cramps.  She experiences sudden onset hot flashes since last Wednesday, occurring almost hourly and disrupting sleep. The sensation is described as being 'on fire and sweating.' She initially considered cortisone shots as a cause, but was informed side effects should resolve within 72 hours. No herbal supplements have been tried for relief.  Muscle cramps occur in her left toes and calves, including during activities like kayaking. She consumes a sugar-free electrolyte drink daily and takes a magnesium supplement, but cramps persist.  Intermittent dizziness occurs more with movement, accompanied by ear pressure. It feels similar to past inner ear issues. An ENT appointment is scheduled for the end of the month. No vision changes, nausea, or vomiting are present.  Anxiety is well-controlled with weekly therapy sessions, with no recent panic attacks, though hot flashes cause stress and anxiety.  She has irregular menstrual cycles, with the last cycle in January and only one period per year for the past three years. She is unsure if symptoms are menopause-related.  Thyroid  levels were normal at the last check, with lab work scheduled for early October.  HPI   Past Medical History:  Diagnosis Date   DCIS (ductal carcinoma in situ) of breast    Diabetes mellitus without complication (HCC)    Family history of bladder cancer    Family history of uterine cancer    High cholesterol    Neuromuscular disorder (HCC)    peripheral neuropathy   PONV (postoperative  nausea and vomiting)    Vaginal Pap smear, abnormal     Past Surgical History:  Procedure Laterality Date   BREAST BIOPSY Right 03/08/2021   right breast bx coil clip -positive   BREAST BIOPSY Right 03/24/2021   stereo biopsy x clip/DUCTAL CARCINOMA IN SITU (DCIS),   BREAST BIOPSY Left 05/18/2021   ribbon clip   BREAST BIOPSY Right 05/18/2021   coil clip   COLONOSCOPY WITH PROPOFOL  N/A 03/08/2022   Procedure: COLONOSCOPY WITH PROPOFOL ;  Surgeon: Therisa Bi, MD;  Location: [facility] ENDOSCOPY;  Service: Gastroenterology;  Laterality: N/A;  REQUESTED LAST CASE   CRYOTHERAPY     EVACUATION BREAST HEMATOMA Right 06/16/2021   Procedure: EVACUATION HEMATOMA BREAST;  Surgeon: Lane Shope, MD;  Location: [facility] ORS;  Service: General;  Laterality: Right;   MASTECTOMY     SIMPLE MASTECTOMY WITH AXILLARY SENTINEL NODE BIOPSY Right 06/15/2021   Procedure: SIMPLE MASTECTOMY WITH AXILLARY SENTINEL NODE BIOPSY, Total;  Surgeon: Lane Shope, MD;  Location: [facility] ORS;  Service: General;  Laterality: Right;   TOTAL MASTECTOMY Left 06/15/2021   Procedure: TOTAL MASTECTOMY, prophylactic left;  Surgeon: Lane Shope, MD;  Location: [facility] ORS;  Service: General;  Laterality: Left;   WRIST SURGERY      Family History  Problem Relation Age of Onset   Uterine cancer Mother 94   Bladder Cancer Father 84   Skin cancer Maternal Aunt    Cancer - Other Maternal Grandfather        possible colon cancer   Breast cancer Maternal Great-grandmother  Social History   Socioeconomic History   Marital status: Single    Spouse name: Not on file   Number of children: Not on file   Years of education: Not on file   Highest education level: Some college, no degree  Occupational History   Not on file  Tobacco Use   Smoking status: Former    Current packs/day: 0.00    Types: Cigarettes    Quit date: 05/30/2021    Years since quitting: 2.3   Smokeless tobacco: Never  Vaping Use   Vaping status: Some  Days  Substance and Sexual Activity   Alcohol use: Yes    Comment: social   Drug use: Yes    Types: Marijuana   Sexual activity: Not Currently    Birth control/protection: None  Other Topics Concern   Not on file  Social History Narrative   Lives in [place]; self; no children; 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 former Smoke ; rare alcohol.    Social Drivers of Health   Financial Resource Strain: Medium Risk (09/20/2023)   Overall Financial Resource Strain (CARDIA)    Difficulty of Paying Living Expenses: Somewhat hard  Food Insecurity: Food Insecurity Present (09/20/2023)   Hunger Vital Sign    Worried About Running Out of Food in the Last Year: Sometimes true    Ran Out of Food in the Last Year: Sometimes true  Transportation Needs: No Transportation Needs (09/20/2023)   PRAPARE - Administrator, Civil Service (Medical): No    Lack of Transportation (Non-Medical): No  Physical Activity: Sufficiently Active (09/20/2023)   Exercise Vital Sign    Days of Exercise per Week: 3 days    Minutes of Exercise per Session: 60 min  Stress: No Stress Concern Present (09/20/2023)   [employer] of Occupational Health - Occupational Stress Questionnaire    Feeling of Stress: Only a little  Social Connections: Socially Isolated (09/20/2023)   Social Connection and Isolation Panel    Frequency of Communication with Friends and Family: More than three times a week    Frequency of Social Gatherings with Friends and Family: Once a week    Attends Religious Services: Never    Database administrator or Organizations: No    Attends Engineer, structural: Not on file    Marital Status: Never married  Intimate Partner Violence: Not At Risk (03/16/2022)   Humiliation, Afraid, Rape, and Kick questionnaire    Fear of Current or Ex-Partner: No    Emotionally Abused: No    Physically Abused: No    Sexually Abused: No     Outpatient Medications Prior to Visit  Medication Sig Dispense Refill    albuterol  (VENTOLIN  HFA) 108 (90 Base) MCG/ACT inhaler Inhale 2 puffs into the lungs every 6 (six) hours as needed for wheezing or shortness of breath. 8 g 2   apixaban  (ELIQUIS ) 5 MG TABS tablet Take 1 tablet by mouth twice daily 180 tablet 1   Dietary Management Product (VASCULERA) TABS Take 1 tab po qd 30 tablet 6   gabapentin  (NEURONTIN ) 100 MG capsule 200 mg 2 (two) times daily.     glimepiride  (AMARYL ) 4 MG tablet Take 1 tablet (4 mg total) by mouth daily before breakfast. 90 tablet 3   glucose blood (ACCU-CHEK GUIDE TEST) test strip USE AS DIRECTED 50 each 1   hydrocortisone  2.5 % cream apply to face once a day Tues, Thurs, Sat 30 g 11   ketoconazole  (NIZORAL ) 2 % cream Apply to  aa's face QD on Monday, Wednesday, and Friday. 60 g 5   loratadine  (CLARITIN ) 10 MG tablet Take 10 mg by mouth daily.     meclizine  (ANTIVERT ) 25 MG tablet Take 1 tablet (25 mg total) by mouth 3 (three) times daily as needed. 30 tablet 0   metoprolol  succinate (TOPROL -XL) 25 MG 24 hr tablet TAKE 1 TABLET BY MOUTH AT BEDTIME 90 tablet 3   mometasone  (ELOCON ) 0.1 % cream apply to legs 5 nights a week 45 g 6   NEOMYCIN -POLYMYXIN-HYDROCORTISONE  (CORTISPORIN) 1 % SOLN OTIC solution 4 gtt in affected ear(s) tid, max of 10 days. Lie with affected ear upward x 5 minutes 10 mL 0   ondansetron  (ZOFRAN -ODT) 4 MG disintegrating tablet Take 1 tablet (4 mg total) by mouth every 6 (six) hours as needed for nausea or vomiting. 20 tablet 0   rosuvastatin  (CRESTOR ) 20 MG tablet Take 1 tablet (20 mg total) by mouth daily. 90 tablet 3   Salicylic Acid (NIZORAL  PSORIASIS SHAMPOO/COND) 3 % SHAM Apply topically.     Accu-Chek Softclix Lancets lancets Use as instructed 100 each 4   fluticasone  (FLONASE ) 50 MCG/ACT nasal spray Use 2 spray(s) in each nostril once daily 16 g 0   furosemide  (LASIX ) 20 MG tablet Take 1 tablet by mouth once daily 30 tablet 0   MOUNJARO  2.5 MG/0.5ML Pen INJECT 2.5 MG SUBCUTANEOUSLY ONCE A WEEK 4 mL 0   No  facility-administered medications prior to visit.    Allergies  Allergen Reactions   Codeine Itching and Anaphylaxis    ROS Review of Systems Negative unless indicated in HPI.    Objective:    Physical Exam Constitutional:      Appearance: Normal appearance.  HENT:     Right Ear: A middle ear effusion is present.     Left Ear:  No middle ear effusion.     Mouth/Throat:     Mouth: Mucous membranes are moist.  Eyes:     Conjunctiva/sclera: Conjunctivae normal.     Pupils: Pupils are equal, round, and reactive to light.  Cardiovascular:     Rate and Rhythm: Normal rate and regular rhythm.     Pulses: Normal pulses.     Heart sounds: Normal heart sounds.  Pulmonary:     Effort: Pulmonary effort is normal.     Breath sounds: Normal breath sounds.  Musculoskeletal:     Cervical back: Normal range of motion. No tenderness.  Skin:    General: Skin is warm.     Findings: No bruising.  Neurological:     General: No focal deficit present.     Mental Status: She is alert and oriented to person, place, and time. Mental status is at baseline.  Psychiatric:        Mood and Affect: Mood normal.        Behavior: Behavior normal.        Thought Content: Thought content normal.        Judgment: Judgment normal.     BP 116/72   Pulse 79   Temp (!) 97.4 F (36.3 C)   Ht 5' 3 (1.6 m)   Wt (!) 316 lb 3.2 oz (143.4 kg)   SpO2 97%   BMI 56.01 kg/m  Wt Readings from Last 3 Encounters:  09/21/23 (!) 316 lb 3.2 oz (143.4 kg)  09/06/23 (!) 322 lb (146.1 kg)  08/14/23 (!) 313 lb 15 oz (142.4 kg)     Health Maintenance  Topic Date Due  HIV Screening  Never done   Hepatitis C Screening  Never done   Pneumococcal Vaccine: 50+ Years (1 of 2 - PCV) Never done   Hepatitis B Vaccines 19-59 Average Risk (1 of 3 - 19+ 3-dose series) Never done   OPHTHALMOLOGY EXAM  08/04/2023   COVID-19 Vaccine (5 - 2025-26 season) 09/10/2023   Influenza Vaccine  04/08/2024 (Originally 08/10/2023)    HEMOGLOBIN A1C  03/20/2024   Diabetic kidney evaluation - Urine ACR  06/14/2024   FOOT EXAM  06/14/2024   Diabetic kidney evaluation - eGFR measurement  06/17/2024   Colonoscopy  03/08/2025   Cervical Cancer Screening (HPV/Pap Cotest)  04/16/2028   DTaP/Tdap/Td (3 - Td or Tdap) 02/11/2032   Zoster Vaccines- Shingrix   Completed   HPV VACCINES  Aged Out   Meningococcal B Vaccine  Aged Out   Mammogram  Discontinued       Topic Date Due   Hepatitis B Vaccines 19-59 Average Risk (1 of 3 - 19+ 3-dose series) Never done    Lab Results  Component Value Date   TSH 2.490 09/21/2023   Lab Results  Component Value Date   WBC 6.1 06/18/2023   HGB 13.0 06/18/2023   HCT 39.4 06/18/2023   MCV 92.5 06/18/2023   PLT 174 06/18/2023   Lab Results  Component Value Date   NA 138 06/18/2023   K 3.1 (L) 06/18/2023   CO2 25 06/18/2023   GLUCOSE 154 (H) 06/18/2023   BUN 15 06/18/2023   CREATININE 0.78 06/18/2023   BILITOT 0.7 06/18/2023   ALKPHOS 63 06/18/2023   AST 32 06/18/2023   ALT 25 06/18/2023   PROT 6.4 (L) 06/18/2023   ALBUMIN  3.7 06/18/2023   CALCIUM  9.0 06/18/2023   ANIONGAP 8 06/18/2023   EGFR 91 12/15/2022   GFR 75.41 04/05/2023   Lab Results  Component Value Date   CHOL 128 09/21/2023   Lab Results  Component Value Date   HDL 47 09/21/2023   Lab Results  Component Value Date   LDLCALC 46 09/21/2023   Lab Results  Component Value Date   TRIG 221 (H) 09/21/2023   Lab Results  Component Value Date   CHOLHDL 2.7 09/21/2023   Lab Results  Component Value Date   HGBA1C 7.4 (H) 09/21/2023      Assessment & Plan:  Cramp of extremity -     Magnesium  Hyperlipidemia, unspecified hyperlipidemia type -     Lipid panel  Type 2 diabetes mellitus with other specified complication, without long-term current use of insulin  (HCC) -     Hemoglobin A1c  Hot flashes Assessment & Plan: Frequent hot flashes disrupting sleep, likely due to menopausal transition.  Thyroid  dysfunction to be ruled out. - Check TSH levels. - Recommend black cohosh supplement.  Orders: -     TSH Rfx on Abnormal to Free T4  Dizziness Assessment & Plan: Intermittent dizziness likely related to eustachian tube dysfunction. ENT evaluation pending   Dysfunction of both eustachian tubes Assessment & Plan: Persistent ear pressure and fluid causing dizziness. - Restart Ciprodex ear drops. - Contact ENT for earlier appointment.     Follow-up: Return in about 4 months (around 01/21/2024) for chronic management.   Xxavier Noon, NP

## 2023-09-22 LAB — LIPID PANEL
Chol/HDL Ratio: 2.7 ratio (ref 0.0–4.4)
Cholesterol, Total: 128 mg/dL (ref 100–199)
HDL: 47 mg/dL (ref >39)
LDL Chol Calc (NIH): 46 mg/dL (ref 0–99)
Triglycerides: 221 mg/dL — ABNORMAL HIGH (ref 0–149)
VLDL Cholesterol Cal: 35 mg/dL (ref 5–40)

## 2023-09-22 LAB — MAGNESIUM: Magnesium: 2.2 mg/dL (ref 1.6–2.3)

## 2023-09-22 LAB — HEMOGLOBIN A1C
Est. average glucose Bld gHb Est-mCnc: 166 mg/dL
Hgb A1c MFr Bld: 7.4 % — ABNORMAL HIGH (ref 4.8–5.6)

## 2023-09-22 LAB — TSH RFX ON ABNORMAL TO FREE T4: TSH: 2.49 u[IU]/mL (ref 0.450–4.500)

## 2023-09-25 ENCOUNTER — Other Ambulatory Visit: Payer: Self-pay | Admitting: Nurse Practitioner

## 2023-09-25 DIAGNOSIS — G4733 Obstructive sleep apnea (adult) (pediatric): Secondary | ICD-10-CM

## 2023-09-25 DIAGNOSIS — E119 Type 2 diabetes mellitus without complications: Secondary | ICD-10-CM

## 2023-09-26 ENCOUNTER — Encounter: Payer: Self-pay | Admitting: Nurse Practitioner

## 2023-09-27 ENCOUNTER — Ambulatory Visit: Payer: Self-pay | Admitting: Nurse Practitioner

## 2023-09-30 ENCOUNTER — Other Ambulatory Visit: Payer: Self-pay | Admitting: Nurse Practitioner

## 2023-10-03 NOTE — Telephone Encounter (Signed)
 Noted

## 2023-10-08 DIAGNOSIS — H93291 Other abnormal auditory perceptions, right ear: Secondary | ICD-10-CM | POA: Diagnosis not present

## 2023-10-08 DIAGNOSIS — H6993 Unspecified Eustachian tube disorder, bilateral: Secondary | ICD-10-CM | POA: Insufficient documentation

## 2023-10-08 DIAGNOSIS — R232 Flushing: Secondary | ICD-10-CM | POA: Insufficient documentation

## 2023-10-08 DIAGNOSIS — R42 Dizziness and giddiness: Secondary | ICD-10-CM | POA: Diagnosis not present

## 2023-10-08 DIAGNOSIS — H6981 Other specified disorders of Eustachian tube, right ear: Secondary | ICD-10-CM | POA: Diagnosis not present

## 2023-10-08 DIAGNOSIS — J301 Allergic rhinitis due to pollen: Secondary | ICD-10-CM | POA: Diagnosis not present

## 2023-10-08 NOTE — Assessment & Plan Note (Signed)
 Persistent ear pressure and fluid causing dizziness. - Restart Ciprodex ear drops. - Contact ENT for earlier appointment.

## 2023-10-08 NOTE — Assessment & Plan Note (Signed)
 Frequent hot flashes disrupting sleep, likely due to menopausal transition. Thyroid  dysfunction to be ruled out. - Check TSH levels. - Recommend black cohosh supplement.

## 2023-10-08 NOTE — Assessment & Plan Note (Signed)
 Intermittent dizziness likely related to eustachian tube dysfunction. ENT evaluation pending

## 2023-10-10 DIAGNOSIS — J301 Allergic rhinitis due to pollen: Secondary | ICD-10-CM | POA: Diagnosis not present

## 2023-10-16 ENCOUNTER — Other Ambulatory Visit

## 2023-10-19 ENCOUNTER — Ambulatory Visit: Admitting: Nurse Practitioner

## 2023-10-20 ENCOUNTER — Other Ambulatory Visit: Payer: Self-pay | Admitting: Nurse Practitioner

## 2023-10-22 ENCOUNTER — Other Ambulatory Visit: Payer: Self-pay | Admitting: Nurse Practitioner

## 2023-10-22 DIAGNOSIS — E119 Type 2 diabetes mellitus without complications: Secondary | ICD-10-CM

## 2023-10-23 ENCOUNTER — Other Ambulatory Visit: Payer: Self-pay | Admitting: Nurse Practitioner

## 2023-10-23 MED ORDER — TIRZEPATIDE 5 MG/0.5ML ~~LOC~~ SOAJ: 5.0000 mg | SUBCUTANEOUS | 1 refills | Status: DC

## 2023-10-23 NOTE — Telephone Encounter (Signed)
 Please inform pt we have increased the dose and Mounjaro  5 mg has been sent .

## 2023-10-29 ENCOUNTER — Other Ambulatory Visit: Payer: Self-pay | Admitting: Nurse Practitioner

## 2023-10-29 NOTE — Telephone Encounter (Signed)
 Lov  09/21/23  nov  01/24/24

## 2023-11-01 ENCOUNTER — Other Ambulatory Visit: Payer: Self-pay | Admitting: Nurse Practitioner

## 2023-11-01 DIAGNOSIS — G4733 Obstructive sleep apnea (adult) (pediatric): Secondary | ICD-10-CM

## 2023-11-06 DIAGNOSIS — E119 Type 2 diabetes mellitus without complications: Secondary | ICD-10-CM | POA: Diagnosis not present

## 2023-11-06 DIAGNOSIS — F419 Anxiety disorder, unspecified: Secondary | ICD-10-CM | POA: Diagnosis not present

## 2023-11-06 DIAGNOSIS — Z01818 Encounter for other preprocedural examination: Secondary | ICD-10-CM | POA: Diagnosis not present

## 2023-11-06 DIAGNOSIS — I471 Supraventricular tachycardia, unspecified: Secondary | ICD-10-CM | POA: Diagnosis not present

## 2023-11-06 DIAGNOSIS — R200 Anesthesia of skin: Secondary | ICD-10-CM | POA: Diagnosis not present

## 2023-11-06 DIAGNOSIS — I480 Paroxysmal atrial fibrillation: Secondary | ICD-10-CM | POA: Diagnosis not present

## 2023-11-06 DIAGNOSIS — G629 Polyneuropathy, unspecified: Secondary | ICD-10-CM | POA: Diagnosis not present

## 2023-11-06 DIAGNOSIS — D649 Anemia, unspecified: Secondary | ICD-10-CM | POA: Diagnosis not present

## 2023-11-06 DIAGNOSIS — M65321 Trigger finger, right index finger: Secondary | ICD-10-CM | POA: Diagnosis not present

## 2023-11-06 DIAGNOSIS — R202 Paresthesia of skin: Secondary | ICD-10-CM | POA: Diagnosis not present

## 2023-11-09 ENCOUNTER — Telehealth: Payer: Self-pay

## 2023-11-09 MED ORDER — FREESTYLE LIBRE 3 SENSOR MISC: 3 refills | Status: DC

## 2023-11-09 NOTE — Telephone Encounter (Signed)
 Freestyle sent in to [facility]

## 2023-11-09 NOTE — Telephone Encounter (Unsigned)
 Copied from CRM #[id number]. Topic: Clinical - Medication Question >> Nov 09, 2023  2:43 PM [name] wrote: Reason for CRM: Following up on her wearable glucose monitor

## 2023-11-09 NOTE — Addendum Note (Signed)
 Addended by: Bynum Mccullars on: 11/09/2023 03:34 PM   Modules accepted: Orders

## 2023-11-09 NOTE — Telephone Encounter (Signed)
 Copied from CRM #[id number]. Topic: General - Other >> Nov 09, 2023 12:30 PM Paige D wrote: Reason for CRM: pt going in for hand surgery wed. Pt is diabetic and realized she can not test her blood sugar with one hand. Pt is asking if she can get a prescription wearable glucose monitor. Insurance says they will cover freestylelibre monitor. Pt is asking if this can be sent over asap as she has surgery wed and needs it by Monday insurance takes they can get it turned over pretty quick as prior auth is urgent. Pt is asking if this can get done today. Please reach out to pt on status update. Call back # [phone]

## 2023-11-09 NOTE — Telephone Encounter (Signed)
 Okay to send freestyle libre.

## 2023-11-11 ENCOUNTER — Other Ambulatory Visit: Payer: Self-pay | Admitting: Nurse Practitioner

## 2023-11-11 DIAGNOSIS — E119 Type 2 diabetes mellitus without complications: Secondary | ICD-10-CM

## 2023-11-12 ENCOUNTER — Other Ambulatory Visit (HOSPITAL_COMMUNITY): Payer: Self-pay

## 2023-11-12 ENCOUNTER — Telehealth: Payer: Self-pay

## 2023-11-12 ENCOUNTER — Encounter: Payer: Self-pay | Admitting: Nurse Practitioner

## 2023-11-12 NOTE — Telephone Encounter (Unsigned)
 Copied from CRM [id number]. Topic: Clinical - Medication Prior Auth >> Nov 12, 2023  1:26 PM Shereese L wrote: Reason for RMF:[id number] is needing an Urgent emergency pre-authorization form sent for Continuous Glucose Sensor (FREESTYLE LIBRE 3 SENSOR) MISC because the patient will be having surgery on her hand on Wednesday and needs it sent today

## 2023-11-12 NOTE — Telephone Encounter (Signed)
 Patient has called 2 x in the past 20 minutes saying she is having surgery on 11/14/23 and that her insurance states if this PA is flagged red Urgent/ STAT that they will approve it now.

## 2023-11-12 NOTE — Telephone Encounter (Signed)
 Pharmacy Patient Advocate Encounter   Received notification from Physician's Office that prior authorization for FreeStyle Libre 3 Sensor is required/requested.   Insurance verification completed.   The patient is insured through [facility] MEDICAID.   Per test claim: PA required; PA submitted to above mentioned insurance via Latent Key/confirmation #/EOC [id number] Status is pending

## 2023-11-12 NOTE — Telephone Encounter (Signed)
 Copied from CRM #[id number]. Topic: Clinical - Medical Advice >> Nov 12, 2023  1:17 PM [name] wrote: Reason for CRM: Patient is calling to confirm a prior Auth was received for her Freestyle monitor sent to [employer].  Bilateral hand surgery Wednesday.    Thank you.

## 2023-11-13 NOTE — Telephone Encounter (Signed)
 Please see message about pt requesting PA for [facility] please include details of pt's condition in pa; thx

## 2023-11-13 NOTE — Telephone Encounter (Signed)
 Please provide pt with the sample of free style fibre.

## 2023-11-13 NOTE — Telephone Encounter (Signed)
 Patient states her Insurance stated that the PA must say that this is an Urgent circumstance due to her having Bilateral Hand Surgery on 11/14/23. The only reason she needs this right now is due to her having Bilateral Hand Surgery. Please make sure this is included in the PA.

## 2023-11-13 NOTE — Telephone Encounter (Signed)
 Pt came into the office and picked up her [facility].

## 2023-11-13 NOTE — Telephone Encounter (Signed)
 Pharmacy Patient Advocate Encounter  Received notification from [facility] MEDICAID that Prior Authorization for  FreeStyle Libre 3 Sensor has been DENIED.  Full denial letter will be uploaded to the media tab. See denial reason below.  You must have insulin -dependent diabetes  PA #/Case ID/Reference #: [id number]

## 2023-11-13 NOTE — Telephone Encounter (Signed)
 Hi those details of her having surgery were included. It was denied because she is not insulin  dependent.

## 2023-11-13 NOTE — Telephone Encounter (Signed)
 Patient is aware that Charanpreet Vincente got her a sample of Freestyle [place] monitor and will be here in about an hour to pick it up.

## 2023-11-13 NOTE — Telephone Encounter (Signed)
 PA request has been Submitted. New Encounter has been or will be created for follow up. For additional info see Pharmacy Prior Auth telephone encounter from 11/12/2023.

## 2023-11-14 DIAGNOSIS — M65321 Trigger finger, right index finger: Secondary | ICD-10-CM | POA: Diagnosis not present

## 2023-11-14 DIAGNOSIS — G5603 Carpal tunnel syndrome, bilateral upper limbs: Secondary | ICD-10-CM | POA: Diagnosis not present

## 2023-11-25 ENCOUNTER — Other Ambulatory Visit: Payer: Self-pay | Admitting: Nurse Practitioner

## 2023-11-25 DIAGNOSIS — E119 Type 2 diabetes mellitus without complications: Secondary | ICD-10-CM

## 2023-11-25 DIAGNOSIS — G4733 Obstructive sleep apnea (adult) (pediatric): Secondary | ICD-10-CM

## 2023-11-27 ENCOUNTER — Ambulatory Visit: Admitting: Dermatology

## 2023-12-12 ENCOUNTER — Other Ambulatory Visit: Payer: Self-pay | Admitting: Nurse Practitioner

## 2023-12-12 DIAGNOSIS — G4733 Obstructive sleep apnea (adult) (pediatric): Secondary | ICD-10-CM

## 2023-12-12 DIAGNOSIS — E119 Type 2 diabetes mellitus without complications: Secondary | ICD-10-CM

## 2023-12-19 ENCOUNTER — Telehealth: Payer: Self-pay

## 2023-12-19 ENCOUNTER — Other Ambulatory Visit: Payer: Self-pay | Admitting: Nurse Practitioner

## 2023-12-19 DIAGNOSIS — E119 Type 2 diabetes mellitus without complications: Secondary | ICD-10-CM

## 2023-12-19 NOTE — Telephone Encounter (Signed)
 I left voicemail for patient letting her know that we have had to reschedule her appointment on 01/24/2024 with Chelsea Aurora, NP, from 3pm to 10:40am.  I asked patient to please let us  know if this does not work with her schedule.  E2C2 - if patient calls back, please assist her with finding a date/time that will work with her schedule.

## 2023-12-20 ENCOUNTER — Other Ambulatory Visit: Payer: Self-pay | Admitting: Nurse Practitioner

## 2023-12-24 ENCOUNTER — Ambulatory Visit: Admitting: Dermatology

## 2023-12-24 DIAGNOSIS — W908XXA Exposure to other nonionizing radiation, initial encounter: Secondary | ICD-10-CM | POA: Diagnosis not present

## 2023-12-24 DIAGNOSIS — L820 Inflamed seborrheic keratosis: Secondary | ICD-10-CM | POA: Diagnosis not present

## 2023-12-24 DIAGNOSIS — L821 Other seborrheic keratosis: Secondary | ICD-10-CM | POA: Diagnosis not present

## 2023-12-24 DIAGNOSIS — L578 Other skin changes due to chronic exposure to nonionizing radiation: Secondary | ICD-10-CM

## 2023-12-24 DIAGNOSIS — Z7189 Other specified counseling: Secondary | ICD-10-CM

## 2023-12-24 DIAGNOSIS — D229 Melanocytic nevi, unspecified: Secondary | ICD-10-CM | POA: Diagnosis not present

## 2023-12-24 DIAGNOSIS — B079 Viral wart, unspecified: Secondary | ICD-10-CM | POA: Diagnosis not present

## 2023-12-24 DIAGNOSIS — Z79899 Other long term (current) drug therapy: Secondary | ICD-10-CM

## 2023-12-24 NOTE — Progress Notes (Unsigned)
 Follow-Up Visit   Subjective  Heather Cain is a 53 y.o. female who presents for the following: wart follow up - persistent, previously tx with LN2 and candida antigen injection and OTC wart remover, pt concerned about a lesion on the L arm and back.  The patient has spots, moles and lesions to be evaluated, some may be new or changing and the patient may have concern these could be cancer.  The following portions of the chart were reviewed this encounter and updated as appropriate: medications, allergies, medical history  Review of Systems:  No other skin or systemic complaints except as noted in HPI or Assessment and Plan.  Objective  Well appearing patient in no apparent distress; mood and affect are within normal limits.   A focused examination was performed of the following areas: the face, hands, and legs   Relevant exam findings are noted in the Assessment and Plan.  right thumb x 2 (2) Verrucous papules -- Discussed viral etiology and contagion.  Back x 24, L post shoulder x 1 (25) Erythematous stuck-on, waxy papule or plaque  Assessment & Plan   SEBORRHEIC KERATOSIS - Stuck-on, waxy, tan-brown papules and/or plaques  - Benign-appearing - Discussed benign etiology and prognosis. - Observe - Call for any changes  ACTINIC DAMAGE - chronic, secondary to cumulative UV radiation exposure/sun exposure over time - diffuse scaly erythematous macules with underlying dyspigmentation - Recommend daily broad spectrum sunscreen SPF 30+ to sun-exposed areas, reapply every 2 hours as needed.  - Recommend staying in the shade or wearing long sleeves, sun glasses (UVA+UVB protection) and wide brim hats (4-inch brim around the entire circumference of the hat). - Call for new or changing lesions.  MELANOCYTIC NEVI Exam: Tan-brown and/or pink-flesh-colored symmetric macules and papules  Treatment Plan: Benign appearing on exam today. Recommend observation. Call clinic for new or  changing moles. Recommend daily use of broad spectrum spf 30+ sunscreen to sun-exposed areas.   VIRAL WARTS, UNSPECIFIED TYPE (2) right thumb x 2 (2) Viral Wart (HPV) Counseling  Discussed viral / HPV (Human Papilloma Virus) etiology and risk of spread /infectivity to other areas of body as well as to other people.  Multiple treatments and methods may be required to clear warts and it is possible treatment may not be successful.  Treatment risks include discoloration; scarring and there is still potential for wart recurrence.  Continue OTC wart remover when no longer peeling and irritated from today's treatment.  - Destruction of lesion - right thumb x 2 (2) Complexity: simple   Destruction method: cryotherapy   Informed consent: discussed and consent obtained   Timeout:  patient name, date of birth, surgical site, and procedure verified Lesion destroyed using liquid nitrogen: Yes   Region frozen until ice ball extended beyond lesion: Yes   Outcome: patient tolerated procedure well with no complications   Post-procedure details: wound care instructions given    - Intralesional injection - right thumb x 2 (2) Location: R thumb  Informed Consent: Discussed risks (infection, pain, bleeding, bruising, thinning of the skin, loss of skin pigment, lack of resolution, and recurrence of lesion) and benefits of the procedure, as well as the alternatives. Informed consent was obtained. Preparation: The area was prepared a standard fashion.  Procedure Details: An intralesional injection was performed with candida antigen. 0.15 cc in total were injected.  Total number of injections: 1  Plan: The patient was instructed on post-op care. Recommend OTC analgesia as needed for pain.  NDC [address]-8 EXP DATE 12/10/2024 LOT# [id number]   INFLAMED SEBORRHEIC KERATOSIS (25) Back x 24, L post shoulder x 1 (25) Symptomatic, irritating, patient would like treated. - Destruction of lesion - Back x 24, L  post shoulder x 1 (25) Complexity: simple   Destruction method: cryotherapy   Informed consent: discussed and consent obtained   Timeout:  patient name, date of birth, surgical site, and procedure verified Lesion destroyed using liquid nitrogen: Yes   Region frozen until ice ball extended beyond lesion: Yes   Outcome: patient tolerated procedure well with no complications   Post-procedure details: wound care instructions given    SEBORRHEIC KERATOSIS   ACTINIC SKIN DAMAGE   MELANOCYTIC NEVUS, UNSPECIFIED LOCATION    Return for wart follow up in 8 weeks.  LILLETTE Rosina Mayans, CMA, am acting as scribe for Alm Rhyme, MD .   Documentation: I have reviewed the above documentation for accuracy and completeness, and I agree with the above.  Alm Rhyme, MD

## 2023-12-24 NOTE — Patient Instructions (Signed)

## 2023-12-25 ENCOUNTER — Encounter: Payer: Self-pay | Admitting: Dermatology

## 2024-01-18 ENCOUNTER — Other Ambulatory Visit: Payer: Self-pay | Admitting: Nurse Practitioner

## 2024-01-18 DIAGNOSIS — E785 Hyperlipidemia, unspecified: Secondary | ICD-10-CM

## 2024-01-24 ENCOUNTER — Ambulatory Visit: Admitting: Nurse Practitioner

## 2024-01-31 ENCOUNTER — Ambulatory Visit: Admitting: Nurse Practitioner

## 2024-01-31 ENCOUNTER — Encounter: Payer: Self-pay | Admitting: Nurse Practitioner

## 2024-01-31 VITALS — BP 126/78 | HR 83 | Temp 97.5°F | Ht 63.0 in | Wt 319.8 lb

## 2024-01-31 DIAGNOSIS — E785 Hyperlipidemia, unspecified: Secondary | ICD-10-CM

## 2024-01-31 DIAGNOSIS — E119 Type 2 diabetes mellitus without complications: Secondary | ICD-10-CM

## 2024-01-31 DIAGNOSIS — Z7985 Long-term (current) use of injectable non-insulin antidiabetic drugs: Secondary | ICD-10-CM | POA: Diagnosis not present

## 2024-01-31 DIAGNOSIS — E1169 Type 2 diabetes mellitus with other specified complication: Secondary | ICD-10-CM | POA: Diagnosis not present

## 2024-01-31 DIAGNOSIS — I480 Paroxysmal atrial fibrillation: Secondary | ICD-10-CM

## 2024-01-31 DIAGNOSIS — Z23 Encounter for immunization: Secondary | ICD-10-CM

## 2024-01-31 MED ORDER — FUROSEMIDE 20 MG PO TABS: 20.0000 mg | ORAL_TABLET | Freq: Every day | ORAL | 3 refills | Status: AC

## 2024-01-31 MED ORDER — ROSUVASTATIN CALCIUM 20 MG PO TABS: 20.0000 mg | ORAL_TABLET | Freq: Every day | ORAL | 0 refills | Status: AC

## 2024-01-31 MED ORDER — FREESTYLE LIBRE 3 SENSOR MISC: 3 refills | Status: AC

## 2024-01-31 MED ORDER — ACCU-CHEK SOFTCLIX LANCETS MISC: 100.0000 | 0 refills | Status: AC

## 2024-01-31 MED ORDER — TIRZEPATIDE 5 MG/0.5ML ~~LOC~~ SOAJ: 5.0000 mg | SUBCUTANEOUS | 1 refills | Status: AC

## 2024-01-31 MED ORDER — ACCU-CHEK GUIDE TEST VI STRP: 1.0000 | ORAL_STRIP | 0 refills | Status: AC

## 2024-02-01 LAB — COMPREHENSIVE METABOLIC PANEL WITH GFR
ALT: 25 U/L (ref 3–35)
AST: 25 U/L (ref 5–37)
Albumin: 4.4 g/dL (ref 3.5–5.2)
Alkaline Phosphatase: 74 U/L (ref 39–117)
BUN: 21 mg/dL (ref 6–23)
CO2: 28 meq/L (ref 19–32)
Calcium: 9.5 mg/dL (ref 8.4–10.5)
Chloride: 104 meq/L (ref 96–112)
Creatinine, Ser: 0.8 mg/dL (ref 0.40–1.20)
GFR: 84.06 mL/min
Glucose, Bld: 118 mg/dL — ABNORMAL HIGH (ref 70–99)
Potassium: 4.1 meq/L (ref 3.5–5.1)
Sodium: 140 meq/L (ref 135–145)
Total Bilirubin: 0.4 mg/dL (ref 0.2–1.2)
Total Protein: 6.9 g/dL (ref 6.0–8.3)

## 2024-02-01 LAB — LIPID PANEL
Cholesterol: 109 mg/dL (ref 28–200)
HDL: 42.2 mg/dL
LDL Cholesterol: 21 mg/dL (ref 10–99)
NonHDL: 66.79
Total CHOL/HDL Ratio: 3
Triglycerides: 227 mg/dL — ABNORMAL HIGH (ref 10.0–149.0)
VLDL: 45.4 mg/dL — ABNORMAL HIGH (ref 0.0–40.0)

## 2024-02-01 LAB — HEMOGLOBIN A1C: Hgb A1c MFr Bld: 7.3 % — ABNORMAL HIGH (ref 4.6–6.5)

## 2024-02-01 LAB — CBC WITH DIFFERENTIAL/PLATELET
Basophils Absolute: 0 K/uL (ref 0.0–0.1)
Basophils Relative: 0.7 % (ref 0.0–3.0)
Eosinophils Absolute: 0.1 K/uL (ref 0.0–0.7)
Eosinophils Relative: 1.4 % (ref 0.0–5.0)
HCT: 40.8 % (ref 36.0–46.0)
Hemoglobin: 13.7 g/dL (ref 12.0–15.0)
Lymphocytes Relative: 33.7 % (ref 12.0–46.0)
Lymphs Abs: 2.2 K/uL (ref 0.7–4.0)
MCHC: 33.5 g/dL (ref 30.0–36.0)
MCV: 91.1 fl (ref 78.0–100.0)
Monocytes Absolute: 0.5 K/uL (ref 0.1–1.0)
Monocytes Relative: 8.2 % (ref 3.0–12.0)
Neutro Abs: 3.6 K/uL (ref 1.4–7.7)
Neutrophils Relative %: 56 % (ref 43.0–77.0)
Platelets: 199 K/uL (ref 150.0–400.0)
RBC: 4.48 Mil/uL (ref 3.87–5.11)
RDW: 15.4 % (ref 11.5–15.5)
WBC: 6.4 K/uL (ref 4.0–10.5)

## 2024-02-04 NOTE — Progress Notes (Signed)
 "  Established Patient Office Visit  Subjective:  Patient ID: Heather Cain, female    DOB: 11/05/1970  Age: 54 y.o. MRN: [id number]  CC:  Chief Complaint  Patient presents with   Medical Management of Chronic Issues   Discussed the use of AI scribe software for clinical note transcription with the patient, who gave verbal consent to proceed.  History of Present Illness   Heather Cain is a 54 year old female who presents for chronic disease follow-up.    She underwent carpal tunnel and trigger finger surgery and now cannot fully extend her first finger, with poor grip strength. She has a painful, persistently swollen nodular scar at the surgical site and was told she may have a genetic predisposition to Dupuytren's contracture.  Her postoperative hand limitations have reduced her usual water  walking, and her weight has increased from 316 to 319 pounds. She has recently restarted water  walking three times weekly to support weight management.  Her medications include Mounjaro , recently increased to 5 mg after being on a lower dose for two months, gabapentin , albuterol  as needed, Eliquis , furosemide , glimepiride , metoprolol  25 mg, and a cholesterol medication 20 mg. Her morning blood sugars are typically 115 to 130.  Reviewed labs.   She is not driving due to difficulty gripping the steering wheel for long periods because of her hand symptoms.    Past Medical History:  Diagnosis Date   DCIS (ductal carcinoma in situ) of breast    Diabetes mellitus without complication (HCC)    Family history of bladder cancer    Family history of uterine cancer    High cholesterol    Neuromuscular disorder (HCC)    peripheral neuropathy   PONV (postoperative nausea and vomiting)    Vaginal Pap smear, abnormal     Past Surgical History:  Procedure Laterality Date   BREAST BIOPSY Right 03/08/2021   right breast bx coil clip -positive   BREAST BIOPSY Right 03/24/2021   stereo biopsy x  clip/DUCTAL CARCINOMA IN SITU (DCIS),   BREAST BIOPSY Left 05/18/2021   ribbon clip   BREAST BIOPSY Right 05/18/2021   coil clip   COLONOSCOPY WITH PROPOFOL  N/A 03/08/2022   Procedure: COLONOSCOPY WITH PROPOFOL ;  Surgeon: Therisa Bi, MD;  Location: [facility] ENDOSCOPY;  Service: Gastroenterology;  Laterality: N/A;  REQUESTED LAST CASE   CRYOTHERAPY     EVACUATION BREAST HEMATOMA Right 06/16/2021   Procedure: EVACUATION HEMATOMA BREAST;  Surgeon: Lane Shope, MD;  Location: [facility] ORS;  Service: General;  Laterality: Right;   MASTECTOMY     SIMPLE MASTECTOMY WITH AXILLARY SENTINEL NODE BIOPSY Right 06/15/2021   Procedure: SIMPLE MASTECTOMY WITH AXILLARY SENTINEL NODE BIOPSY, Total;  Surgeon: Lane Shope, MD;  Location: [facility] ORS;  Service: General;  Laterality: Right;   TOTAL MASTECTOMY Left 06/15/2021   Procedure: TOTAL MASTECTOMY, prophylactic left;  Surgeon: Lane Shope, MD;  Location: [facility] ORS;  Service: General;  Laterality: Left;   WRIST SURGERY      Family History  Problem Relation Age of Onset   Uterine cancer Mother 72   Bladder Cancer Father 9   Skin cancer Maternal Aunt    Cancer - Other Maternal Grandfather        possible colon cancer   Breast cancer Maternal Great-grandmother     Social History   Socioeconomic History   Marital status: Single    Spouse name: Not on file   Number of children: Not on file   Years of education: Not  on file   Highest education level: Some college, no degree  Occupational History   Not on file  Tobacco Use   Smoking status: Former    Current packs/day: 0.00    Average packs/day: 1.0 packs/day    Types: Cigarettes    Quit date: 05/30/2021    Years since quitting: 2.6   Smokeless tobacco: Never  Vaping Use   Vaping status: Some Days  Substance and Sexual Activity   Alcohol use: Yes    Comment: social   Drug use: Yes    Types: Marijuana   Sexual activity: Not Currently    Birth control/protection: None  Other Topics  Concern   Not on file  Social History Narrative   Lives in [place]; self; no children; 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 former Smoke ; rare alcohol.    Social Drivers of Health   Tobacco Use: Medium Risk (01/31/2024)   Patient History    Smoking Tobacco Use: Former    Smokeless Tobacco Use: Never    Passive Exposure: Not on file  Financial Resource Strain: Medium Risk (01/27/2024)   Overall Financial Resource Strain (CARDIA)    Difficulty of Paying Living Expenses: Somewhat hard  Food Insecurity: No Food Insecurity (01/27/2024)   Epic    Worried About Programme Researcher, Broadcasting/film/video in the Last Year: Never true    Ran Out of Food in the Last Year: Never true  Transportation Needs: No Transportation Needs (01/27/2024)   Epic    Lack of Transportation (Medical): No    Lack of Transportation (Non-Medical): No  Physical Activity: Sufficiently Active (01/27/2024)   Exercise Vital Sign    Days of Exercise per Week: 3 days    Minutes of Exercise per Session: 90 min  Stress: Stress Concern Present (01/27/2024)   [employer] of Occupational Health - Occupational Stress Questionnaire    Feeling of Stress: To some extent  Social Connections: Socially Isolated (01/27/2024)   Social Connection and Isolation Panel    Frequency of Communication with Friends and Family: More than three times a week    Frequency of Social Gatherings with Friends and Family: Once a week    Attends Religious Services: Never    Database Administrator or Organizations: No    Attends Engineer, Structural: Not on file    Marital Status: Never married  Intimate Partner Violence: Not At Risk (03/16/2022)   Humiliation, Afraid, Rape, and Kick questionnaire    Fear of Current or Ex-Partner: No    Emotionally Abused: No    Physically Abused: No    Sexually Abused: No  Depression (PHQ2-9): Medium Risk (09/21/2023)   Depression (PHQ2-9)    PHQ-2 Score: 7  Alcohol Screen: Low Risk (01/27/2024)   Alcohol Screen    Last Alcohol Screening  Score (AUDIT): 2  Housing: High Risk (01/27/2024)   Epic    Unable to Pay for Housing in the Last Year: Yes    Number of Times Moved in the Last Year: Not on file    Homeless in the Last Year: No  Utilities: Not At Risk (10/15/2023)   Received from [facility]   Epic    In the past 12 months has the electric, gas, oil, or water  company threatened to shut off services in your home?: No  Health Literacy: Adequate Health Literacy (10/15/2023)   Received from [facility]   B1300 Health Literacy    How often do you need to have someone help you when  you read instructions, pamphlets, or other written material from your doctor or pharmacy?: Never     Outpatient Medications Prior to Visit  Medication Sig Dispense Refill   albuterol  (VENTOLIN  HFA) 108 (90 Base) MCG/ACT inhaler Inhale 2 puffs into the lungs every 6 (six) hours as needed for wheezing or shortness of breath. 8 g 2   apixaban  (ELIQUIS ) 5 MG TABS tablet Take 1 tablet by mouth twice daily 180 tablet 1   Dietary Management Product (VASCULERA) TABS Take 1 tab po qd 30 tablet 6   fluticasone  (FLONASE ) 50 MCG/ACT nasal spray Use 2 spray(s) in each nostril once daily 16 g 3   gabapentin  (NEURONTIN ) 100 MG capsule 200 mg 2 (two) times daily.     glimepiride  (AMARYL ) 4 MG tablet TAKE 1 TABLET BY MOUTH ONCE DAILY BEFORE BREAKFAST 90 tablet 3   hydrocortisone  2.5 % cream apply to face once a day Tues, Thurs, Sat 30 g 11   ketoconazole  (NIZORAL ) 2 % cream Apply to aa's face QD on Monday, Wednesday, and Friday. 60 g 5   loratadine  (CLARITIN ) 10 MG tablet Take 10 mg by mouth daily.     meclizine  (ANTIVERT ) 25 MG tablet Take 1 tablet (25 mg total) by mouth 3 (three) times daily as needed. 30 tablet 0   metoprolol  succinate (TOPROL -XL) 25 MG 24 hr tablet TAKE 1 TABLET BY MOUTH AT BEDTIME 90 tablet 3   mometasone  (ELOCON ) 0.1 % cream apply to legs 5 nights a week 45 g 6   NEOMYCIN -POLYMYXIN-HYDROCORTISONE   (CORTISPORIN) 1 % SOLN OTIC solution 4 gtt in affected ear(s) tid, max of 10 days. Lie with affected ear upward x 5 minutes 10 mL 0   ondansetron  (ZOFRAN -ODT) 4 MG disintegrating tablet Take 1 tablet (4 mg total) by mouth every 6 (six) hours as needed for nausea or vomiting. 20 tablet 0   Salicylic Acid (NIZORAL  PSORIASIS SHAMPOO/COND) 3 % SHAM Apply topically.     MOUNJARO  2.5 MG/0.5ML Pen INJECT  2.5 MG SUBCUTANEOUSLY ONCE A WEEK 4 mL 0   ACCU-CHEK GUIDE TEST test strip USE AS DIRECTED 50 each 0   Accu-Chek Softclix Lancets lancets USE AS DIRECTED 100 each 0   Continuous Glucose Sensor (FREESTYLE LIBRE 3 SENSOR) MISC Place 1 sensor on the skin every 14 days. Use to check glucose continuously 2 each 3   furosemide  (LASIX ) 20 MG tablet Take 1 tablet by mouth once daily 30 tablet 3   rosuvastatin  (CRESTOR ) 20 MG tablet Take 1 tablet by mouth once daily 30 tablet 0   tirzepatide  (MOUNJARO ) 5 MG/0.5ML Pen Inject 5 mg into the skin once a week. 6 mL 1   No facility-administered medications prior to visit.    Allergies[1]  ROS Review of Systems Negative unless indicated in HPI.    Objective:    Physical Exam Constitutional:      Appearance: Normal appearance.  HENT:     Mouth/Throat:     Mouth: Mucous membranes are moist.  Eyes:     Conjunctiva/sclera: Conjunctivae normal.     Pupils: Pupils are equal, round, and reactive to light.  Cardiovascular:     Rate and Rhythm: Normal rate and regular rhythm.     Pulses: Normal pulses.     Heart sounds: Normal heart sounds.  Pulmonary:     Effort: Pulmonary effort is normal.     Breath sounds: Normal breath sounds.  Abdominal:     General: Bowel sounds are normal.     Palpations: Abdomen  is soft.  Musculoskeletal:     Cervical back: Normal range of motion. No tenderness.  Skin:    General: Skin is warm.     Findings: No bruising.  Neurological:     General: No focal deficit present.     Mental Status: She is alert and oriented to  person, place, and time. Mental status is at baseline.  Psychiatric:        Mood and Affect: Mood normal.        Behavior: Behavior normal.        Thought Content: Thought content normal.        Judgment: Judgment normal.     BP 126/78   Pulse 83   Temp (!) 97.5 F (36.4 C)   Ht 5' 3 (1.6 m)   Wt (!) 319 lb 12.8 oz (145.1 kg)   SpO2 95%   BMI 56.65 kg/m  Wt Readings from Last 3 Encounters:  01/31/24 (!) 319 lb 12.8 oz (145.1 kg)  09/21/23 (!) 316 lb 3.2 oz (143.4 kg)  09/06/23 (!) 322 lb (146.1 kg)     Health Maintenance  Topic Date Due   HIV Screening  Never done   Hepatitis C Screening  Never done   Hepatitis B Vaccines 19-59 Average Risk (1 of 3 - 19+ 3-dose series) Never done   Mammogram  04/28/2022   OPHTHALMOLOGY EXAM  08/04/2023   COVID-19 Vaccine (5 - 2025-26 season) 09/10/2023   Diabetic kidney evaluation - Urine ACR  06/14/2024   FOOT EXAM  06/14/2024   HEMOGLOBIN A1C  07/30/2024   Diabetic kidney evaluation - eGFR measurement  01/30/2025   Colonoscopy  03/08/2025   Cervical Cancer Screening (HPV/Pap Cotest)  04/16/2028   DTaP/Tdap/Td (3 - Td or Tdap) 02/11/2032   Pneumococcal Vaccine: 50+ Years  Completed   Influenza Vaccine  Completed   HPV VACCINES (No Doses Required) Completed   Zoster Vaccines- Shingrix   Completed   Meningococcal B Vaccine  Aged Out       Topic Date Due   Hepatitis B Vaccines 19-59 Average Risk (1 of 3 - 19+ 3-dose series) Never done    Lab Results  Component Value Date   TSH 2.490 09/21/2023   Lab Results  Component Value Date   WBC 6.4 01/31/2024   HGB 13.7 01/31/2024   HCT 40.8 01/31/2024   MCV 91.1 01/31/2024   PLT 199.0 01/31/2024   Lab Results  Component Value Date   NA 140 01/31/2024   K 4.1 01/31/2024   CO2 28 01/31/2024   GLUCOSE 118 (H) 01/31/2024   BUN 21 01/31/2024   CREATININE 0.80 01/31/2024   BILITOT 0.4 01/31/2024   ALKPHOS 74 01/31/2024   AST 25 01/31/2024   ALT 25 01/31/2024   PROT 6.9  01/31/2024   ALBUMIN  4.4 01/31/2024   CALCIUM  9.5 01/31/2024   ANIONGAP 8 06/18/2023   EGFR 91 12/15/2022   GFR 84.06 01/31/2024   Lab Results  Component Value Date   CHOL 109 01/31/2024   Lab Results  Component Value Date   HDL 42.20 01/31/2024   Lab Results  Component Value Date   LDLCALC 21 01/31/2024   Lab Results  Component Value Date   TRIG 227.0 (H) 01/31/2024   Lab Results  Component Value Date   CHOLHDL 3 01/31/2024   Lab Results  Component Value Date   HGBA1C 7.3 (H) 01/31/2024      Assessment & Plan:   Assessment & Plan Hyperlipidemia, unspecified hyperlipidemia  type Chronic on statin therapy. -Continue Crestor  20 mg. -Will check lipid panel.  Orders:   rosuvastatin  (CRESTOR ) 20 MG tablet; Take 1 tablet (20 mg total) by mouth daily.   Lipid panel  Type 2 diabetes mellitus with other specified complication, without long-term current use of insulin  (HCC) Chronic on medication. -Continue Mounjaro  5 mg weekly and glimepiride  4 mg daily. - Will check A1c  Orders:   glucose blood (ACCU-CHEK GUIDE TEST) test strip; 1 each by Other route See admin instructions.   Accu-Chek Softclix Lancets lancets; 100 each by Other route See admin instructions.   CBC with Differential/Platelet   Comprehensive metabolic panel with GFR   Hemoglobin A1c  Type 2 diabetes mellitus without complication, without long-term current use of insulin  (HCC) Chronic on medication. -Continue Mounjaro  5 mg weekly and glimepiride  4 mg daily. - Will check A1c     Need for vaccination against Streptococcus pneumoniae  Orders:   Pneumococcal conjugate vaccine 20-valent (PCV20)  Immunization due  Orders:   Flu vaccine trivalent PF, 6mos and older(Flulaval,Afluria,Fluarix,Fluzone)  Morbid obesity (HCC) Body mass index is 56.65 kg/m. - Continue Mounjaro  5 mg weekly. -Continue exercise and diet management.     Paroxysmal A-fib (HCC) Chronic on Eliquis . -Followed by  cardiology       Immunization management Due for vaccines and screenings. - Administered pneumonia and flu vaccines.   Follow-up: No follow-ups on file.   Verlie Liotta, NP    [1]  Allergies Allergen Reactions   Codeine Itching and Anaphylaxis   "

## 2024-02-04 NOTE — Assessment & Plan Note (Signed)
 Chronic on medication. -Continue Mounjaro  5 mg weekly and glimepiride  4 mg daily. - Will check A1c

## 2024-02-04 NOTE — Assessment & Plan Note (Addendum)
 Chronic on statin therapy. -Continue Crestor  20 mg. -Will check lipid panel.  Orders:   rosuvastatin  (CRESTOR ) 20 MG tablet; Take 1 tablet (20 mg total) by mouth daily.   Lipid panel

## 2024-02-04 NOTE — Assessment & Plan Note (Addendum)
 Chronic on medication. -Continue Mounjaro  5 mg weekly and glimepiride  4 mg daily. - Will check A1c  Orders:   glucose blood (ACCU-CHEK GUIDE TEST) test strip; 1 each by Other route See admin instructions.   Accu-Chek Softclix Lancets lancets; 100 each by Other route See admin instructions.   CBC with Differential/Platelet   Comprehensive metabolic panel with GFR   Hemoglobin A1c

## 2024-02-04 NOTE — Assessment & Plan Note (Addendum)
 Body mass index is 56.65 kg/m. - Continue Mounjaro  5 mg weekly. -Continue exercise and diet management.

## 2024-02-04 NOTE — Assessment & Plan Note (Addendum)
 Chronic on Eliquis . -Followed by cardiology

## 2024-02-06 ENCOUNTER — Ambulatory Visit: Payer: Self-pay | Admitting: Nurse Practitioner

## 2024-02-18 ENCOUNTER — Ambulatory Visit: Admitting: Dermatology

## 2024-05-21 ENCOUNTER — Ambulatory Visit: Admitting: Dermatology

## 2024-07-31 ENCOUNTER — Ambulatory Visit: Admitting: Nurse Practitioner
